# Patient Record
Sex: Female | Born: 1950 | Race: White | Hispanic: No | Marital: Single | Smoking: Never smoker
Health system: Southern US, Community
[De-identification: ages and names within clinical notes are randomized; demographics above are authoritative.]

## PROBLEM LIST (undated history)

## (undated) DIAGNOSIS — H269 Unspecified cataract: Secondary | ICD-10-CM

## (undated) DIAGNOSIS — IMO0002 Reserved for concepts with insufficient information to code with codable children: Secondary | ICD-10-CM

## (undated) DIAGNOSIS — E785 Hyperlipidemia, unspecified: Secondary | ICD-10-CM

## (undated) DIAGNOSIS — E079 Disorder of thyroid, unspecified: Secondary | ICD-10-CM

## (undated) DIAGNOSIS — Z85828 Personal history of other malignant neoplasm of skin: Secondary | ICD-10-CM

## (undated) DIAGNOSIS — C801 Malignant (primary) neoplasm, unspecified: Secondary | ICD-10-CM

## (undated) DIAGNOSIS — R112 Nausea with vomiting, unspecified: Secondary | ICD-10-CM

## (undated) DIAGNOSIS — I10 Essential (primary) hypertension: Secondary | ICD-10-CM

## (undated) DIAGNOSIS — K219 Gastro-esophageal reflux disease without esophagitis: Secondary | ICD-10-CM

## (undated) DIAGNOSIS — Z9889 Other specified postprocedural states: Secondary | ICD-10-CM

## (undated) HISTORY — DX: Hyperlipidemia, unspecified: E78.5

## (undated) HISTORY — DX: Unspecified cataract: H26.9

## (undated) HISTORY — DX: Malignant (primary) neoplasm, unspecified: C80.1

## (undated) HISTORY — PX: CHOLECYSTECTOMY: SHX55

## (undated) HISTORY — PX: EYE SURGERY: SHX253

## (undated) HISTORY — PX: KNEE SURGERY: SHX244

## (undated) HISTORY — DX: Personal history of other malignant neoplasm of skin: Z85.828

## (undated) HISTORY — DX: Reserved for concepts with insufficient information to code with codable children: IMO0002

## (undated) HISTORY — DX: Disorder of thyroid, unspecified: E07.9

---

## 1988-08-04 HISTORY — PX: SHOULDER SURGERY: SHX246

## 1998-08-04 HISTORY — PX: GALLBLADDER SURGERY: SHX652

## 2004-12-19 ENCOUNTER — Ambulatory Visit: Payer: Self-pay

## 2005-02-12 ENCOUNTER — Ambulatory Visit: Payer: Self-pay

## 2005-12-23 ENCOUNTER — Ambulatory Visit: Payer: Self-pay

## 2006-02-25 ENCOUNTER — Ambulatory Visit: Payer: Self-pay

## 2006-04-27 ENCOUNTER — Ambulatory Visit: Payer: Self-pay | Admitting: Gastroenterology

## 2006-12-23 ENCOUNTER — Ambulatory Visit: Payer: Self-pay

## 2007-03-01 ENCOUNTER — Ambulatory Visit: Payer: Self-pay

## 2008-03-02 ENCOUNTER — Ambulatory Visit: Payer: Self-pay | Admitting: Family Medicine

## 2008-03-07 ENCOUNTER — Ambulatory Visit: Payer: Self-pay | Admitting: Family Medicine

## 2008-09-05 ENCOUNTER — Ambulatory Visit: Payer: Self-pay | Admitting: Family Medicine

## 2008-10-05 ENCOUNTER — Ambulatory Visit: Payer: Self-pay | Admitting: Family Medicine

## 2008-11-20 ENCOUNTER — Ambulatory Visit: Payer: Self-pay | Admitting: Obstetrics and Gynecology

## 2008-12-01 ENCOUNTER — Ambulatory Visit: Payer: Self-pay | Admitting: General Practice

## 2008-12-29 ENCOUNTER — Ambulatory Visit: Payer: Self-pay | Admitting: General Practice

## 2009-01-18 ENCOUNTER — Encounter: Payer: Self-pay | Admitting: General Practice

## 2009-02-01 ENCOUNTER — Encounter: Payer: Self-pay | Admitting: General Practice

## 2009-03-04 ENCOUNTER — Encounter: Payer: Self-pay | Admitting: General Practice

## 2009-03-08 ENCOUNTER — Other Ambulatory Visit: Payer: Self-pay | Admitting: Family Medicine

## 2009-03-08 ENCOUNTER — Ambulatory Visit: Payer: Self-pay | Admitting: Family Medicine

## 2009-08-04 DIAGNOSIS — C801 Malignant (primary) neoplasm, unspecified: Secondary | ICD-10-CM

## 2009-08-04 HISTORY — PX: THYROIDECTOMY: SHX17

## 2009-08-04 HISTORY — DX: Malignant (primary) neoplasm, unspecified: C80.1

## 2009-09-08 ENCOUNTER — Other Ambulatory Visit: Payer: Self-pay | Admitting: Family Medicine

## 2009-09-10 ENCOUNTER — Ambulatory Visit: Payer: Self-pay | Admitting: Family Medicine

## 2009-10-09 ENCOUNTER — Other Ambulatory Visit: Payer: Self-pay | Admitting: Family Medicine

## 2009-10-17 ENCOUNTER — Other Ambulatory Visit: Payer: Self-pay | Admitting: Endocrinology

## 2009-11-16 ENCOUNTER — Other Ambulatory Visit: Payer: Self-pay | Admitting: Endocrinology

## 2009-12-19 ENCOUNTER — Ambulatory Visit: Payer: Self-pay | Admitting: Otolaryngology

## 2010-01-29 ENCOUNTER — Other Ambulatory Visit: Payer: Self-pay | Admitting: Endocrinology

## 2010-03-12 ENCOUNTER — Ambulatory Visit: Payer: Self-pay | Admitting: Family Medicine

## 2010-03-22 ENCOUNTER — Other Ambulatory Visit: Payer: Self-pay | Admitting: Family Medicine

## 2010-09-11 ENCOUNTER — Other Ambulatory Visit: Payer: Self-pay | Admitting: Family Medicine

## 2011-01-28 ENCOUNTER — Other Ambulatory Visit: Payer: Self-pay | Admitting: Endocrinology

## 2011-03-12 ENCOUNTER — Other Ambulatory Visit: Payer: Self-pay | Admitting: Family Medicine

## 2011-03-19 ENCOUNTER — Ambulatory Visit: Payer: Self-pay | Admitting: Family Medicine

## 2011-03-19 LAB — HM MAMMOGRAPHY: HM Mammogram: NORMAL

## 2011-05-22 ENCOUNTER — Other Ambulatory Visit: Payer: Self-pay | Admitting: Family Medicine

## 2011-10-18 LAB — HM PAP SMEAR: HM Pap smear: NORMAL

## 2011-11-25 ENCOUNTER — Other Ambulatory Visit: Payer: Self-pay | Admitting: Family Medicine

## 2011-11-25 LAB — COMPREHENSIVE METABOLIC PANEL
Albumin: 3.9 g/dL (ref 3.4–5.0)
Anion Gap: 7 (ref 7–16)
BUN: 17 mg/dL (ref 7–18)
Bilirubin,Total: 0.4 mg/dL (ref 0.2–1.0)
Chloride: 105 mmol/L (ref 98–107)
Co2: 29 mmol/L (ref 21–32)
Creatinine: 0.72 mg/dL (ref 0.60–1.30)
EGFR (African American): >60
EGFR (Non-African Amer.): >60
Glucose: 98 mg/dL (ref 65–99)
Osmolality: 283 (ref 275–301)
Total Protein: 7.2 g/dL (ref 6.4–8.2)

## 2011-11-25 LAB — LIPID PANEL
Cholesterol: 135 mg/dL (ref 0–200)
HDL Cholesterol: 40 mg/dL (ref 40–60)
Ldl Cholesterol, Calc: 73 mg/dL (ref 0–100)
Triglycerides: 109 mg/dL (ref 0–200)
VLDL Cholesterol, Calc: 22 mg/dL (ref 5–40)

## 2011-11-25 LAB — IRON AND TIBC
Iron Bind.Cap.(Total): 297 ug/dL (ref 250–450)
Iron Saturation: 30 %
Iron: 88 ug/dL (ref 50–170)
Unbound Iron-Bind.Cap.: 209 ug/dL

## 2011-11-25 LAB — CBC WITH DIFFERENTIAL/PLATELET
Basophil %: 0.8 %
Eosinophil #: 0.5 10*3/uL (ref 0.0–0.7)
Eosinophil %: 6 %
HGB: 13.8 g/dL (ref 12.0–16.0)
Monocyte #: 0.5 x10 3/mm (ref 0.2–0.9)
Monocyte %: 6.1 %
Neutrophil #: 4.6 10*3/uL (ref 1.4–6.5)
Neutrophil %: 57.4 %

## 2011-11-25 LAB — FOLATE: Folic Acid: 54.2 ng/mL (ref 3.1–100.0)

## 2012-01-22 ENCOUNTER — Other Ambulatory Visit: Payer: Self-pay | Admitting: Endocrinology

## 2012-01-22 LAB — TSH: Thyroid Stimulating Horm: 1.07 u[IU]/mL

## 2012-01-22 LAB — T4, FREE: Free Thyroxine: 0.95 ng/dL (ref 0.76–1.46)

## 2012-04-19 ENCOUNTER — Encounter: Payer: Self-pay | Admitting: Internal Medicine

## 2012-04-19 ENCOUNTER — Ambulatory Visit (INDEPENDENT_AMBULATORY_CARE_PROVIDER_SITE_OTHER): Payer: PRIVATE HEALTH INSURANCE | Admitting: Internal Medicine

## 2012-04-19 VITALS — BP 122/64 | HR 88 | Temp 99.1°F | Ht 63.0 in | Wt 142.5 lb

## 2012-04-19 DIAGNOSIS — K219 Gastro-esophageal reflux disease without esophagitis: Secondary | ICD-10-CM | POA: Insufficient documentation

## 2012-04-19 DIAGNOSIS — E785 Hyperlipidemia, unspecified: Secondary | ICD-10-CM | POA: Insufficient documentation

## 2012-04-19 DIAGNOSIS — E039 Hypothyroidism, unspecified: Secondary | ICD-10-CM

## 2012-04-19 MED ORDER — LANSOPRAZOLE 30 MG PO CPDR: 30.0000 mg | DELAYED_RELEASE_CAPSULE | Freq: Every day | ORAL | Status: DC

## 2012-04-19 MED ORDER — AMITRIPTYLINE HCL 50 MG PO TABS: 50.0000 mg | ORAL_TABLET | Freq: Every day | ORAL | Status: DC

## 2012-04-19 MED ORDER — ATORVASTATIN CALCIUM 10 MG PO TABS: 10.0000 mg | ORAL_TABLET | Freq: Every day | ORAL | Status: DC

## 2012-04-19 NOTE — Assessment & Plan Note (Signed)
Will check fasting lipids and LFTs with labs. Follow up 6 months and prn.

## 2012-04-19 NOTE — Assessment & Plan Note (Signed)
Symptoms well controlled on Prevacid. Will continue. Follow up 6 months and prn.

## 2012-04-19 NOTE — Progress Notes (Signed)
Subjective:    Patient ID: Erin [name], female    DOB: 1951/06/23, 61 y.o.   MRN: [id number]  HPI 61 year old female with history of hyperlipidemia and hypothyroidism presents to establish care. She reports she is generally feeling well. She reports full compliance with her medications. She notes that she is due for labs to check cholesterol and liver function. She has her thyroid function followed by her endocrinologist. She denies any new concerns today.   Outpatient Encounter Prescriptions as of 04/19/2012  Medication Sig Dispense Refill  . amitriptyline (ELAVIL) 50 MG tablet Take 1 tablet (50 mg total) by mouth at bedtime.  90 tablet  4  . atorvastatin (LIPITOR) 10 MG tablet Take 1 tablet (10 mg total) by mouth daily.  90 tablet  4  . calcium carbonate (OS-CAL) 600 MG TABS Take 600 mg by mouth 2 (two) times daily with a meal.      . cholecalciferol (VITAMIN D) 1000 UNITS tablet Take 1,000 Units by mouth daily.      . Cinnamon 500 MG capsule Take 500 mg by mouth daily.      . fish oil-omega-3 fatty acids 1000 MG capsule Take 2 g by mouth daily.      . lansoprazole (PREVACID) 30 MG capsule Take 1 capsule (30 mg total) by mouth daily.  90 capsule  4  . levothyroxine (SYNTHROID, LEVOTHROID) 88 MCG tablet Take 88 mcg by mouth daily.      . Multiple Vitamins-Minerals (CENTRUM SILVER PO) Take by mouth.       BP 122/64  Pulse 88  Temp 99.1 F (37.3 C) (Oral)  Ht 5\' 3"  (1.6 m)  Wt 142 lb 8 oz (64.638 kg)  BMI 25.24 kg/m2  SpO2 97%  Review of Systems  Constitutional: Negative for fever, chills, appetite change, fatigue and unexpected weight change.  HENT: Negative for ear pain, congestion, sore throat, trouble swallowing, neck pain, voice change and sinus pressure.   Eyes: Negative for visual disturbance.  Respiratory: Negative for cough, shortness of breath, wheezing and stridor.   Cardiovascular: Negative for chest pain, palpitations and leg swelling.  Gastrointestinal: Negative for  nausea, vomiting, abdominal pain, diarrhea, constipation, blood in stool, abdominal distention and anal bleeding.  Genitourinary: Negative for dysuria and flank pain.  Musculoskeletal: Negative for myalgias, arthralgias and gait problem.  Skin: Negative for color change and rash.  Neurological: Negative for dizziness and headaches.  Hematological: Negative for adenopathy. Does not bruise/bleed easily.  Psychiatric/Behavioral: Negative for suicidal ideas, disturbed wake/sleep cycle and dysphoric mood. The patient is not nervous/anxious.        Objective:   Physical Exam  Constitutional: She is oriented to person, place, and time. She appears well-developed and well-nourished. No distress.  HENT:  Head: Normocephalic and atraumatic.  Right Ear: External ear normal.  Left Ear: External ear normal.  Nose: Nose normal.  Mouth/Throat: Oropharynx is clear and moist. No oropharyngeal exudate.  Eyes: Conjunctivae normal are normal. Pupils are equal, round, and reactive to light. Right eye exhibits no discharge. Left eye exhibits no discharge. No scleral icterus.  Neck: Normal range of motion. Neck supple. No tracheal deviation present. No thyromegaly present.  Cardiovascular: Normal rate, regular rhythm, normal heart sounds and intact distal pulses.  Exam reveals no gallop and no friction rub.   No murmur heard. Pulmonary/Chest: Effort normal and breath sounds normal. No respiratory distress. She has no wheezes. She has no rales. She exhibits no tenderness.  Musculoskeletal: Normal range of motion. She  exhibits no edema and no tenderness.  Lymphadenopathy:    She has no cervical adenopathy.  Neurological: She is alert and oriented to person, place, and time. No cranial nerve deficit. She exhibits normal muscle tone. Coordination normal.  Skin: Skin is warm and dry. No rash noted. She is not diaphoretic. No erythema. No pallor.  Psychiatric: She has a normal mood and affect. Her behavior is  normal. Judgment and thought content normal.          Assessment & Plan:

## 2012-04-19 NOTE — Assessment & Plan Note (Signed)
Will check TSH with labs. Continue levothyroxine. 

## 2012-04-20 ENCOUNTER — Other Ambulatory Visit: Payer: Self-pay | Admitting: Internal Medicine

## 2012-04-20 LAB — CBC WITH DIFFERENTIAL/PLATELET
Basophil %: 0.9 %
Eosinophil %: 4.8 %
HCT: 40.9 % (ref 35.0–47.0)
HGB: 14.2 g/dL (ref 12.0–16.0)
Lymphocyte #: 2.5 10*3/uL (ref 1.0–3.6)
MCH: 31.3 pg (ref 26.0–34.0)
MCV: 90 fL (ref 80–100)
Monocyte #: 0.6 x10 3/mm (ref 0.2–0.9)
Monocyte %: 7 %
Neutrophil #: 4.9 10*3/uL (ref 1.4–6.5)
RBC: 4.52 10*6/uL (ref 3.80–5.20)
WBC: 8.4 10*3/uL (ref 3.6–11.0)

## 2012-04-20 LAB — COMPREHENSIVE METABOLIC PANEL
Alkaline Phosphatase: 76 U/L (ref 50–136)
Anion Gap: 5 — ABNORMAL LOW (ref 7–16)
BUN: 17 mg/dL (ref 7–18)
Bilirubin,Total: 0.4 mg/dL (ref 0.2–1.0)
Chloride: 106 mmol/L (ref 98–107)
Co2: 30 mmol/L (ref 21–32)
EGFR (African American): >60
Glucose: 104 mg/dL — ABNORMAL HIGH (ref 65–99)
Osmolality: 283 (ref 275–301)
SGPT (ALT): 44 U/L (ref 12–78)
Total Protein: 7.4 g/dL (ref 6.4–8.2)

## 2012-04-20 LAB — TSH: Thyroid Stimulating Horm: 1.49 u[IU]/mL

## 2012-04-21 ENCOUNTER — Telehealth: Payer: Self-pay | Admitting: Internal Medicine

## 2012-04-21 NOTE — Telephone Encounter (Signed)
Remainder of labs were normal including blood counts, kidney and liver function and cholesterol

## 2012-04-21 NOTE — Telephone Encounter (Signed)
Left message on cell phone voicemail for patient to return call. 

## 2012-04-21 NOTE — Telephone Encounter (Signed)
Vit D and thyroid function were normal (from [facility])

## 2012-04-22 NOTE — Telephone Encounter (Signed)
Patient advised as instructed via telephone. 

## 2012-04-22 NOTE — Telephone Encounter (Signed)
Left message on machine at home and on cell phone voicemail for patient to return call. 

## 2012-04-23 ENCOUNTER — Encounter: Payer: Self-pay | Admitting: Internal Medicine

## 2012-04-26 ENCOUNTER — Ambulatory Visit: Payer: Self-pay | Admitting: Internal Medicine

## 2012-04-27 ENCOUNTER — Encounter: Payer: Self-pay | Admitting: Internal Medicine

## 2012-04-27 ENCOUNTER — Ambulatory Visit (INDEPENDENT_AMBULATORY_CARE_PROVIDER_SITE_OTHER): Payer: PRIVATE HEALTH INSURANCE | Admitting: Internal Medicine

## 2012-04-27 VITALS — BP 110/82 | HR 76 | Temp 97.6°F | Ht 63.0 in | Wt 139.0 lb

## 2012-04-27 DIAGNOSIS — B029 Zoster without complications: Secondary | ICD-10-CM

## 2012-04-27 MED ORDER — VALACYCLOVIR HCL 1 G PO TABS: 1000.0000 mg | ORAL_TABLET | Freq: Three times a day (TID) | ORAL | Status: DC

## 2012-04-27 MED ORDER — GABAPENTIN 100 MG PO CAPS: 100.0000 mg | ORAL_CAPSULE | Freq: Three times a day (TID) | ORAL | Status: DC

## 2012-04-27 MED ORDER — OXYCODONE-ACETAMINOPHEN 5-325 MG PO TABS: 1.0000 | ORAL_TABLET | Freq: Three times a day (TID) | ORAL | Status: DC | PRN

## 2012-04-27 NOTE — Progress Notes (Signed)
  Subjective:    Patient ID: Erin [name], female    DOB: 14-Jul-1951, 61 y.o.   MRN: [id number]  HPI 61YO female presents for acute visit for painful rash. Rash started over left lateral chest wall, extending around to nipple this morning. Pt notes some discomfort in this area prior to formation of rash. Rash is tender with vesicles that are oozing. No fever, chills, cough or other symptoms. Took Tylenol last night with relief of pain.  Outpatient Encounter Prescriptions as of 04/27/2012  Medication Sig Dispense Refill  . amitriptyline (ELAVIL) 50 MG tablet Take 1 tablet (50 mg total) by mouth at bedtime.  90 tablet  4  . atorvastatin (LIPITOR) 10 MG tablet Take 1 tablet (10 mg total) by mouth daily.  90 tablet  4  . calcium carbonate (OS-CAL) 600 MG TABS Take 600 mg by mouth 2 (two) times daily with a meal.      . cholecalciferol (VITAMIN D) 1000 UNITS tablet Take 1,000 Units by mouth daily.      . Cinnamon 500 MG capsule Take 500 mg by mouth daily.      . fish oil-omega-3 fatty acids 1000 MG capsule Take 2 g by mouth daily.      . lansoprazole (PREVACID) 30 MG capsule Take 1 capsule (30 mg total) by mouth daily.  90 capsule  4  . levothyroxine (SYNTHROID, LEVOTHROID) 88 MCG tablet Take 88 mcg by mouth daily. Brand name      . Multiple Vitamins-Minerals (CENTRUM SILVER PO) Take by mouth.      . gabapentin (NEURONTIN) 100 MG capsule Take 1 capsule (100 mg total) by mouth 3 (three) times daily.  90 capsule  3  . oxyCODONE-acetaminophen (ROXICET) 5-325 MG per tablet Take 1 tablet by mouth every 8 (eight) hours as needed for pain.  60 tablet  0  . valACYclovir (VALTREX) 1000 MG tablet Take 1 tablet (1,000 mg total) by mouth 3 (three) times daily.  21 tablet  0   BP 110/82  Pulse 76  Temp 97.6 F (36.4 C) (Oral)  Ht 5\' 3"  (1.6 m)  Wt 139 lb (63.05 kg)  BMI 24.62 kg/m2  SpO2 93%  Review of Systems  Constitutional: Negative for fever, chills and fatigue.  Musculoskeletal: Positive for  myalgias.  Skin: Positive for color change and rash.       Objective:   Physical Exam  Constitutional: She appears well-developed and well-nourished.  HENT:  Head: Normocephalic.  Eyes: Pupils are equal, round, and reactive to light.  Neck: Normal range of motion.  Pulmonary/Chest: Effort normal.    Skin: Rash noted. Rash is vesicular. She is not diaphoretic.          Assessment & Plan:

## 2012-04-27 NOTE — Patient Instructions (Signed)

## 2012-04-27 NOTE — Assessment & Plan Note (Signed)
Exam consistent with shingles. Will start Valtrex. Will use neurontin as needed for pain started with 100mg  at bedtime, then planning to titrate up as needed. Will use Percocet as needed for severe pain. Follow up 1 week and as needed.

## 2012-04-29 ENCOUNTER — Encounter: Payer: Self-pay | Admitting: Internal Medicine

## 2012-05-04 ENCOUNTER — Ambulatory Visit (INDEPENDENT_AMBULATORY_CARE_PROVIDER_SITE_OTHER): Payer: PRIVATE HEALTH INSURANCE | Admitting: Internal Medicine

## 2012-05-04 ENCOUNTER — Telehealth: Payer: Self-pay | Admitting: Internal Medicine

## 2012-05-04 ENCOUNTER — Encounter: Payer: Self-pay | Admitting: Internal Medicine

## 2012-05-04 VITALS — BP 110/68 | HR 87 | Temp 98.7°F | Ht 63.0 in | Wt 141.0 lb

## 2012-05-04 DIAGNOSIS — B029 Zoster without complications: Secondary | ICD-10-CM

## 2012-05-04 NOTE — Progress Notes (Signed)
  Subjective:    Patient ID: Erin [name], female    DOB: 09/12/1950, 61 y.o.   MRN: [id number]  HPI 61 year old female presents for followup after recent shingles outbreak on her left lateral chest. She reports that lesions have begun to crust over. She reports that pain has been significant and she is taking Neurontin 100 mg at bedtime and occasionally using oxycodone for breakthrough pain with some improvement. She has been out of work because her work does not allow her to return to all lesions are crusted. She requests FMLA paperwork to be filled out today. She denies any recent fever, chills, or other symptoms.  Outpatient Encounter Prescriptions as of 05/04/2012  Medication Sig Dispense Refill  . amitriptyline (ELAVIL) 50 MG tablet Take 1 tablet (50 mg total) by mouth at bedtime.  90 tablet  4  . atorvastatin (LIPITOR) 10 MG tablet Take 1 tablet (10 mg total) by mouth daily.  90 tablet  4  . calcium carbonate (OS-CAL) 600 MG TABS Take 600 mg by mouth 2 (two) times daily with a meal.      . cholecalciferol (VITAMIN D) 1000 UNITS tablet Take 1,000 Units by mouth daily.      . Cinnamon 500 MG capsule Take 500 mg by mouth daily.      . fish oil-omega-3 fatty acids 1000 MG capsule Take 2 g by mouth daily.      Marland Kitchen gabapentin (NEURONTIN) 100 MG capsule Take 1 capsule (100 mg total) by mouth 3 (three) times daily.  90 capsule  3  . lansoprazole (PREVACID) 30 MG capsule Take 1 capsule (30 mg total) by mouth daily.  90 capsule  4  . levothyroxine (SYNTHROID, LEVOTHROID) 88 MCG tablet Take 88 mcg by mouth daily. Brand name      . Multiple Vitamins-Minerals (CENTRUM SILVER PO) Take by mouth.      . oxyCODONE-acetaminophen (ROXICET) 5-325 MG per tablet Take 1 tablet by mouth every 8 (eight) hours as needed for pain.  60 tablet  0  . valACYclovir (VALTREX) 1000 MG tablet Take 1 tablet (1,000 mg total) by mouth 3 (three) times daily.  21 tablet  0   BP 110/68  Pulse 87  Temp 98.7 F (37.1 C) (Oral)   Ht 5\' 3"  (1.6 m)  Wt 141 lb (63.957 kg)  BMI 24.98 kg/m2  SpO2 94%  Review of Systems  Constitutional: Negative for fever, chills and fatigue.  Skin: Positive for color change and rash.       Objective:   Physical Exam  Constitutional: She is oriented to person, place, and time. She appears well-developed and well-nourished. No distress.  HENT:  Head: Normocephalic.  Eyes: Pupils are equal, round, and reactive to light.  Neck: Normal range of motion.  Neurological: She is alert and oriented to person, place, and time. She has normal reflexes.  Skin: Skin is warm. Rash noted. Rash is vesicular. She is not diaphoretic. There is erythema.             Assessment & Plan:

## 2012-05-04 NOTE — Telephone Encounter (Signed)
Pt aware that her paperwork is ready to be picked up  fmla paper work

## 2012-05-04 NOTE — Assessment & Plan Note (Signed)
Herpes zoster outbreak improving, lesions are beginning to crust over. Filled out FMLA paperwork for patient today. She may return to work at the lesions are crusted on 05/10/2012. She will increase Neurontin to 100 mg 3 times daily to help better control pain. She will continue to use oxycodone as needed for severe pain. She will followup in 4 weeks or sooner as needed.

## 2012-05-11 ENCOUNTER — Encounter: Payer: Self-pay | Admitting: Internal Medicine

## 2012-09-18 ENCOUNTER — Other Ambulatory Visit: Payer: Self-pay

## 2012-10-20 ENCOUNTER — Encounter: Payer: Self-pay | Admitting: Internal Medicine

## 2012-10-20 ENCOUNTER — Ambulatory Visit (INDEPENDENT_AMBULATORY_CARE_PROVIDER_SITE_OTHER): Payer: 59 | Admitting: Internal Medicine

## 2012-10-20 ENCOUNTER — Other Ambulatory Visit (HOSPITAL_COMMUNITY)
Admission: RE | Admit: 2012-10-20 | Discharge: 2012-10-20 | Disposition: A | Discharge status: Home / Self Care | Payer: 59 | Source: Ambulatory Visit | Attending: Internal Medicine | Admitting: Internal Medicine

## 2012-10-20 VITALS — BP 100/70 | HR 94 | Temp 98.6°F | Ht 62.0 in | Wt 140.0 lb

## 2012-10-20 DIAGNOSIS — K219 Gastro-esophageal reflux disease without esophagitis: Secondary | ICD-10-CM

## 2012-10-20 DIAGNOSIS — Z0000 Encounter for general adult medical examination without abnormal findings: Secondary | ICD-10-CM | POA: Insufficient documentation

## 2012-10-20 DIAGNOSIS — E785 Hyperlipidemia, unspecified: Secondary | ICD-10-CM

## 2012-10-20 DIAGNOSIS — Z1151 Encounter for screening for human papillomavirus (HPV): Secondary | ICD-10-CM | POA: Insufficient documentation

## 2012-10-20 MED ORDER — ATORVASTATIN CALCIUM 10 MG PO TABS: 10.0000 mg | ORAL_TABLET | Freq: Every day | ORAL | Status: DC

## 2012-10-20 MED ORDER — LANSOPRAZOLE 30 MG PO CPDR: 30.0000 mg | DELAYED_RELEASE_CAPSULE | Freq: Every day | ORAL | Status: DC

## 2012-10-20 MED ORDER — AMITRIPTYLINE HCL 50 MG PO TABS: 50.0000 mg | ORAL_TABLET | Freq: Every day | ORAL | Status: DC

## 2012-10-20 NOTE — Assessment & Plan Note (Signed)
General medical exam normal today including breast exam. Pelvic exam remarkable for atrophic vaginitis. PAP pending. Will check basic labs including CMP, lipids, CBC today. Mammogram and bone density testing to be scheduled. Follow up 6 months and prn.

## 2012-10-20 NOTE — Progress Notes (Signed)
Subjective:    Patient ID: Erin [name], female    DOB: June 27, 1951, 62 y.o.   MRN: [id number]  HPI 62YO female presents for annual exam. Doing well. No concerns today. Compliant with medications. Tries to follow healthy diet and get regular physical activity.   Outpatient Encounter Prescriptions as of 10/20/2012  Medication Sig Dispense Refill  . amitriptyline (ELAVIL) 50 MG tablet Take 1 tablet (50 mg total) by mouth at bedtime.  90 tablet  4  . atorvastatin (LIPITOR) 10 MG tablet Take 1 tablet (10 mg total) by mouth daily.  90 tablet  4  . cholecalciferol (VITAMIN D) 1000 UNITS tablet Take 1,000 Units by mouth daily.      . Cinnamon 500 MG capsule Take 500 mg by mouth daily.      . fish oil-omega-3 fatty acids 1000 MG capsule Take 2 g by mouth daily.      . lansoprazole (PREVACID) 30 MG capsule Take 1 capsule (30 mg total) by mouth daily.  90 capsule  4  . levothyroxine (SYNTHROID, LEVOTHROID) 88 MCG tablet Take 88 mcg by mouth daily. Brand name      . Multiple Vitamins-Minerals (CENTRUM SILVER PO) Take by mouth.       No facility-administered encounter medications on file as of 10/20/2012.   BP 100/70  Pulse 94  Temp(Src) 98.6 F (37 C) (Oral)  Ht 5\' 2"  (1.575 m)  Wt 140 lb (63.504 kg)  BMI 25.6 kg/m2  SpO2 95%  Review of Systems  Constitutional: Negative for fever, chills, appetite change, fatigue and unexpected weight change.  HENT: Negative for ear pain, congestion, sore throat, trouble swallowing, neck pain, voice change and sinus pressure.   Eyes: Negative for visual disturbance.  Respiratory: Negative for cough, shortness of breath, wheezing and stridor.   Cardiovascular: Negative for chest pain, palpitations and leg swelling.  Gastrointestinal: Negative for nausea, vomiting, abdominal pain, diarrhea, constipation, blood in stool, abdominal distention and anal bleeding.  Genitourinary: Negative for dysuria and flank pain.  Musculoskeletal: Negative for myalgias,  arthralgias and gait problem.  Skin: Negative for color change and rash.  Neurological: Negative for dizziness and headaches.  Hematological: Negative for adenopathy. Does not bruise/bleed easily.  Psychiatric/Behavioral: Negative for suicidal ideas, sleep disturbance and dysphoric mood. The patient is not nervous/anxious.        Objective:   Physical Exam  Constitutional: She is oriented to person, place, and time. She appears well-developed and well-nourished. No distress.  HENT:  Head: Normocephalic and atraumatic.  Right Ear: External ear normal.  Left Ear: External ear normal.  Nose: Nose normal.  Mouth/Throat: Oropharynx is clear and moist. No oropharyngeal exudate.  Eyes: Conjunctivae are normal. Pupils are equal, round, and reactive to light. Right eye exhibits no discharge. Left eye exhibits no discharge. No scleral icterus.  Neck: Normal range of motion. Neck supple. No tracheal deviation present. No thyromegaly present.  Cardiovascular: Normal rate, regular rhythm, normal heart sounds and intact distal pulses.  Exam reveals no gallop and no friction rub.   No murmur heard. Pulmonary/Chest: Effort normal and breath sounds normal. No respiratory distress. She has no wheezes. She has no rales. She exhibits no tenderness.  Abdominal: Soft. Bowel sounds are normal. She exhibits no distension and no mass. There is no tenderness. There is no rebound and no guarding.  Genitourinary: Rectum normal and uterus normal. No breast swelling, tenderness, discharge or bleeding. Pelvic exam was performed with patient supine. There is no rash, tenderness or lesion  on the right labia. There is no rash, tenderness or lesion on the left labia. Uterus is not enlarged and not tender. Cervix exhibits no motion tenderness, no discharge and no friability. Right adnexum displays no mass, no tenderness and no fullness. Left adnexum displays no mass, no tenderness and no fullness. There is erythema (limited exam  because of pt discomfort wtih speculum, atrophy) around the vagina. No tenderness around the vagina. No vaginal discharge found.  Musculoskeletal: Normal range of motion. She exhibits no edema and no tenderness.  Lymphadenopathy:    She has no cervical adenopathy.  Neurological: She is alert and oriented to person, place, and time. No cranial nerve deficit. She exhibits normal muscle tone. Coordination normal.  Skin: Skin is warm and dry. No rash noted. She is not diaphoretic. No erythema. No pallor.  Psychiatric: She has a normal mood and affect. Her behavior is normal. Judgment and thought content normal.          Assessment & Plan:

## 2012-10-21 LAB — COMPREHENSIVE METABOLIC PANEL
ALT: 31 U/L (ref 0–35)
Albumin: 4.2 g/dL (ref 3.5–5.2)
CO2: 26 mEq/L (ref 19–32)
Chloride: 105 mEq/L (ref 96–112)
GFR: 79.92 mL/min (ref >60.00)
Glucose, Bld: 139 mg/dL — ABNORMAL HIGH (ref 70–99)
Potassium: 4.1 mEq/L (ref 3.5–5.1)
Sodium: 140 mEq/L (ref 135–145)
Total Bilirubin: 0.5 mg/dL (ref 0.3–1.2)
Total Protein: 7 g/dL (ref 6.0–8.3)

## 2012-10-21 LAB — CBC WITH DIFFERENTIAL/PLATELET
Basophils Relative: 0.5 % (ref 0.0–3.0)
Eosinophils Relative: 3.3 % (ref 0.0–5.0)
HCT: 39.8 % (ref 36.0–46.0)
Hemoglobin: 13.6 g/dL (ref 12.0–15.0)
Lymphs Abs: 2.5 10*3/uL (ref 0.7–4.0)
MCHC: 34.2 g/dL (ref 30.0–36.0)
MCV: 89.9 fl (ref 78.0–100.0)
Monocytes Absolute: 0.5 10*3/uL (ref 0.1–1.0)
Neutro Abs: 5.1 10*3/uL (ref 1.4–7.7)
RBC: 4.42 Mil/uL (ref 3.87–5.11)
WBC: 8.5 10*3/uL (ref 4.5–10.5)

## 2012-10-21 LAB — VITAMIN D 25 HYDROXY (VIT D DEFICIENCY, FRACTURES): Vit D, 25-Hydroxy: 60 ng/mL (ref 30–89)

## 2012-10-21 LAB — LIPID PANEL: VLDL: 34.2 mg/dL (ref 0.0–40.0)

## 2012-10-21 LAB — MICROALBUMIN / CREATININE URINE RATIO: Creatinine,U: 65.3 mg/dL

## 2012-10-22 ENCOUNTER — Ambulatory Visit: Payer: 59

## 2012-10-22 DIAGNOSIS — Z0000 Encounter for general adult medical examination without abnormal findings: Secondary | ICD-10-CM

## 2012-11-26 ENCOUNTER — Ambulatory Visit: Payer: Self-pay | Admitting: General Practice

## 2012-12-13 ENCOUNTER — Encounter: Payer: Self-pay | Admitting: Internal Medicine

## 2013-04-25 ENCOUNTER — Ambulatory Visit: Payer: 59 | Admitting: Internal Medicine

## 2013-04-25 ENCOUNTER — Ambulatory Visit (INDEPENDENT_AMBULATORY_CARE_PROVIDER_SITE_OTHER): Payer: 59 | Admitting: Internal Medicine

## 2013-04-25 ENCOUNTER — Encounter: Payer: Self-pay | Admitting: Internal Medicine

## 2013-04-25 VITALS — BP 120/80 | HR 86 | Temp 98.8°F | Ht 62.0 in | Wt 142.5 lb

## 2013-04-25 DIAGNOSIS — K219 Gastro-esophageal reflux disease without esophagitis: Secondary | ICD-10-CM

## 2013-04-25 DIAGNOSIS — J069 Acute upper respiratory infection, unspecified: Secondary | ICD-10-CM | POA: Insufficient documentation

## 2013-04-25 DIAGNOSIS — E039 Hypothyroidism, unspecified: Secondary | ICD-10-CM

## 2013-04-25 DIAGNOSIS — E785 Hyperlipidemia, unspecified: Secondary | ICD-10-CM

## 2013-04-25 DIAGNOSIS — Z124 Encounter for screening for malignant neoplasm of cervix: Secondary | ICD-10-CM | POA: Insufficient documentation

## 2013-04-25 LAB — COMPREHENSIVE METABOLIC PANEL
ALT: 30 U/L (ref 0–35)
Albumin: 4.2 g/dL (ref 3.5–5.2)
Alkaline Phosphatase: 70 U/L (ref 39–117)
CO2: 30 mEq/L (ref 19–32)
Calcium: 9.2 mg/dL (ref 8.4–10.5)
Chloride: 106 mEq/L (ref 96–112)
Creatinine, Ser: 0.7 mg/dL (ref 0.4–1.2)
GFR: 90.4 mL/min (ref >60.00)
Potassium: 3.9 mEq/L (ref 3.5–5.1)
Total Protein: 7.5 g/dL (ref 6.0–8.3)

## 2013-04-25 LAB — LIPID PANEL
Total CHOL/HDL Ratio: 4
Triglycerides: 127 mg/dL (ref 0.0–149.0)

## 2013-04-25 MED ORDER — ATORVASTATIN CALCIUM 10 MG PO TABS: 10.0000 mg | ORAL_TABLET | Freq: Every day | ORAL | Status: DC

## 2013-04-25 MED ORDER — AMITRIPTYLINE HCL 50 MG PO TABS: 50.0000 mg | ORAL_TABLET | Freq: Every day | ORAL | Status: DC

## 2013-04-25 MED ORDER — LANSOPRAZOLE 30 MG PO CPDR: 30.0000 mg | DELAYED_RELEASE_CAPSULE | Freq: Every day | ORAL | Status: DC

## 2013-04-25 NOTE — Assessment & Plan Note (Signed)
Symptomatically doing well. Will check TSH with labs. Continue levothyroxine. 

## 2013-04-25 NOTE — Progress Notes (Signed)
Subjective:    Patient ID: Erin [name], female    DOB: July 08, 1951, 62 y.o.   MRN: [id number]  HPI 62YO female with h/o hyperlipidemia, GERD, hypothyroidism presents for follow up. Only concern today is recent symptoms of nasal congestion, hoarseness, sore throat and non-productive cough over last 3-4 days. No fever, chills, dyspnea, chest pain. Otherwise doing well.  Outpatient Encounter Prescriptions as of 04/25/2013  Medication Sig Dispense Refill  . amitriptyline (ELAVIL) 50 MG tablet Take 1 tablet (50 mg total) by mouth at bedtime.  90 tablet  4  . atorvastatin (LIPITOR) 10 MG tablet Take 1 tablet (10 mg total) by mouth daily.  90 tablet  4  . cholecalciferol (VITAMIN D) 1000 UNITS tablet Take 1,000 Units by mouth daily.      . Cinnamon 500 MG capsule Take 500 mg by mouth daily.      . fish oil-omega-3 fatty acids 1000 MG capsule Take 2 g by mouth daily.      . lansoprazole (PREVACID) 30 MG capsule Take 1 capsule (30 mg total) by mouth daily.  90 capsule  4  . levothyroxine (SYNTHROID, LEVOTHROID) 88 MCG tablet Take 88 mcg by mouth daily. Brand name      . Multiple Vitamins-Minerals (CENTRUM SILVER PO) Take by mouth.       No facility-administered encounter medications on file as of 04/25/2013.   BP 120/80  Pulse 86  Temp(Src) 98.8 F (37.1 C) (Oral)  Ht 5\' 2"  (1.575 m)  Wt 142 lb 8 oz (64.638 kg)  BMI 26.06 kg/m2  SpO2 99%  Review of Systems  Constitutional: Negative for fever, chills, appetite change, fatigue and unexpected weight change.  HENT: Positive for congestion, rhinorrhea and postnasal drip. Negative for ear pain, sore throat, trouble swallowing, neck pain, voice change and sinus pressure.   Eyes: Negative for visual disturbance.  Respiratory: Positive for cough. Negative for shortness of breath, wheezing and stridor.   Cardiovascular: Negative for chest pain, palpitations and leg swelling.  Gastrointestinal: Negative for nausea, vomiting, abdominal pain, diarrhea,  constipation, blood in stool, abdominal distention and anal bleeding.  Genitourinary: Negative for dysuria and flank pain.  Musculoskeletal: Negative for myalgias, arthralgias and gait problem.  Skin: Negative for color change and rash.  Neurological: Negative for dizziness and headaches.  Hematological: Negative for adenopathy. Does not bruise/bleed easily.  Psychiatric/Behavioral: Negative for suicidal ideas, sleep disturbance and dysphoric mood. The patient is not nervous/anxious.        Objective:   Physical Exam  Constitutional: She is oriented to person, place, and time. She appears well-developed and well-nourished. No distress.  HENT:  Head: Normocephalic and atraumatic.  Right Ear: External ear normal. Tympanic membrane is not erythematous and not bulging. A middle ear effusion is present.  Left Ear: External ear normal. Tympanic membrane is erythematous. Tympanic membrane is not bulging. A middle ear effusion is present.  Nose: Nose normal.  Mouth/Throat: Oropharynx is clear and moist. No oropharyngeal exudate.  Eyes: Conjunctivae are normal. Pupils are equal, round, and reactive to light. Right eye exhibits no discharge. Left eye exhibits no discharge. No scleral icterus.  Neck: Normal range of motion. Neck supple. No tracheal deviation present. No thyromegaly present.  Cardiovascular: Normal rate, regular rhythm, normal heart sounds and intact distal pulses.  Exam reveals no gallop and no friction rub.   No murmur heard. Pulmonary/Chest: Effort normal and breath sounds normal. No accessory muscle usage. Not tachypneic. No respiratory distress. She has no decreased breath sounds.  She has no wheezes. She has no rhonchi. She has no rales. She exhibits no tenderness.  Musculoskeletal: Normal range of motion. She exhibits no edema and no tenderness.  Lymphadenopathy:    She has no cervical adenopathy.  Neurological: She is alert and oriented to person, place, and time. No cranial  nerve deficit. She exhibits normal muscle tone. Coordination normal.  Skin: Skin is warm and dry. No rash noted. She is not diaphoretic. No erythema. No pallor.  Psychiatric: She has a normal mood and affect. Her behavior is normal. Judgment and thought content normal.          Assessment & Plan:

## 2013-04-25 NOTE — Assessment & Plan Note (Signed)
Recent symptoms of congestion, non-productive cough consistent with viral URI. Pt will continue rest and prn tylenol, decongestants. If any fever, worsening symptoms, or ear pain pt will email or RTC.

## 2013-04-25 NOTE — Assessment & Plan Note (Signed)
Will check lipids and LFTs with labs today. Continue Atorvastatin. 

## 2013-04-25 NOTE — Assessment & Plan Note (Signed)
Last PAP specimen had insufficient sample. Pt has atrophic vaginitis and PAP last visit was very uncomfortable for her. We discussed referral to GYN for possible PAP under mild sedation. Will set up referral to Dr. Toya Smothers, as she has seen her in the past.

## 2013-04-25 NOTE — Assessment & Plan Note (Signed)
Symptoms well controlled with Prevacid. Will continue.

## 2013-04-27 ENCOUNTER — Ambulatory Visit: Payer: Self-pay | Admitting: Internal Medicine

## 2013-05-02 ENCOUNTER — Telehealth: Payer: Self-pay | Admitting: Internal Medicine

## 2013-05-02 NOTE — Telephone Encounter (Signed)
Bone density testing showed osteopenia with T-score -1.7. Please schedule pt a visit to discuss and review results.

## 2013-05-02 NOTE — Telephone Encounter (Signed)
Left message to call back  

## 2013-05-03 NOTE — Telephone Encounter (Signed)
Patient informed and appointment scheduled.  

## 2013-05-03 NOTE — Telephone Encounter (Signed)
Left message to call back  

## 2013-05-09 ENCOUNTER — Encounter: Payer: Self-pay | Admitting: Internal Medicine

## 2013-05-25 ENCOUNTER — Ambulatory Visit: Payer: 59 | Admitting: Internal Medicine

## 2013-05-26 LAB — HM PAP SMEAR: HM Pap smear: NORMAL

## 2013-06-07 ENCOUNTER — Encounter: Payer: Self-pay | Admitting: *Deleted

## 2013-06-08 ENCOUNTER — Ambulatory Visit (INDEPENDENT_AMBULATORY_CARE_PROVIDER_SITE_OTHER): Payer: 59 | Admitting: Internal Medicine

## 2013-06-08 ENCOUNTER — Encounter: Payer: Self-pay | Admitting: Internal Medicine

## 2013-06-08 VITALS — BP 100/68 | HR 85 | Temp 98.5°F | Wt 144.0 lb

## 2013-06-08 DIAGNOSIS — M899 Disorder of bone, unspecified: Secondary | ICD-10-CM

## 2013-06-08 DIAGNOSIS — M8580 Other specified disorders of bone density and structure, unspecified site: Secondary | ICD-10-CM | POA: Insufficient documentation

## 2013-06-08 MED ORDER — ALENDRONATE SODIUM 70 MG PO TABS: 70.0000 mg | ORAL_TABLET | ORAL | Status: DC

## 2013-06-08 NOTE — Assessment & Plan Note (Signed)
Reviewed recent bone density testing results. Discussed potential treatment options including adequate dietary calcium, supplemental vitamin D, bisphosphonates, and increase physical activity. We also discussed the potential side effects from bisphosphonate. We'll plan to start Fosamax 70 mg weekly. Patient will call if any problems with this medication. Plan to recheck bone density testing in 2016.

## 2013-06-08 NOTE — Progress Notes (Signed)
Pre-visit discussion using our clinic review tool. No additional management support is needed unless otherwise documented below in the visit note.  

## 2013-06-08 NOTE — Progress Notes (Signed)
Subjective:    Patient ID: Erin [name], female    DOB: 1950-11-02, 62 y.o.   MRN: [id number]  HPI 62 year old female with history of hypothyroidism and hyperlipidemia presents to followup recent bone density testing. Bone density testing showed T score of -1.7. Lowest scores were left hip at -1.7 and right hip of -1.4. She has never previously been diagnosed with osteopenia or osteoporosis. She has never had fracture. Her mother did have osteoporosis. She is physically active and follows a healthy diet. She currently takes a calcium and vitamin D supplement. Recent vitamin D testing showed a level of 60.  Outpatient Prescriptions Prior to Visit  Medication Sig Dispense Refill  . amitriptyline (ELAVIL) 50 MG tablet Take 1 tablet (50 mg total) by mouth at bedtime.  90 tablet  4  . atorvastatin (LIPITOR) 10 MG tablet Take 1 tablet (10 mg total) by mouth daily.  90 tablet  4  . cholecalciferol (VITAMIN D) 1000 UNITS tablet Take 1,000 Units by mouth daily.      . Cinnamon 500 MG capsule Take 500 mg by mouth daily.      . lansoprazole (PREVACID) 30 MG capsule Take 1 capsule (30 mg total) by mouth daily.  90 capsule  4  . levothyroxine (SYNTHROID, LEVOTHROID) 88 MCG tablet Take 88 mcg by mouth daily. Brand name      . Multiple Vitamins-Minerals (CENTRUM SILVER PO) Take by mouth.      . fish oil-omega-3 fatty acids 1000 MG capsule Take 2 g by mouth daily.       No facility-administered medications prior to visit.   BP 100/68  Pulse 85  Temp(Src) 98.5 F (36.9 C) (Oral)  Wt 144 lb (65.318 kg)  SpO2 95%  Review of Systems  Constitutional: Negative for fever, chills, appetite change, fatigue and unexpected weight change.  HENT: Negative for congestion, ear pain, sinus pressure, sore throat, trouble swallowing and voice change.   Eyes: Negative for visual disturbance.  Respiratory: Negative for cough, shortness of breath, wheezing and stridor.   Cardiovascular: Negative for chest pain,  palpitations and leg swelling.  Gastrointestinal: Negative for nausea, vomiting, abdominal pain, diarrhea, constipation, blood in stool, abdominal distention and anal bleeding.  Genitourinary: Negative for dysuria and flank pain.  Musculoskeletal: Negative for arthralgias, gait problem, myalgias and neck pain.  Skin: Negative for color change and rash.  Neurological: Negative for dizziness and headaches.  Hematological: Negative for adenopathy. Does not bruise/bleed easily.  Psychiatric/Behavioral: Negative for suicidal ideas, sleep disturbance and dysphoric mood. The patient is not nervous/anxious.        Objective:   Physical Exam  Constitutional: She is oriented to person, place, and time. She appears well-developed and well-nourished. No distress.  HENT:  Head: Normocephalic and atraumatic.  Right Ear: External ear normal.  Left Ear: External ear normal.  Nose: Nose normal.  Mouth/Throat: Oropharynx is clear and moist. No oropharyngeal exudate.  Eyes: Conjunctivae are normal. Pupils are equal, round, and reactive to light. Right eye exhibits no discharge. Left eye exhibits no discharge. No scleral icterus.  Neck: Normal range of motion. Neck supple. No tracheal deviation present. No thyromegaly present.  Cardiovascular: Normal rate, regular rhythm, normal heart sounds and intact distal pulses.  Exam reveals no gallop and no friction rub.   No murmur heard. Pulmonary/Chest: Effort normal and breath sounds normal. No accessory muscle usage. Not tachypneic. No respiratory distress. She has no decreased breath sounds. She has no wheezes. She has no rhonchi. She has  no rales. She exhibits no tenderness.  Musculoskeletal: Normal range of motion. She exhibits no edema and no tenderness.  Lymphadenopathy:    She has no cervical adenopathy.  Neurological: She is alert and oriented to person, place, and time. No cranial nerve deficit. She exhibits normal muscle tone. Coordination normal.   Skin: Skin is warm and dry. No rash noted. She is not diaphoretic. No erythema. No pallor.  Psychiatric: She has a normal mood and affect. Her behavior is normal. Judgment and thought content normal.          Assessment & Plan:

## 2013-07-13 ENCOUNTER — Telehealth: Payer: Self-pay | Admitting: Internal Medicine

## 2013-07-13 NOTE — Telephone Encounter (Signed)
Patient informed to stop Fosamax and verbally agreed. Appointment scheduled for 12/30 at 545

## 2013-07-13 NOTE — Telephone Encounter (Signed)
Patient Information:  Caller Name: Erin [name]  Phone: [phone]  Patient: Erin [name]  Gender: Female  DOB: 1951/06/16  Age: 62 Years  PCP: Ronna Polio (Adults only)  Office Follow Up:  Does the office need to follow up with this patient?: No  Instructions For The Office: N/A  RN Note:  She would also like [name] code to get in to MyChart so that she can send emails for her questions in the future.  Symptoms  Reason For Call & Symptoms: After she takes the Fosamax she has joint pain in her hips and knees, and muscle pain in her buttocks and thighs.  She noted this with each dose she has taken  Reviewed Health History In EMR: Yes  Reviewed Medications In EMR: Yes  Reviewed Allergies In EMR: Yes  Reviewed Surgeries / Procedures: Yes  Date of Onset of Symptoms: 07/10/2013  Treatments Tried: Ibuprofen and Tylenol.  Might work for [name] while, but not very much.  Treatments Tried Worked: No  Guideline(s) Used:  No Protocol Available - Information Only  Disposition Per Guideline:   Discuss with PCP and Callback by Nurse Today  Reason For Disposition Reached:   Nursing judgment  Advice Given:  N/[name]  Patient Will Follow Care Advice:  YES

## 2013-07-13 NOTE — Telephone Encounter (Signed)
Please have her stop Fosamax, and we can set up appointment in the next few weeks to discuss alternative medication.

## 2013-07-13 NOTE — Telephone Encounter (Signed)
Please read below and advise.

## 2013-08-02 ENCOUNTER — Ambulatory Visit: Payer: 59 | Admitting: Internal Medicine

## 2013-08-08 ENCOUNTER — Ambulatory Visit: Payer: 59 | Admitting: Internal Medicine

## 2013-08-10 ENCOUNTER — Encounter: Payer: Self-pay | Admitting: *Deleted

## 2013-08-11 ENCOUNTER — Encounter (INDEPENDENT_AMBULATORY_CARE_PROVIDER_SITE_OTHER): Payer: Self-pay

## 2013-08-11 ENCOUNTER — Ambulatory Visit (INDEPENDENT_AMBULATORY_CARE_PROVIDER_SITE_OTHER): Payer: 59 | Admitting: Internal Medicine

## 2013-08-11 ENCOUNTER — Encounter: Payer: Self-pay | Admitting: Internal Medicine

## 2013-08-11 VITALS — BP 100/70 | HR 93 | Temp 98.7°F | Wt 144.0 lb

## 2013-08-11 DIAGNOSIS — M899 Disorder of bone, unspecified: Secondary | ICD-10-CM

## 2013-08-11 DIAGNOSIS — M949 Disorder of cartilage, unspecified: Secondary | ICD-10-CM

## 2013-08-11 DIAGNOSIS — M2550 Pain in unspecified joint: Secondary | ICD-10-CM | POA: Insufficient documentation

## 2013-08-11 DIAGNOSIS — M8580 Other specified disorders of bone density and structure, unspecified site: Secondary | ICD-10-CM

## 2013-08-11 NOTE — Progress Notes (Signed)
Pre-visit discussion using our clinic review tool. No additional management support is needed unless otherwise documented below in the visit note.  

## 2013-08-11 NOTE — Assessment & Plan Note (Signed)
Patient with diffuse arthralgia and myalgia ever since starting Fosamax. Will stop Fosamax. We discussed alternative medications including additional bisphosphonates, Prolia, and Evista. We'll plan to hold off an additional medication for now. Encouraged adequate dietary calcium, vitamin D supplementation 1000 units daily, and weight-bearing physical activity. Plan to repeat bone density testing in 2016.

## 2013-08-11 NOTE — Patient Instructions (Signed)
Stop Fosamax.  Stop Lipitor.   Continue Ibuprofen as needed for pain.  Follow up 4 weeks.

## 2013-08-11 NOTE — Assessment & Plan Note (Signed)
Diffuse arthralgia and myalgia over the last 3 months. Suspect related to use of Fosamax given that symptoms began after starting this medication. Will stop Fosamax. We'll also stop Lipitor for a brief period to see if this medication might be contributing. If no improvement with these interventions, we discussed potential referral to orthopedics. Continue prn ibuprofen.

## 2013-08-11 NOTE — Progress Notes (Signed)
Subjective:    Patient ID: Erin [name], female    DOB: Nov 25, 1950, 63 y.o.   MRN: [id number]  HPI 63 year old female with history of osteopenia presents for acute visit complaining of diffuse arthralgia and myalgia ever since starting Fosamax. She started Fosamax in September 2014. Since that time, she has had aching pain in her left hip, right knee, and generalized in other joints. She stopped taking the medication last week. She has not yet seen any improvement. Her bone density testing in September 2014 showed T score of -1.7. She had not previously been on bisphosphonates. Her mother has a history of osteoporosis and had been on Fosamax for years. She has tried to follow a healthy diet with adequate dietary calcium. She also takes a vitamin D supplement. She tries to get physical exercise but this has been limited because of diffuse aching pain recently.  Outpatient Encounter Prescriptions as of 08/11/2013  Medication Sig  . amitriptyline (ELAVIL) 50 MG tablet Take 1 tablet (50 mg total) by mouth at bedtime.  . cholecalciferol (VITAMIN D) 1000 UNITS tablet Take 1,000 Units by mouth daily.  . Cinnamon 500 MG capsule Take 500 mg by mouth daily.  . lansoprazole (PREVACID) 30 MG capsule Take 1 capsule (30 mg total) by mouth daily.  Marland Kitchen levothyroxine (SYNTHROID, LEVOTHROID) 88 MCG tablet Take 88 mcg by mouth daily. Brand name  . Multiple Vitamins-Minerals (CENTRUM SILVER PO) Take by mouth.  . [DISCONTINUED] atorvastatin (LIPITOR) 10 MG tablet Take 1 tablet (10 mg total) by mouth daily.  . [DISCONTINUED] alendronate (FOSAMAX) 70 MG tablet Take 1 tablet (70 mg total) by mouth every 7 (seven) days. Take with a full glass of water on an empty stomach.  . [DISCONTINUED] fish oil-omega-3 fatty acids 1000 MG capsule Take 2 g by mouth daily.   BP 100/70  Pulse 93  Temp(Src) 98.7 F (37.1 C) (Oral)  Wt 144 lb (65.318 kg)  SpO2 97%    Review of Systems  Constitutional: Negative for fever, chills,  appetite change, fatigue and unexpected weight change.  HENT: Negative for congestion, ear pain, sinus pressure, sore throat, trouble swallowing and voice change.   Eyes: Negative for visual disturbance.  Respiratory: Negative for cough, shortness of breath, wheezing and stridor.   Cardiovascular: Negative for chest pain, palpitations and leg swelling.  Gastrointestinal: Negative for nausea, vomiting, abdominal pain, diarrhea, constipation, blood in stool, abdominal distention and anal bleeding.  Genitourinary: Negative for dysuria and flank pain.  Musculoskeletal: Positive for arthralgias and myalgias. Negative for gait problem and neck pain.  Skin: Negative for color change and rash.  Neurological: Negative for dizziness and headaches.  Hematological: Negative for adenopathy. Does not bruise/bleed easily.  Psychiatric/Behavioral: Negative for suicidal ideas, sleep disturbance and dysphoric mood. The patient is not nervous/anxious.        Objective:   Physical Exam  Constitutional: She is oriented to person, place, and time. She appears well-developed and well-nourished. No distress.  HENT:  Head: Normocephalic and atraumatic.  Right Ear: External ear normal.  Left Ear: External ear normal.  Nose: Nose normal.  Mouth/Throat: Oropharynx is clear and moist. No oropharyngeal exudate.  Eyes: Conjunctivae are normal. Pupils are equal, round, and reactive to light. Right eye exhibits no discharge. Left eye exhibits no discharge. No scleral icterus.  Neck: Normal range of motion. Neck supple. No tracheal deviation present. No thyromegaly present.  Cardiovascular: Normal rate, regular rhythm, normal heart sounds and intact distal pulses.  Exam reveals no gallop  and no friction rub.   No murmur heard. Pulmonary/Chest: Effort normal and breath sounds normal. No accessory muscle usage. Not tachypneic. No respiratory distress. She has no decreased breath sounds. She has no wheezes. She has no  rhonchi. She has no rales. She exhibits no tenderness.  Musculoskeletal: Normal range of motion. She exhibits no edema and no tenderness.  Lymphadenopathy:    She has no cervical adenopathy.  Neurological: She is alert and oriented to person, place, and time. No cranial nerve deficit. She exhibits normal muscle tone. Coordination normal.  Skin: Skin is warm and dry. No rash noted. She is not diaphoretic. No erythema. No pallor.  Psychiatric: She has a normal mood and affect. Her behavior is normal. Judgment and thought content normal.          Assessment & Plan:

## 2013-10-05 ENCOUNTER — Encounter: Payer: Self-pay | Admitting: *Deleted

## 2013-10-21 ENCOUNTER — Encounter: Payer: Self-pay | Admitting: *Deleted

## 2013-10-24 ENCOUNTER — Ambulatory Visit (INDEPENDENT_AMBULATORY_CARE_PROVIDER_SITE_OTHER): Payer: 59 | Admitting: Internal Medicine

## 2013-10-24 ENCOUNTER — Encounter (INDEPENDENT_AMBULATORY_CARE_PROVIDER_SITE_OTHER): Payer: Self-pay

## 2013-10-24 ENCOUNTER — Encounter: Payer: Self-pay | Admitting: Internal Medicine

## 2013-10-24 VITALS — BP 120/80 | HR 93 | Temp 98.1°F | Ht 62.5 in | Wt 146.0 lb

## 2013-10-24 DIAGNOSIS — Z0000 Encounter for general adult medical examination without abnormal findings: Secondary | ICD-10-CM

## 2013-10-24 DIAGNOSIS — M25551 Pain in right hip: Secondary | ICD-10-CM | POA: Insufficient documentation

## 2013-10-24 DIAGNOSIS — M25559 Pain in unspecified hip: Secondary | ICD-10-CM

## 2013-10-24 MED ORDER — MELOXICAM 15 MG PO TABS: 15.0000 mg | ORAL_TABLET | Freq: Every day | ORAL | Status: DC

## 2013-10-24 NOTE — Progress Notes (Signed)
Pre-visit discussion using our clinic review tool. No additional management support is needed unless otherwise documented below in the visit note.  

## 2013-10-24 NOTE — Progress Notes (Signed)
   Subjective:    Patient ID: Erin [name], female    DOB: Sep 30, 1950, 63 y.o.   MRN: [id number]  HPI 63YO female presents for annual exam.  Continues to have some bilateral hip pain right>left. Described as mild aching. Taking occasional Ibuprofen with improvement.  Otherwise, feeling well. Exercising by lifting weights and occasional Zumba. Follows healthy diet.  Review of Systems  Constitutional: Negative for fever, chills, appetite change, fatigue and unexpected weight change.  HENT: Negative for congestion, ear pain, sinus pressure, sore throat, trouble swallowing and voice change.   Eyes: Negative for visual disturbance.  Respiratory: Negative for cough, shortness of breath, wheezing and stridor.   Cardiovascular: Negative for chest pain, palpitations and leg swelling.  Gastrointestinal: Negative for nausea, vomiting, abdominal pain, diarrhea, constipation, blood in stool, abdominal distention and anal bleeding.  Genitourinary: Negative for dysuria and flank pain.  Musculoskeletal: Negative for arthralgias, gait problem, myalgias and neck pain.  Skin: Negative for color change and rash.  Neurological: Negative for dizziness and headaches.  Hematological: Negative for adenopathy. Does not bruise/bleed easily.  Psychiatric/Behavioral: Negative for suicidal ideas, sleep disturbance and dysphoric mood. The patient is not nervous/anxious.        Objective:    BP 120/80  Pulse 93  Temp(Src) 98.1 F (36.7 C) (Oral)  Ht 5' 2.5" (1.588 m)  Wt 146 lb (66.225 kg)  BMI 26.26 kg/m2  SpO2 97% Physical Exam  Constitutional: She is oriented to person, place, and time. She appears well-developed and well-nourished. No distress.  HENT:  Head: Normocephalic and atraumatic.  Right Ear: External ear normal.  Left Ear: External ear normal.  Nose: Nose normal.  Mouth/Throat: Oropharynx is clear and moist. No oropharyngeal exudate.  Eyes: Conjunctivae are normal. Pupils are equal, round,  and reactive to light. Right eye exhibits no discharge. Left eye exhibits no discharge. No scleral icterus.  Neck: Normal range of motion. Neck supple. No tracheal deviation present. No thyromegaly present.  Cardiovascular: Normal rate, regular rhythm, normal heart sounds and intact distal pulses.  Exam reveals no gallop and no friction rub.   No murmur heard. Pulmonary/Chest: Effort normal and breath sounds normal. No accessory muscle usage. Not tachypneic. No respiratory distress. She has no decreased breath sounds. She has no wheezes. She has no rhonchi. She has no rales. She exhibits no tenderness. Right breast exhibits no inverted nipple, no mass, no nipple discharge, no skin change and no tenderness. Left breast exhibits no inverted nipple, no mass, no nipple discharge, no skin change and no tenderness. Breasts are symmetrical.  Abdominal: Soft. Bowel sounds are normal. She exhibits no distension and no mass. There is no tenderness. There is no rebound and no guarding.  Musculoskeletal: Normal range of motion. She exhibits no edema and no tenderness.  Lymphadenopathy:    She has no cervical adenopathy.  Neurological: She is alert and oriented to person, place, and time. No cranial nerve deficit. She exhibits normal muscle tone. Coordination normal.  Skin: Skin is warm and dry. No rash noted. She is not diaphoretic. No erythema. No pallor.  Psychiatric: She has a normal mood and affect. Her behavior is normal. Judgment and thought content normal.          Assessment & Plan:   Problem List Items Addressed This Visit   None       No Follow-up on file.

## 2013-10-24 NOTE — Assessment & Plan Note (Signed)
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 Generally unchanged over last 6 months. Symptoms improved with use of ibuprofen prn. Will try prn Meloxicam for longer lasting relief of symptoms. Discussed potential risks of this drug and encouraged her to take it with food. Plan follow up prn. Discussed possible referral to sports medicine if symptoms persist or worsen.

## 2013-10-24 NOTE — Assessment & Plan Note (Addendum)
General medical exam normal today including breast exam. PAP and pelvic recently performed by GYN 05/2013 was normal. Mammogram UTD 04/2013. Colonoscopy UTD 2007, repeat 2017. Immunizations UTD. Will check labs including CBC, CMP, lipids, Vit D, TSH fasting next week. Encouraged healthy diet and regular exercise.

## 2013-11-01 ENCOUNTER — Other Ambulatory Visit (INDEPENDENT_AMBULATORY_CARE_PROVIDER_SITE_OTHER): Payer: 59

## 2013-11-01 DIAGNOSIS — Z0000 Encounter for general adult medical examination without abnormal findings: Secondary | ICD-10-CM

## 2013-11-01 LAB — LIPID PANEL
Cholesterol: 207 mg/dL — ABNORMAL HIGH (ref 0–200)
HDL: 35.6 mg/dL — ABNORMAL LOW (ref >39.00)
LDL Cholesterol: 129 mg/dL — ABNORMAL HIGH (ref 0–99)
Total CHOL/HDL Ratio: 6
Triglycerides: 210 mg/dL — ABNORMAL HIGH (ref 0.0–149.0)
VLDL: 42 mg/dL — AB (ref 0.0–40.0)

## 2013-11-01 LAB — CBC WITH DIFFERENTIAL/PLATELET
BASOS ABS: 0 10*3/uL (ref 0.0–0.1)
Basophils Relative: 0.4 % (ref 0.0–3.0)
Eosinophils Absolute: 0.2 10*3/uL (ref 0.0–0.7)
Eosinophils Relative: 2.9 % (ref 0.0–5.0)
HEMATOCRIT: 40.7 % (ref 36.0–46.0)
Hemoglobin: 13.7 g/dL (ref 12.0–15.0)
LYMPHS ABS: 2.1 10*3/uL (ref 0.7–4.0)
Lymphocytes Relative: 30.2 % (ref 12.0–46.0)
MCHC: 33.6 g/dL (ref 30.0–36.0)
MCV: 91.7 fl (ref 78.0–100.0)
Monocytes Absolute: 0.4 10*3/uL (ref 0.1–1.0)
Monocytes Relative: 6.3 % (ref 3.0–12.0)
Neutro Abs: 4.2 10*3/uL (ref 1.4–7.7)
Neutrophils Relative %: 60.2 % (ref 43.0–77.0)
Platelets: 259 10*3/uL (ref 150.0–400.0)
RBC: 4.44 Mil/uL (ref 3.87–5.11)
RDW: 13.8 % (ref 11.5–14.6)
WBC: 6.9 10*3/uL (ref 4.5–10.5)

## 2013-11-01 LAB — COMPREHENSIVE METABOLIC PANEL
ALBUMIN: 4.2 g/dL (ref 3.5–5.2)
ALT: 22 U/L (ref 0–35)
AST: 24 U/L (ref 0–37)
Alkaline Phosphatase: 56 U/L (ref 39–117)
BILIRUBIN TOTAL: 0.6 mg/dL (ref 0.3–1.2)
BUN: 15 mg/dL (ref 6–23)
CALCIUM: 9.2 mg/dL (ref 8.4–10.5)
CHLORIDE: 106 meq/L (ref 96–112)
CO2: 28 mEq/L (ref 19–32)
Creatinine, Ser: 0.9 mg/dL (ref 0.4–1.2)
GFR: 71.16 mL/min (ref >60.00)
GLUCOSE: 107 mg/dL — AB (ref 70–99)
POTASSIUM: 4 meq/L (ref 3.5–5.1)
SODIUM: 142 meq/L (ref 135–145)
TOTAL PROTEIN: 6.8 g/dL (ref 6.0–8.3)

## 2013-11-01 LAB — TSH: TSH: 0.96 u[IU]/mL (ref 0.35–5.50)

## 2013-11-01 LAB — VITAMIN D 25 HYDROXY (VIT D DEFICIENCY, FRACTURES): VIT D 25 HYDROXY: 63 ng/mL (ref 30–89)

## 2013-11-02 ENCOUNTER — Encounter: Payer: Self-pay | Admitting: *Deleted

## 2013-11-03 ENCOUNTER — Telehealth: Payer: Self-pay | Admitting: *Deleted

## 2013-11-03 NOTE — Telephone Encounter (Signed)
Patient called to get lab results, results verbally given to patient and lab appointment scheduled.

## 2014-05-02 ENCOUNTER — Other Ambulatory Visit: Payer: Self-pay | Admitting: *Deleted

## 2014-05-02 ENCOUNTER — Telehealth: Payer: Self-pay | Admitting: *Deleted

## 2014-05-02 DIAGNOSIS — E785 Hyperlipidemia, unspecified: Secondary | ICD-10-CM

## 2014-05-02 NOTE — Telephone Encounter (Signed)
Pt is coming tomorrow what labs and dx?  

## 2014-05-02 NOTE — Telephone Encounter (Signed)
CMP and lipids 272.4

## 2014-05-03 ENCOUNTER — Other Ambulatory Visit (INDEPENDENT_AMBULATORY_CARE_PROVIDER_SITE_OTHER): Payer: 59

## 2014-05-03 DIAGNOSIS — E785 Hyperlipidemia, unspecified: Secondary | ICD-10-CM

## 2014-05-03 LAB — COMPREHENSIVE METABOLIC PANEL
ALT: 25 U/L (ref 0–35)
AST: 22 U/L (ref 0–37)
Albumin: 4.2 g/dL (ref 3.5–5.2)
Alkaline Phosphatase: 59 U/L (ref 39–117)
BUN: 16 mg/dL (ref 6–23)
CALCIUM: 9.3 mg/dL (ref 8.4–10.5)
CHLORIDE: 106 meq/L (ref 96–112)
CO2: 30 mEq/L (ref 19–32)
CREATININE: 0.7 mg/dL (ref 0.4–1.2)
GFR: 85.84 mL/min (ref >60.00)
Glucose, Bld: 106 mg/dL — ABNORMAL HIGH (ref 70–99)
Potassium: 4.3 mEq/L (ref 3.5–5.1)
SODIUM: 142 meq/L (ref 135–145)
TOTAL PROTEIN: 7.4 g/dL (ref 6.0–8.3)
Total Bilirubin: 0.5 mg/dL (ref 0.2–1.2)

## 2014-05-03 LAB — LIPID PANEL
CHOL/HDL RATIO: 6
Cholesterol: 224 mg/dL — ABNORMAL HIGH (ref 0–200)
HDL: 35.9 mg/dL — AB (ref >39.00)
LDL Cholesterol: 157 mg/dL — ABNORMAL HIGH (ref 0–99)
NonHDL: 188.1
Triglycerides: 157 mg/dL — ABNORMAL HIGH (ref 0.0–149.0)
VLDL: 31.4 mg/dL (ref 0.0–40.0)

## 2014-05-04 ENCOUNTER — Encounter: Payer: Self-pay | Admitting: *Deleted

## 2014-05-09 ENCOUNTER — Ambulatory Visit: Payer: Self-pay | Admitting: Internal Medicine

## 2014-05-09 LAB — HM MAMMOGRAPHY: HM Mammogram: NEGATIVE

## 2014-05-10 ENCOUNTER — Encounter: Payer: Self-pay | Admitting: *Deleted

## 2014-06-16 ENCOUNTER — Telehealth: Payer: Self-pay | Admitting: Internal Medicine

## 2014-06-16 NOTE — Telephone Encounter (Signed)
Spoke to pt, denies any urinary symptoms. She believes it is vaginal bleeding. Pt does have a GYN, recommended to call their office to schedule an appointment.  verbalized understanding. Pt will watch over the weekend and will call back to our office if she thinks she needs to be seen and not vaginal bleeding.

## 2014-06-16 NOTE — Telephone Encounter (Signed)
[facility] called to schedule pt for an appt. Complaint of seeing blood after urination/bowel movement. Please advise where to add pt to schedule.msn

## 2014-06-16 NOTE — Telephone Encounter (Signed)
If any urinary symptoms or ANY recurrent bleeding, then needs to be seen immediately this weekend at [facility]. If not, can follow up in 58min slot next week and we can examine her.

## 2014-06-16 NOTE — Telephone Encounter (Signed)
Patient Information:  Caller Name: Kia Varnadore  Phone: [phone]  Patient: Erin [name], Erin [name]  Gender: Female  DOB: 09/02/1950  Age: 63 Years  PCP: Ronette Deter (Adults only)  Office Follow Up:  Does the office need to follow up with this patient?: Yes  Instructions For The Office: RN spoke with Melissa, in office, for appt. scheduling. Melissa states she will get a message to Dr. Gilford Rile regarding appt. scheduling and will return call to patient regarding appt. RN provided Melissa with callback numbers for patient: [phone]; [phone]. Patient informed of above. Call back parameters reviewed. Patient verbalizes understanding.  RN Note:  Patient states she used the restroom and urinated and had a bowel movement at approx. 1430 06/16/14. Patient states she noted bright red blood, on the toilet tissue after wiping. States area of blood was approx. the size of a fifty cent piece. Patient states she was unable to verify where the bleeding originated. Patient states no additional bleeding noted. Patient states bowel movement was brown, formed. Denies melena. Patient denies urinary sx. Denies pain or burning with urination. Denies flank or abdominal pain. Afebrile. Care advice given per guidelines. Patient advised to monitor for bleeding and it's origin. Call back parameters reviewed. Patient verbalizes understanding. RN spoke with Lenna Sciara, in office, for appt. scheduling. Melissa states she will get a message to Dr. Gilford Rile regarding appt. scheduling and will return call to patient regarding appt. RN provided Melissa with callback numbers for patient: [phone]; Mobile-[phone]. Patient informed of above. Call back parameters reviewed. Patient verbalizes understanding.  Symptoms  Reason For Call & Symptoms: Blood noted on tolilet tissue after wiping  Reviewed Health History In EMR: Yes  Reviewed Medications In EMR: Yes  Reviewed Allergies In EMR: Yes  Reviewed Surgeries  / Procedures: Yes  Date of Onset of Symptoms: 06/16/2014  Guideline(s) Used:  Vaginal Bleeding - Abnormal  Rectal Bleeding  Disposition Per Guideline:   See Within 2 Weeks in Office  Reason For Disposition Reached:   Rectal bleeding is minimal (e.g., blood just on toilet paper, a few drops in toilet bowl)  Advice Given:  Call Back If:  Bleeding becomes worse  You become worse.  High Fiber Diet:  Try to eat fresh fruit and vegetables at each meal (peas, prunes, citrus, apples, beans, corn).  Eat more grain foods (bran flakes, bran muffins, graham crackers, oatmeal, brown rice, and whole wheat bread).  Call Back If:  Bleeding increases in amount  Bleeding occurs 3 or more times after treatment begins  You become worse.  Patient Will Follow Care Advice:  YES

## 2014-07-06 ENCOUNTER — Other Ambulatory Visit: Payer: Self-pay | Admitting: Internal Medicine

## 2014-07-17 LAB — HM PAP SMEAR: HM Pap smear: NORMAL

## 2014-10-06 ENCOUNTER — Other Ambulatory Visit: Payer: Self-pay | Admitting: Internal Medicine

## 2014-11-06 ENCOUNTER — Encounter: Payer: Self-pay | Admitting: Family Medicine

## 2014-11-06 ENCOUNTER — Ambulatory Visit (INDEPENDENT_AMBULATORY_CARE_PROVIDER_SITE_OTHER): Payer: 59 | Admitting: Family Medicine

## 2014-11-06 ENCOUNTER — Other Ambulatory Visit (INDEPENDENT_AMBULATORY_CARE_PROVIDER_SITE_OTHER): Payer: 59

## 2014-11-06 VITALS — BP 112/84 | HR 93 | Ht 63.0 in | Wt 134.0 lb

## 2014-11-06 DIAGNOSIS — M7550 Bursitis of unspecified shoulder: Secondary | ICD-10-CM | POA: Insufficient documentation

## 2014-11-06 DIAGNOSIS — M25512 Pain in left shoulder: Secondary | ICD-10-CM

## 2014-11-06 DIAGNOSIS — M7552 Bursitis of left shoulder: Secondary | ICD-10-CM

## 2014-11-06 NOTE — Assessment & Plan Note (Signed)
Patient was given an injection today. Patient tolerated the procedure well with good resolution of pain. Discussed patient home exercises, ice, topical anti-inflammatories and over-the-counter natural supplementations. Patient has had a history of shoulder surgery on the right side for a bone spur. We discussed the possibility of x-rays but patient declined. Patient also was given the choice for formal physical therapy which patient declined. Patient did work with Product/process development scientist today to learn exercises in greater detail. Patient will try to make these changes and come back in 3 weeks for further evaluation and treatment.

## 2014-11-06 NOTE — Patient Instructions (Signed)
Good to meet you Ice 20 minutes 2 times daily. Usually after activity and before bed. Exercises 3 times a week.  [place] twice daily Vitamin D 2000 IU daily Turmeric 500mg  twice daily See me again in 3 weeks.

## 2014-11-06 NOTE — Progress Notes (Signed)
[name] [address], [place] [address] Phone: [phone] Subjective:    I'm seeing this patient by the request  of:  Rica Mast, MD   CC: Left shoulder  [id number] Erin [name] is a 65 y.o. female coming in with complaint of left shoulder. Patient states that she did have an injury back in 2013. Patient states that she seemed to totally resolved from that but unfortunately over the course last 2 months patient is started having recurrent pain. Patient states this seems to be more on the anterior aspect of the shoulder and hurts more with certain activities such as reaching across her body or behind her back. Patient states that she has full range of motion but states that she has to be careful. Patient denies any weakness, any radiation of pain down the arm, or any numbness. Patient states though that the pain is severe enough that he can wake her up at night. Patient is taking anti-inflammatories fairly regularly to help. Patient is still able to work but has pain, dull throbbing, nearly all the time.     Past medical history, social, surgical and family history all reviewed in electronic medical record.   Past Medical History  Diagnosis Date  . Thyroid disease   . Cancer 2011    thyroid, Dr. Carlis Abbott   Past Surgical History  Procedure Laterality Date  . Gallbladder surgery  2000  . Knee surgery  2003/ 2010    right knee x2 Dr. Marry Guan for torn meniscus  . Shoulder surgery  1990    right, arthritis  . Thyroidectomy  2011    right side   scoliosis Family History  Problem Relation Age of Onset  . Stroke Mother   . Heart disease Father   . Cancer Maternal Grandmother     pancreatic   Allergies  Allergen Reactions  . Amoxicillin Hives  . Codeine Nausea Only    weakness     Review of Systems: No headache, visual changes, nausea, vomiting, diarrhea, constipation, dizziness, abdominal pain, skin rash, fevers,  chills, night sweats, weight loss, swollen lymph nodes, body aches, joint swelling, muscle aches, chest pain, shortness of breath, mood changes.   Objective Blood pressure 112/84, pulse 93, height 5\' 3"  (1.6 m), weight 134 lb (60.782 kg), SpO2 96 %.  General: No apparent distress alert and oriented x3 mood and affect normal, dressed appropriately.  HEENT: Pupils equal, extraocular movements intact  Respiratory: Patient's speak in full sentences and does not appear short of breath  Cardiovascular: No lower extremity edema, non tender, no erythema  Skin: Warm dry intact with no signs of infection or rash on extremities or on axial skeleton.  Abdomen: Soft nontender  Neuro: Cranial nerves II through XII are intact, neurovascularly intact in all extremities with 2+ DTRs and 2+ pulses.  Lymph: No lymphadenopathy of posterior or anterior cervical chain or axillae bilaterally.  Gait normal with good balance and coordination.  MSK:  Non tender with full range of motion and good stability and symmetric strength and tone of  elbows, wrist, hip, knee and ankles bilaterally.  Shoulder: left Inspection reveals no abnormalities, atrophy or asymmetry. Palpation is normal with no tenderness over AC joint or bicipital groove. ROM is full in all planes passively. Rotator cuff strength normal throughout. signs of impingement with positive Neer and Hawkin's tests, but negative empty can sign. Speeds and Yergason's tests normal. No labral pathology noted with negative Obrien's, negative clunk and  good stability. Normal scapular function observed. No painful arc and no drop arm sign. No apprehension sign  MSK US performed of: left This study was ordered, performed, and interpreted by Charlann Boxer D.O.  Shoulder:   Supraspinatus:  Appears normal on long and transverse views, Bursal bulge seen with shoulder abduction on impingement view.mild atrophy of the rotator cuff Infraspinatus:  Appears normal on long and  transverse views. Significant increase in Doppler flow atrophy of the tendon Subscapularis:  Appears normal on long and transverse views. Positive bursamild atrophy but no true tear Teres Minor:  Appears normal on long and transverse views. AC joint:  Capsule undistended, no geyser sign. Glenohumeral Joint:  Appears normal without effusion. Glenoid Labrum:  Intact without visualized tears. Biceps Tendon:  Appears normal on long and transverse views, no fraying of tendon, tendon located in intertubercular groove, no subluxation with shoulder internal or external rotation.  Impression: Subacromial bursitismild atrophy of the rotator cuff but no tear appreciated  Procedure: Real-time Ultrasound Guided Injection of left glenohumeral joint Device: GE Logiq E  Ultrasound guided injection is preferred based studies that show increased duration, increased effect, greater accuracy, decreased procedural pain, increased response rate with ultrasound guided versus blind injection.  Verbal informed consent obtained.  Time-out conducted.  Noted no overlying erythema, induration, or other signs of local infection.  Skin prepped in a sterile fashion.  Local anesthesia: Topical Ethyl chloride.  With sterile technique and under real time ultrasound guidance:  Joint visualized.  23g 1  inch needle inserted posterior approach. Pictures taken for needle placement. Patient did have injection of 2 cc of 1% lidocaine, 2 cc of 0.5% Marcaine, and 1.0 cc of Kenalog 40 mg/dL. Completed without difficulty  Pain immediately resolved suggesting accurate placement of the medication.  Advised to call if fevers/chills, erythema, induration, drainage, or persistent bleeding.  Images permanently stored and available for review in the ultrasound unit.  Impression: Technically successful ultrasound guided injection.  Procedure note 41937; 15 minutes spent for Therapeutic exercises as stated in above notes.  This included  exercises focusing on stretching, strengthening, with significant focus on eccentric aspects.  Shoulder Exercises that included:  Basic scapular stabilization to include adduction and depression of scapula Scaption, focusing on proper movement and good control Internal and External rotation utilizing a theraband, with elbow tucked at side entire time Rows with theraband Proper technique shown and discussed handout in great detail with ATC.  All questions were discussed and answered.     Impression and Recommendations:     This case required medical decision making of moderate complexity.

## 2014-11-14 ENCOUNTER — Telehealth: Payer: Self-pay | Admitting: *Deleted

## 2014-11-14 NOTE — Telephone Encounter (Signed)
CBC, CMP, lipids, TSH, A1c, Vit D

## 2014-11-14 NOTE — Telephone Encounter (Signed)
Pt coming in tomorrow what labs and dx?  

## 2014-11-14 NOTE — Telephone Encounter (Signed)
Is this for a physical? 

## 2014-11-14 NOTE — Telephone Encounter (Signed)
Yes

## 2014-11-15 ENCOUNTER — Other Ambulatory Visit (INDEPENDENT_AMBULATORY_CARE_PROVIDER_SITE_OTHER): Payer: 59

## 2014-11-15 ENCOUNTER — Encounter: Payer: Self-pay | Admitting: Internal Medicine

## 2014-11-15 ENCOUNTER — Ambulatory Visit (INDEPENDENT_AMBULATORY_CARE_PROVIDER_SITE_OTHER): Payer: 59 | Admitting: Internal Medicine

## 2014-11-15 VITALS — BP 109/71 | HR 71 | Temp 98.0°F | Ht 62.0 in | Wt 134.0 lb

## 2014-11-15 DIAGNOSIS — Z0000 Encounter for general adult medical examination without abnormal findings: Secondary | ICD-10-CM

## 2014-11-15 LAB — COMPREHENSIVE METABOLIC PANEL
ALBUMIN: 4.2 g/dL (ref 3.5–5.2)
ALT: 28 U/L (ref 0–35)
AST: 19 U/L (ref 0–37)
Alkaline Phosphatase: 65 U/L (ref 39–117)
BILIRUBIN TOTAL: 0.5 mg/dL (ref 0.2–1.2)
BUN: 19 mg/dL (ref 6–23)
CALCIUM: 9.7 mg/dL (ref 8.4–10.5)
CO2: 30 meq/L (ref 19–32)
Chloride: 105 mEq/L (ref 96–112)
Creatinine, Ser: 0.85 mg/dL (ref 0.40–1.20)
GFR: 71.89 mL/min (ref >60.00)
GLUCOSE: 93 mg/dL (ref 70–99)
Potassium: 4.1 mEq/L (ref 3.5–5.1)
Sodium: 142 mEq/L (ref 135–145)
Total Protein: 7 g/dL (ref 6.0–8.3)

## 2014-11-15 LAB — VITAMIN D 25 HYDROXY (VIT D DEFICIENCY, FRACTURES): VITD: 60.38 ng/mL (ref 30.00–100.00)

## 2014-11-15 LAB — LIPID PANEL
CHOLESTEROL: 198 mg/dL (ref 0–200)
HDL: 40.6 mg/dL (ref >39.00)
LDL CALC: 137 mg/dL — AB (ref 0–99)
NonHDL: 157.4
TRIGLYCERIDES: 104 mg/dL (ref 0.0–149.0)
Total CHOL/HDL Ratio: 5
VLDL: 20.8 mg/dL (ref 0.0–40.0)

## 2014-11-15 LAB — CBC WITH DIFFERENTIAL/PLATELET
Basophils Absolute: 0 10*3/uL (ref 0.0–0.1)
Basophils Relative: 0.4 % (ref 0.0–3.0)
EOS ABS: 0.2 10*3/uL (ref 0.0–0.7)
EOS PCT: 1.6 % (ref 0.0–5.0)
HEMATOCRIT: 41.7 % (ref 36.0–46.0)
Hemoglobin: 14.2 g/dL (ref 12.0–15.0)
Lymphocytes Relative: 24.2 % (ref 12.0–46.0)
Lymphs Abs: 2.9 10*3/uL (ref 0.7–4.0)
MCHC: 34.2 g/dL (ref 30.0–36.0)
MCV: 89.5 fl (ref 78.0–100.0)
MONOS PCT: 6.8 % (ref 3.0–12.0)
Monocytes Absolute: 0.8 10*3/uL (ref 0.1–1.0)
NEUTROS PCT: 67 % (ref 43.0–77.0)
Neutro Abs: 8 10*3/uL — ABNORMAL HIGH (ref 1.4–7.7)
Platelets: 294 10*3/uL (ref 150.0–400.0)
RBC: 4.66 Mil/uL (ref 3.87–5.11)
RDW: 13.8 % (ref 11.5–15.5)
WBC: 12 10*3/uL — AB (ref 4.0–10.5)

## 2014-11-15 LAB — TSH: TSH: 0.33 u[IU]/mL — ABNORMAL LOW (ref 0.35–4.50)

## 2014-11-15 MED ORDER — LANSOPRAZOLE 30 MG PO CPDR: DELAYED_RELEASE_CAPSULE | ORAL | Status: DC

## 2014-11-15 MED ORDER — AMITRIPTYLINE HCL 50 MG PO TABS: 50.0000 mg | ORAL_TABLET | Freq: Every day | ORAL | Status: DC

## 2014-11-15 NOTE — Assessment & Plan Note (Signed)
General medical exam normal today. Breast and pelvic exam deferred as pt recently had exam with Dr. Georga Bora. Reviewed PAP which was normal. Mammogram UTD. Colonoscopy UTD and due in 2017. Will check labs today including CBC, CMP, lipids, TSH, Vit D. Encouraged continued healthy diet and exercise.

## 2014-11-15 NOTE — Progress Notes (Signed)
Pre visit review using our clinic review tool, if applicable. No additional management support is needed unless otherwise documented below in the visit note. 

## 2014-11-15 NOTE — Patient Instructions (Signed)

## 2014-11-15 NOTE — Progress Notes (Signed)
   Subjective:    Patient ID: Erin [name], female    DOB: 10-07-1950, 64 y.o.   MRN: [id number]  HPI  64YO female presents for annual exam.  Feeling well. No concerns today. Following healthy diet. Exercising with lifting weights and Zumba.  Wt Readings from Last 3 Encounters:  11/15/14 134 lb (60.782 kg)  11/06/14 134 lb (60.782 kg)  10/24/13 146 lb (66.225 kg)     Past medical, surgical, family and social history per today's encounter.  Review of Systems  Constitutional: Negative for fever, chills, appetite change, fatigue and unexpected weight change.  Eyes: Negative for visual disturbance.  Respiratory: Negative for shortness of breath.   Cardiovascular: Negative for chest pain and leg swelling.  Gastrointestinal: Negative for nausea, vomiting, abdominal pain, diarrhea and constipation.  Musculoskeletal: Positive for arthralgias. Negative for myalgias.  Skin: Negative for color change and rash.  Hematological: Negative for adenopathy. Does not bruise/bleed easily.  Psychiatric/Behavioral: Negative for suicidal ideas, sleep disturbance and dysphoric mood. The patient is not nervous/anxious.        Objective:    BP 109/71 mmHg  Pulse 71  Temp(Src) 98 F (36.7 C) (Oral)  Ht 5\' 2"  (1.575 m)  Wt 134 lb (60.782 kg)  BMI 24.50 kg/m2  SpO2 100% Physical Exam  Constitutional: She is oriented to person, place, and time. She appears well-developed and well-nourished. No distress.  HENT:  Head: Normocephalic and atraumatic.  Right Ear: External ear normal.  Left Ear: External ear normal.  Nose: Nose normal.  Mouth/Throat: Oropharynx is clear and moist. No oropharyngeal exudate.  Eyes: Conjunctivae and EOM are normal. Pupils are equal, round, and reactive to light. Right eye exhibits no discharge.  Neck: Normal range of motion. Neck supple. No thyromegaly present.  Cardiovascular: Normal rate, regular rhythm, normal heart sounds and intact distal pulses.  Exam reveals no  gallop and no friction rub.   No murmur heard. Pulmonary/Chest: Effort normal. No respiratory distress. She has no wheezes. She has no rales.  Abdominal: Soft. Bowel sounds are normal. She exhibits no distension and no mass. There is no tenderness. There is no rebound and no guarding.  Musculoskeletal: Normal range of motion. She exhibits no edema or tenderness.  Lymphadenopathy:    She has no cervical adenopathy.  Neurological: She is alert and oriented to person, place, and time. No cranial nerve deficit. Coordination normal.  Skin: Skin is warm and dry. No rash noted. She is not diaphoretic. No erythema. No pallor.  Psychiatric: She has a normal mood and affect. Her behavior is normal. Judgment and thought content normal.          Assessment & Plan:   Problem List Items Addressed This Visit      Unprioritized   Routine general medical examination at a health care facility - Primary    General medical exam normal today. Breast and pelvic exam deferred as pt recently had exam with Dr. Georga Bora. Reviewed PAP which was normal. Mammogram UTD. Colonoscopy UTD and due in 2017. Will check labs today including CBC, CMP, lipids, TSH, Vit D. Encouraged continued healthy diet and exercise.      Relevant Orders   TSH   CBC with Differential/Platelet   Comprehensive metabolic panel   Lipid panel   Vit D  25 hydroxy (rtn osteoporosis monitoring)       Return in about 1 year (around 11/15/2015) for Physical.

## 2014-11-30 ENCOUNTER — Ambulatory Visit (INDEPENDENT_AMBULATORY_CARE_PROVIDER_SITE_OTHER): Payer: 59 | Admitting: Family Medicine

## 2014-11-30 ENCOUNTER — Encounter: Payer: Self-pay | Admitting: Family Medicine

## 2014-11-30 VITALS — BP 102/64 | HR 85 | Ht 62.0 in | Wt 135.0 lb

## 2014-11-30 DIAGNOSIS — M7552 Bursitis of left shoulder: Secondary | ICD-10-CM

## 2014-11-30 NOTE — Progress Notes (Signed)
Pre visit review using our clinic review tool, if applicable. No additional management support is needed unless otherwise documented below in the visit note. 

## 2014-11-30 NOTE — Progress Notes (Signed)
  [name] [address], [place] [address] Phone: [phone] Subjective:    I'm seeing this patient by the request  of:  Rica Mast, MD   CC: Left shoulder  [id number] Erin [name] is a 64 y.o. female coming in with complaint of left shoulder. Patient states that she did have an injury back in 2013.  Patient was seen previously and had more of a subacromial bursitis. Patient was given an injection and states that she is feeling 95% better. Patient has been doing the exercises intermittently as well as icing. Patient is also taking the over-the-counter natural supple mentation's. We suggested. Patient is very happy with the results.     Past medical history, social, surgical and family history all reviewed in electronic medical record.   Past Medical History  Diagnosis Date  . Thyroid disease   . Cancer 2011    thyroid, Dr. Carlis Abbott   Past Surgical History  Procedure Laterality Date  . Gallbladder surgery  2000  . Knee surgery  2003/ 2010    right knee x2 Dr. Marry Guan for torn meniscus  . Shoulder surgery  1990    right, arthritis  . Thyroidectomy  2011    right side   scoliosis Family History  Problem Relation Age of Onset  . Stroke Mother   . Heart disease Father   . Cancer Maternal Grandmother     pancreatic   Allergies  Allergen Reactions  . Amoxicillin Hives  . Codeine Nausea Only    weakness     Review of Systems: No headache, visual changes, nausea, vomiting, diarrhea, constipation, dizziness, abdominal pain, skin rash, fevers, chills, night sweats, weight loss, swollen lymph nodes, body aches, joint swelling, muscle aches, chest pain, shortness of breath, mood changes.   Objective Blood pressure 102/64, pulse 85, height 5\' 2"  (1.575 m), weight 135 lb (61.236 kg), SpO2 96 %.  General: No apparent distress alert and oriented x3 mood and affect normal, dressed appropriately.  HEENT: Pupils equal,  extraocular movements intact  Respiratory: Patient's speak in full sentences and does not appear short of breath  Cardiovascular: No lower extremity edema, non tender, no erythema  Skin: Warm dry intact with no signs of infection or rash on extremities or on axial skeleton.  Abdomen: Soft nontender  Neuro: Cranial nerves II through XII are intact, neurovascularly intact in all extremities with 2+ DTRs and 2+ pulses.  Lymph: No lymphadenopathy of posterior or anterior cervical chain or axillae bilaterally.  Gait normal with good balance and coordination.  MSK:  Non tender with full range of motion and good stability and symmetric strength and tone of  elbows, wrist, hip, knee and ankles bilaterally.  Shoulder: left Inspection reveals no abnormalities, atrophy or asymmetry. Palpation is normal with no tenderness over AC joint or bicipital groove. ROM is full in all planes passively. Rotator cuff strength normal throughout. Minimal impingement Speeds and Yergason's tests normal. No labral pathology noted with negative Obrien's, negative clunk and good stability. Normal scapular function observed. No painful arc and no drop arm sign. No apprehension sign significant improvement from previous exam Contralateral side unremarkable   Impression and Recommendations:     This case required medical decision making of moderate complexity.

## 2014-11-30 NOTE — Assessment & Plan Note (Signed)
Patient is doing much better after injection as well as responding to conservative therapy. We discussed icing regimen. Patient will continue the exercises 2-3 times a week for the next 6 weeks. Patient in follow-up on an as needed.

## 2014-11-30 NOTE — Patient Instructions (Signed)
Good to see you Ice is still your friend when needed Exercises still 2-3 times a week for another 6 weeks Continue the vitamins See me when you need me.

## 2015-05-17 ENCOUNTER — Other Ambulatory Visit: Payer: Self-pay | Admitting: Internal Medicine

## 2015-05-17 DIAGNOSIS — Z1231 Encounter for screening mammogram for malignant neoplasm of breast: Secondary | ICD-10-CM

## 2015-05-29 ENCOUNTER — Other Ambulatory Visit: Payer: Self-pay | Admitting: Internal Medicine

## 2015-05-29 ENCOUNTER — Ambulatory Visit
Admission: RE | Admit: 2015-05-29 | Discharge: 2015-05-29 | Disposition: A | Discharge status: Home / Self Care | Payer: 59 | Source: Ambulatory Visit | Attending: Internal Medicine | Admitting: Internal Medicine

## 2015-05-29 DIAGNOSIS — Z1231 Encounter for screening mammogram for malignant neoplasm of breast: Secondary | ICD-10-CM

## 2015-07-03 ENCOUNTER — Other Ambulatory Visit: Payer: Self-pay | Admitting: Internal Medicine

## 2015-09-11 DIAGNOSIS — M7542 Impingement syndrome of left shoulder: Secondary | ICD-10-CM | POA: Diagnosis not present

## 2015-09-11 DIAGNOSIS — G8929 Other chronic pain: Secondary | ICD-10-CM | POA: Diagnosis not present

## 2015-09-11 DIAGNOSIS — M25512 Pain in left shoulder: Secondary | ICD-10-CM | POA: Diagnosis not present

## 2015-11-01 DIAGNOSIS — M25512 Pain in left shoulder: Secondary | ICD-10-CM | POA: Diagnosis not present

## 2015-11-01 DIAGNOSIS — G8929 Other chronic pain: Secondary | ICD-10-CM | POA: Diagnosis not present

## 2015-11-01 DIAGNOSIS — M7542 Impingement syndrome of left shoulder: Secondary | ICD-10-CM | POA: Diagnosis not present

## 2015-11-02 ENCOUNTER — Other Ambulatory Visit: Payer: Self-pay | Admitting: Orthopedic Surgery

## 2015-11-02 DIAGNOSIS — M25512 Pain in left shoulder: Secondary | ICD-10-CM

## 2015-11-07 ENCOUNTER — Ambulatory Visit
Admission: RE | Admit: 2015-11-07 | Discharge: 2015-11-07 | Disposition: A | Discharge status: Home / Self Care | Payer: 59 | Source: Ambulatory Visit | Attending: Orthopedic Surgery | Admitting: Orthopedic Surgery

## 2015-11-07 DIAGNOSIS — G8929 Other chronic pain: Secondary | ICD-10-CM | POA: Diagnosis not present

## 2015-11-07 DIAGNOSIS — M7552 Bursitis of left shoulder: Secondary | ICD-10-CM | POA: Insufficient documentation

## 2015-11-07 DIAGNOSIS — M75122 Complete rotator cuff tear or rupture of left shoulder, not specified as traumatic: Secondary | ICD-10-CM | POA: Insufficient documentation

## 2015-11-07 DIAGNOSIS — M19012 Primary osteoarthritis, left shoulder: Secondary | ICD-10-CM | POA: Diagnosis not present

## 2015-11-07 DIAGNOSIS — M25512 Pain in left shoulder: Secondary | ICD-10-CM | POA: Diagnosis not present

## 2015-11-19 ENCOUNTER — Encounter: Payer: 59 | Admitting: Internal Medicine

## 2015-11-19 ENCOUNTER — Other Ambulatory Visit: Payer: 59

## 2015-11-21 ENCOUNTER — Telehealth: Payer: Self-pay | Admitting: *Deleted

## 2015-11-21 ENCOUNTER — Other Ambulatory Visit: Payer: Self-pay | Admitting: Family Medicine

## 2015-11-21 ENCOUNTER — Other Ambulatory Visit (INDEPENDENT_AMBULATORY_CARE_PROVIDER_SITE_OTHER): Payer: 59

## 2015-11-21 DIAGNOSIS — Z130 Encounter for screening for diseases of the blood and blood-forming organs and certain disorders involving the immune mechanism: Secondary | ICD-10-CM

## 2015-11-21 DIAGNOSIS — E785 Hyperlipidemia, unspecified: Secondary | ICD-10-CM | POA: Diagnosis not present

## 2015-11-21 DIAGNOSIS — E039 Hypothyroidism, unspecified: Secondary | ICD-10-CM

## 2015-11-21 DIAGNOSIS — R7309 Other abnormal glucose: Secondary | ICD-10-CM

## 2015-11-21 LAB — CBC
HCT: 42 % (ref 36.0–46.0)
Hemoglobin: 14.3 g/dL (ref 12.0–15.0)
MCHC: 34 g/dL (ref 30.0–36.0)
MCV: 88.9 fl (ref 78.0–100.0)
Platelets: 270 10*3/uL (ref 150.0–400.0)
RBC: 4.72 Mil/uL (ref 3.87–5.11)
RDW: 13.6 % (ref 11.5–15.5)
WBC: 9.7 10*3/uL (ref 4.0–10.5)

## 2015-11-21 LAB — COMPREHENSIVE METABOLIC PANEL
ALT: 17 U/L (ref 0–35)
AST: 20 U/L (ref 0–37)
Albumin: 4.3 g/dL (ref 3.5–5.2)
Alkaline Phosphatase: 60 U/L (ref 39–117)
BUN: 20 mg/dL (ref 6–23)
CHLORIDE: 104 meq/L (ref 96–112)
CO2: 30 mEq/L (ref 19–32)
CREATININE: 0.75 mg/dL (ref 0.40–1.20)
Calcium: 9.6 mg/dL (ref 8.4–10.5)
GFR: 82.79 mL/min (ref >60.00)
GLUCOSE: 98 mg/dL (ref 70–99)
Potassium: 4.4 mEq/L (ref 3.5–5.1)
SODIUM: 143 meq/L (ref 135–145)
Total Bilirubin: 0.5 mg/dL (ref 0.2–1.2)
Total Protein: 6.8 g/dL (ref 6.0–8.3)

## 2015-11-21 LAB — LIPID PANEL
CHOL/HDL RATIO: 5
Cholesterol: 201 mg/dL — ABNORMAL HIGH (ref 0–200)
HDL: 39.7 mg/dL (ref >39.00)
LDL CALC: 135 mg/dL — AB (ref 0–99)
NONHDL: 161.28
TRIGLYCERIDES: 132 mg/dL (ref 0.0–149.0)
VLDL: 26.4 mg/dL (ref 0.0–40.0)

## 2015-11-21 LAB — TSH: TSH: 0.4 u[IU]/mL (ref 0.35–4.50)

## 2015-11-21 NOTE — Telephone Encounter (Signed)
Labs and dx?  

## 2015-11-21 NOTE — Telephone Encounter (Signed)
See order(s).

## 2015-11-26 ENCOUNTER — Encounter: Payer: Self-pay | Admitting: Internal Medicine

## 2015-11-26 ENCOUNTER — Ambulatory Visit (INDEPENDENT_AMBULATORY_CARE_PROVIDER_SITE_OTHER): Payer: 59 | Admitting: Internal Medicine

## 2015-11-26 ENCOUNTER — Encounter: Payer: 59 | Admitting: Internal Medicine

## 2015-11-26 VITALS — BP 118/78 | HR 105 | Ht 61.75 in | Wt 131.0 lb

## 2015-11-26 DIAGNOSIS — F4323 Adjustment disorder with mixed anxiety and depressed mood: Secondary | ICD-10-CM | POA: Diagnosis not present

## 2015-11-26 DIAGNOSIS — Z0000 Encounter for general adult medical examination without abnormal findings: Secondary | ICD-10-CM | POA: Diagnosis not present

## 2015-11-26 MED ORDER — ALPRAZOLAM 0.25 MG PO TABS: 0.2500 mg | ORAL_TABLET | Freq: Two times a day (BID) | ORAL | Status: DC | PRN

## 2015-11-26 NOTE — Assessment & Plan Note (Signed)
Roommate diagnosed with metastatic lung cancer. Offered support today. Will add prn Alprazolam for use as needed for severe anxiety only. Discussed potentials risks of this medication.

## 2015-11-26 NOTE — Progress Notes (Signed)
Subjective:    Patient ID: Erin [name], female    DOB: [birth year], 65 y.o.   MRN: [id number]  HPI  65YO female presents for physical exam.  Recently tore left rotator cuff. Followed by Dr. Marry Guan. Plans for surgical repair in future. Difficult time for her. Caring for roommate with metastatic lung cancer.   Wt Readings from Last 3 Encounters:  11/26/15 131 lb (59.421 kg)  11/30/14 135 lb (61.236 kg)  11/15/14 134 lb (60.782 kg)   BP Readings from Last 3 Encounters:  11/26/15 118/78  11/30/14 102/64  11/15/14 109/71    Past Medical History  Diagnosis Date  . Thyroid disease   . Cancer [facility]) 2011    thyroid, Dr. Carlis Abbott   Family History  Problem Relation Age of Onset  . Stroke Mother   . Heart disease Father   . Cancer Maternal Grandmother     pancreatic   Past Surgical History  Procedure Laterality Date  . Gallbladder surgery  2000  . Knee surgery  2003/ 2010    right knee x2 Dr. Marry Guan for torn meniscus  . Shoulder surgery  1990    right, arthritis  . Thyroidectomy  2011    right side   Social History   Social History  . Marital Status: Single    Spouse Name: N/A  . Number of Children: N/A  . Years of Education: N/A   Social History Main Topics  . Smoking status: Never Smoker   . Smokeless tobacco: Never Used  . Alcohol Use: 0.0 oz/week    0 Standard drinks or equivalent per week     Comment: socially  . Drug Use: No  . Sexual Activity: No   Other Topics Concern  . None   Social History Narrative   Lives in [place].      Work - [employer]    Review of Systems  Constitutional: Negative for fever, chills, appetite change, fatigue and unexpected weight change.  Eyes: Negative for visual disturbance.  Respiratory: Negative for shortness of breath.   Cardiovascular: Negative for chest pain and leg swelling.  Gastrointestinal: Negative for nausea, vomiting, abdominal pain, diarrhea and constipation.  Musculoskeletal: Negative for myalgias and  arthralgias.  Skin: Negative for color change and rash.  Neurological: Negative for weakness.  Hematological: Negative for adenopathy. Does not bruise/bleed easily.  Psychiatric/Behavioral: Positive for dysphoric mood. Negative for suicidal ideas and sleep disturbance. The patient is not nervous/anxious.        Objective:    BP 118/78 mmHg  Pulse 105  Ht 5' 1.75" (1.568 m)  Wt 131 lb (59.421 kg)  BMI 24.17 kg/m2  SpO2 97% Physical Exam  Constitutional: She is oriented to person, place, and time. She appears well-developed and well-nourished. No distress.  HENT:  Head: Normocephalic and atraumatic.  Right Ear: External ear normal.  Left Ear: External ear normal.  Nose: Nose normal.  Mouth/Throat: Oropharynx is clear and moist. No oropharyngeal exudate.  Eyes: Conjunctivae are normal. Pupils are equal, round, and reactive to light. Right eye exhibits no discharge. Left eye exhibits no discharge. No scleral icterus.  Neck: Normal range of motion. Neck supple. No tracheal deviation present. No thyromegaly present.  Cardiovascular: Normal rate, regular rhythm, normal heart sounds and intact distal pulses.  Exam reveals no gallop and no friction rub.   No murmur heard. Pulmonary/Chest: Effort normal and breath sounds normal. No accessory muscle usage. No tachypnea. No respiratory distress. She has no decreased breath sounds. She has  no wheezes. She has no rales. She exhibits no tenderness. Right breast exhibits no inverted nipple, no mass, no nipple discharge, no skin change and no tenderness. Left breast exhibits no inverted nipple, no mass, no nipple discharge, no skin change and no tenderness. Breasts are symmetrical.  Abdominal: Soft. Bowel sounds are normal. She exhibits no distension and no mass. There is no tenderness. There is no rebound and no guarding.  Musculoskeletal: Normal range of motion. She exhibits no edema or tenderness.  Lymphadenopathy:    She has no cervical  adenopathy.  Neurological: She is alert and oriented to person, place, and time. No cranial nerve deficit. She exhibits normal muscle tone. Coordination normal.  Skin: Skin is warm and dry. No rash noted. She is not diaphoretic. No erythema. No pallor.  Psychiatric: Her speech is normal and behavior is normal. Judgment and thought content normal. Her mood appears anxious. She exhibits a depressed mood. She expresses no suicidal ideation.          Assessment & Plan:   Problem List Items Addressed This Visit      Unprioritized   Adjustment disorder with mixed anxiety and depressed mood    Roommate diagnosed with metastatic lung cancer. Offered support today. Will add prn Alprazolam for use as needed for severe anxiety only. Discussed potentials risks of this medication.      Routine general medical examination at a health care facility - Primary    General medical exam normal today. Mammogram and PAP UTD. Colonoscopy due this fall. Immunizations UTD except for Zostavax. Labs reviewed. Encouraged healthy diet and exercise.          Return in about 6 months (around 05/27/2016) for Recheck.  Ronette Deter, MD Internal [facility]

## 2015-11-26 NOTE — Patient Instructions (Signed)
Health Maintenance, Female Adopting a healthy lifestyle and getting preventive care can go a long way to promote health and wellness. Talk with your health care provider about what schedule of regular examinations is right for you. This is a good chance for you to check in with your provider about disease prevention and staying healthy. In between checkups, there are plenty of things you can do on your own. Experts have done a lot of research about which lifestyle changes and preventive measures are most likely to keep you healthy. Ask your health care provider for more information. WEIGHT AND DIET  Eat a healthy diet  Be sure to include plenty of vegetables, fruits, low-fat dairy products, and lean protein.  Do not eat a lot of foods high in solid fats, added sugars, or salt.  Get regular exercise. This is one of the most important things you can do for your health.  Most adults should exercise for at least 150 minutes each week. The exercise should increase your heart rate and make you sweat (moderate-intensity exercise).  Most adults should also do strengthening exercises at least twice a week. This is in addition to the moderate-intensity exercise.  Maintain a healthy weight  Body mass index (BMI) is a measurement that can be used to identify possible weight problems. It estimates body fat based on height and weight. Your health care provider can help determine your BMI and help you achieve or maintain a healthy weight.  For females 20 years of age and older:   A BMI below 18.5 is considered underweight.  A BMI of 18.5 to 24.9 is normal.  A BMI of 25 to 29.9 is considered overweight.  A BMI of 30 and above is considered obese.  Watch levels of cholesterol and blood lipids  You should start having your blood tested for lipids and cholesterol at 64 years of age, then have this test every 5 years.  You may need to have your cholesterol levels checked more often if:  Your lipid  or cholesterol levels are high.  You are older than 64 years of age.  You are at high risk for heart disease.  CANCER SCREENING   Lung Cancer  Lung cancer screening is recommended for adults 55-80 years old who are at high risk for lung cancer because of a history of smoking.  A yearly low-dose CT scan of the lungs is recommended for people who:  Currently smoke.  Have quit within the past 15 years.  Have at least a 30-pack-year history of smoking. A pack year is smoking an average of one pack of cigarettes a day for 1 year.  Yearly screening should continue until it has been 15 years since you quit.  Yearly screening should stop if you develop a health problem that would prevent you from having lung cancer treatment.  Breast Cancer  Practice breast self-awareness. This means understanding how your breasts normally appear and feel.  It also means doing regular breast self-exams. Let your health care provider know about any changes, no matter how small.  If you are in your 20s or 30s, you should have a clinical breast exam (CBE) by a health care provider every 1-3 years as part of a regular health exam.  If you are 40 or older, have a CBE every year. Also consider having a breast X-ray (mammogram) every year.  If you have a family history of breast cancer, talk to your health care provider about genetic screening.  If you   are at high risk for breast cancer, talk to your health care provider about having an MRI and a mammogram every year.  Breast cancer gene (BRCA) assessment is recommended for women who have family members with BRCA-related cancers. BRCA-related cancers include:  Breast.  Ovarian.  Tubal.  Peritoneal cancers.  Results of the assessment will determine the need for genetic counseling and BRCA1 and BRCA2 testing. Cervical Cancer Your health care provider may recommend that you be screened regularly for cancer of the pelvic organs (ovaries, uterus, and  vagina). This screening involves a pelvic examination, including checking for microscopic changes to the surface of your cervix (Pap test). You may be encouraged to have this screening done every 3 years, beginning at age 21.  For women ages 30-65, health care providers may recommend pelvic exams and Pap testing every 3 years, or they may recommend the Pap and pelvic exam, combined with testing for human papilloma virus (HPV), every 5 years. Some types of HPV increase your risk of cervical cancer. Testing for HPV may also be done on women of any age with unclear Pap test results.  Other health care providers may not recommend any screening for nonpregnant women who are considered low risk for pelvic cancer and who do not have symptoms. Ask your health care provider if a screening pelvic exam is right for you.  If you have had past treatment for cervical cancer or a condition that could lead to cancer, you need Pap tests and screening for cancer for at least 20 years after your treatment. If Pap tests have been discontinued, your risk factors (such as having a new sexual partner) need to be reassessed to determine if screening should resume. Some women have medical problems that increase the chance of getting cervical cancer. In these cases, your health care provider may recommend more frequent screening and Pap tests. Colorectal Cancer  This type of cancer can be detected and often prevented.  Routine colorectal cancer screening usually begins at 64 years of age and continues through 64 years of age.  Your health care provider may recommend screening at an earlier age if you have risk factors for colon cancer.  Your health care provider may also recommend using home test kits to check for hidden blood in the stool.  A small camera at the end of a tube can be used to examine your colon directly (sigmoidoscopy or colonoscopy). This is done to check for the earliest forms of colorectal  cancer.  Routine screening usually begins at age 50.  Direct examination of the colon should be repeated every 5-10 years through 64 years of age. However, you may need to be screened more often if early forms of precancerous polyps or small growths are found. Skin Cancer  Check your skin from head to toe regularly.  Tell your health care provider about any new moles or changes in moles, especially if there is a change in a mole's shape or color.  Also tell your health care provider if you have a mole that is larger than the size of a pencil eraser.  Always use sunscreen. Apply sunscreen liberally and repeatedly throughout the day.  Protect yourself by wearing long sleeves, pants, a wide-brimmed hat, and sunglasses whenever you are outside. HEART DISEASE, DIABETES, AND HIGH BLOOD PRESSURE   High blood pressure causes heart disease and increases the risk of stroke. High blood pressure is more likely to develop in:  People who have blood pressure in the high end   of the normal range (130-139/85-89 mm Hg).  People who are overweight or obese.  People who are African American.  If you are 38-23 years of age, have your blood pressure checked every 3-5 years. If you are 61 years of age or older, have your blood pressure checked every year. You should have your blood pressure measured twice--once when you are at a hospital or clinic, and once when you are not at a hospital or clinic. Record the average of the two measurements. To check your blood pressure when you are not at a hospital or clinic, you can use:  An automated blood pressure machine at a pharmacy.  A home blood pressure monitor.  If you are between 45 years and 39 years old, ask your health care provider if you should take aspirin to prevent strokes.  Have regular diabetes screenings. This involves taking a blood sample to check your fasting blood sugar level.  If you are at a normal weight and have a low risk for diabetes,  have this test once every three years after 64 years of age.  If you are overweight and have a high risk for diabetes, consider being tested at a younger age or more often. PREVENTING INFECTION  Hepatitis B  If you have a higher risk for hepatitis B, you should be screened for this virus. You are considered at high risk for hepatitis B if:  You were born in a country where hepatitis B is common. Ask your health care provider which countries are considered high risk.  Your parents were born in a high-risk country, and you have not been immunized against hepatitis B (hepatitis B vaccine).  You have HIV or AIDS.  You use needles to inject street drugs.  You live with someone who has hepatitis B.  You have had sex with someone who has hepatitis B.  You get hemodialysis treatment.  You take certain medicines for conditions, including cancer, organ transplantation, and autoimmune conditions. Hepatitis C  Blood testing is recommended for:  Everyone born from [birth year] through 1964.  Anyone with known risk factors for hepatitis C. Sexually transmitted infections (STIs)  You should be screened for sexually transmitted infections (STIs) including gonorrhea and chlamydia if:  You are sexually active and are younger than 64 years of age.  You are older than 64 years of age and your health care provider tells you that you are at risk for this type of infection.  Your sexual activity has changed since you were last screened and you are at an increased risk for chlamydia or gonorrhea. Ask your health care provider if you are at risk.  If you do not have HIV, but are at risk, it may be recommended that you take a prescription medicine daily to prevent HIV infection. This is called pre-exposure prophylaxis (PrEP). You are considered at risk if:  You are sexually active and do not regularly use condoms or know the HIV status of your partner(s).  You take drugs by injection.  You are sexually  active with a partner who has HIV. Talk with your health care provider about whether you are at high risk of being infected with HIV. If you choose to begin PrEP, you should first be tested for HIV. You should then be tested every 3 months for as long as you are taking PrEP.  PREGNANCY   If you are premenopausal and you may become pregnant, ask your health care provider about preconception counseling.  If you may  become pregnant, take 400 to 800 micrograms (mcg) of folic acid every day.  If you want to prevent pregnancy, talk to your health care provider about birth control (contraception). OSTEOPOROSIS AND MENOPAUSE   Osteoporosis is a disease in which the bones lose minerals and strength with aging. This can result in serious bone fractures. Your risk for osteoporosis can be identified using a bone density scan.  If you are 61 years of age or older, or if you are at risk for osteoporosis and fractures, ask your health care provider if you should be screened.  Ask your health care provider whether you should take a calcium or vitamin D supplement to lower your risk for osteoporosis.  Menopause may have certain physical symptoms and risks.  Hormone replacement therapy may reduce some of these symptoms and risks. Talk to your health care provider about whether hormone replacement therapy is right for you.  HOME CARE INSTRUCTIONS   Schedule regular health, dental, and eye exams.  Stay current with your immunizations.   Do not use any tobacco products including cigarettes, chewing tobacco, or electronic cigarettes.  If you are pregnant, do not drink alcohol.  If you are breastfeeding, limit how much and how often you drink alcohol.  Limit alcohol intake to no more than 1 drink per day for nonpregnant women. One drink equals 12 ounces of beer, 5 ounces of wine, or 1 ounces of hard liquor.  Do not use street drugs.  Do not share needles.  Ask your health care provider for help if  you need support or information about quitting drugs.  Tell your health care provider if you often feel depressed.  Tell your health care provider if you have ever been abused or do not feel safe at home.   This information is not intended to replace advice given to you by your health care provider. Make sure you discuss any questions you have with your health care provider.   Document Released: 02/03/2011 Document Revised: 08/11/2014 Document Reviewed: 06/22/2013 Elsevier Interactive Patient Education [employer].

## 2015-11-26 NOTE — Assessment & Plan Note (Signed)
General medical exam normal today. Mammogram and PAP UTD. Colonoscopy due this fall. Immunizations UTD except for Zostavax. Labs reviewed. Encouraged healthy diet and exercise.

## 2015-12-25 ENCOUNTER — Other Ambulatory Visit: Payer: Self-pay | Admitting: Internal Medicine

## 2016-01-04 ENCOUNTER — Other Ambulatory Visit: Payer: Self-pay | Admitting: Internal Medicine

## 2016-01-18 DIAGNOSIS — E890 Postprocedural hypothyroidism: Secondary | ICD-10-CM | POA: Diagnosis not present

## 2016-01-18 DIAGNOSIS — E063 Autoimmune thyroiditis: Secondary | ICD-10-CM | POA: Diagnosis not present

## 2016-01-18 DIAGNOSIS — K229 Disease of esophagus, unspecified: Secondary | ICD-10-CM | POA: Diagnosis not present

## 2016-01-18 DIAGNOSIS — C73 Malignant neoplasm of thyroid gland: Secondary | ICD-10-CM | POA: Diagnosis not present

## 2016-01-25 DIAGNOSIS — E890 Postprocedural hypothyroidism: Secondary | ICD-10-CM | POA: Diagnosis not present

## 2016-01-25 DIAGNOSIS — K229 Disease of esophagus, unspecified: Secondary | ICD-10-CM | POA: Diagnosis not present

## 2016-01-25 DIAGNOSIS — C73 Malignant neoplasm of thyroid gland: Secondary | ICD-10-CM | POA: Diagnosis not present

## 2016-05-08 ENCOUNTER — Other Ambulatory Visit: Payer: Self-pay | Admitting: Family Medicine

## 2016-05-08 DIAGNOSIS — Z1231 Encounter for screening mammogram for malignant neoplasm of breast: Secondary | ICD-10-CM

## 2016-06-02 ENCOUNTER — Ambulatory Visit
Admission: RE | Admit: 2016-06-02 | Discharge: 2016-06-02 | Disposition: A | Discharge status: Home / Self Care | Payer: 59 | Source: Ambulatory Visit | Attending: Family Medicine | Admitting: Family Medicine

## 2016-06-02 DIAGNOSIS — Z1231 Encounter for screening mammogram for malignant neoplasm of breast: Secondary | ICD-10-CM | POA: Diagnosis not present

## 2016-06-16 DIAGNOSIS — H524 Presbyopia: Secondary | ICD-10-CM | POA: Diagnosis not present

## 2016-06-20 ENCOUNTER — Telehealth: Payer: Self-pay | Admitting: Family Medicine

## 2016-06-20 MED ORDER — ALPRAZOLAM 0.25 MG PO TABS
0.2500 mg | ORAL_TABLET | Freq: Two times a day (BID) | ORAL | 0 refills | Status: DC | PRN
Start: 2016-06-20 — End: 2017-05-28

## 2016-06-20 MED ORDER — AMITRIPTYLINE HCL 50 MG PO TABS: ORAL_TABLET | ORAL | 1 refills | Status: DC

## 2016-06-20 NOTE — Telephone Encounter (Signed)
Pt called and is requesting a refill on amitriptyline (ELAVIL) 50 MG tablet, she did not need the ALPRAZolam (XANAX) 0.25 MG tablet.  Pharmacy - [facility], [address]  Call pt @ [phone]

## 2016-06-20 NOTE — Telephone Encounter (Signed)
Please advise on refill. She has not been seen since 11/2015 by Dr. Gilford Rile and scheduled with you in 11/2016.

## 2016-06-20 NOTE — Telephone Encounter (Signed)
Sent to pharmacy 

## 2016-06-20 NOTE — Telephone Encounter (Signed)
Pt called back returning your call. Explained that she needed to come in for an appointment sooner than April. Pt stated that she only comes in once and year, tried to explain that it is most likely because she has not established with Dr. Caryl Bis yet. Pt would like you to call her. Thank you!  [phone] may leave a message

## 2016-06-20 NOTE — Telephone Encounter (Signed)
Will you refill this

## 2016-06-20 NOTE — Telephone Encounter (Signed)
Pt states she needs a refill on ALPRAZolam Duanne Moron) 0.25 MG tablet sent to [facility].. Please advise pt at [phone]

## 2016-06-20 NOTE — Telephone Encounter (Signed)
Refill given. Patient needs to follow-up for further refills.

## 2016-06-20 NOTE — Telephone Encounter (Signed)
LM for patient to call the office. Patient needs to schedule appointment with Dr. Caryl Bis sooner than April 2018

## 2016-06-20 NOTE — Telephone Encounter (Signed)
I have scheduled patient to come in for visit on 07/09/16. I am going to shred the Xanax RX due to her not needing at this time. Please refill amitriptyline.

## 2016-07-09 ENCOUNTER — Ambulatory Visit (INDEPENDENT_AMBULATORY_CARE_PROVIDER_SITE_OTHER): Payer: 59 | Admitting: Family Medicine

## 2016-07-09 ENCOUNTER — Encounter: Payer: Self-pay | Admitting: Family Medicine

## 2016-07-09 VITALS — BP 98/64 | HR 89 | Temp 98.3°F | Wt 138.2 lb

## 2016-07-09 DIAGNOSIS — Z1211 Encounter for screening for malignant neoplasm of colon: Secondary | ICD-10-CM

## 2016-07-09 DIAGNOSIS — M542 Cervicalgia: Secondary | ICD-10-CM | POA: Insufficient documentation

## 2016-07-09 DIAGNOSIS — Z13820 Encounter for screening for osteoporosis: Secondary | ICD-10-CM

## 2016-07-09 DIAGNOSIS — E039 Hypothyroidism, unspecified: Secondary | ICD-10-CM | POA: Diagnosis not present

## 2016-07-09 DIAGNOSIS — M8580 Other specified disorders of bone density and structure, unspecified site: Secondary | ICD-10-CM

## 2016-07-09 DIAGNOSIS — F4323 Adjustment disorder with mixed anxiety and depressed mood: Secondary | ICD-10-CM

## 2016-07-09 DIAGNOSIS — K219 Gastro-esophageal reflux disease without esophagitis: Secondary | ICD-10-CM | POA: Diagnosis not present

## 2016-07-09 NOTE — Assessment & Plan Note (Signed)
Repeat DEXA scan ordered. Continue vitamin D. Encouraged calcium intake.

## 2016-07-09 NOTE — Progress Notes (Signed)
[name], MD Phone: [phone]  Erin [name] is a 65 y.o. female who presents today for follow-up.  Grief: Patient notes she lost her partner recently. Has had some issues with grief and has dealt with this fairly well. Has spoken with friends and family regarding this. Takes amitriptyline chronically to help her sleep. Has not had use any Xanax for this. No depression. Feels that she is moving past the worst of it.  Hypothyroidism: Taking Synthroid daily. Had part of her thyroid removed due to thyroid cancer. Follows with endocrinology for this. Has had a recent TSH and ultrasound in the last 6 months. No weight changes. No skin changes. No cold or heat intolerance.  GERD: Taking Prevacid. Gets some gas and belching though no burning. Some sour taste. He had an ulcer. No stomach pain with this. No blood in her stool. She is due for colonoscopy.  Osteopenia: Seen on prior DEXA scan. Not currently taking calcium. Taking vitamin D.  She notes occasional right-sided muscular neck discomfort. Comes out of nowhere. Can occur after she has been especially active. Resolves with anti-inflammatory use. No radiation. No numbness or weakness.  PMH: nonsmoker.   ROS see history of present illness  Objective  Physical Exam Vitals:   07/09/16 1451  BP: 98/64  Pulse: 89  Temp: 98.3 F (36.8 C)    BP Readings from Last 3 Encounters:  07/09/16 98/64  11/26/15 118/78  11/30/14 102/64   Wt Readings from Last 3 Encounters:  07/09/16 138 lb 3.2 oz (62.7 kg)  11/26/15 131 lb (59.4 kg)  11/30/14 135 lb (61.2 kg)    Physical Exam  Constitutional: No distress.  HENT:  Head: Normocephalic and atraumatic.  Cardiovascular: Normal rate, regular rhythm and normal heart sounds.   Pulmonary/Chest: Effort normal and breath sounds normal.  Musculoskeletal: She exhibits no edema.  No midline neck tenderness, no midline neck step-off, no muscular neck tenderness, no trapezius tenderness    Neurological: She is alert. Gait normal.  5/5 strength in bilateral biceps, triceps, grip, quads, hamstrings, plantar and dorsiflexion, sensation to light touch intact in bilateral UE and LE, normal gait  Skin: Skin is warm and dry. She is not diaphoretic.  Psychiatric:  Mood sad, affect intermittent tearful     Assessment/Plan: Please see individual problem list.  GERD (gastroesophageal reflux disease) Overall well controlled with Prevacid. She appears to be due for her colonoscopy. We'll refer back to GI.  Hypothyroidism Doing well symptomatically. Follows with Dr Carmela Rima for this. Continue to follow with them.  Osteopenia Repeat DEXA scan ordered. Continue vitamin D. Encouraged calcium intake.  Adjustment disorder with mixed anxiety and depressed mood Mostly grief at this time. No anxiety or depression at this time. Continue amitriptyline. Offered support. Continue to monitor.  Neck pain on right side Suspect muscular strain versus arthritis. Benign exam today. Neurologically intact. Discussed stretching and heat. Continue anti-inflammatories as needed and as tolerated given reflux. She'll continue to monitor. Given return precautions.   Orders Placed This Encounter  Procedures  . DG Bone Density    Standing Status:   Future    Standing Expiration Date:   09/09/2017    Order Specific Question:   Reason for Exam (SYMPTOM  OR DIAGNOSIS REQUIRED)    Answer:   osteoporosis screening    Order Specific Question:   Preferred imaging location?    Answer:   [facility]  . Ambulatory referral to Gastroenterology    Referral Priority:   Routine  Referral Type:   Consultation    Referral Reason:   Specialty Services Required    Number of Visits Requested:   [place], MD [place]

## 2016-07-09 NOTE — Assessment & Plan Note (Signed)
Doing well symptomatically. Follows with Dr Carmela Rima for this. Continue to follow with them.

## 2016-07-09 NOTE — Assessment & Plan Note (Signed)
Suspect muscular strain versus arthritis. Benign exam today. Neurologically intact. Discussed stretching and heat. Continue anti-inflammatories as needed and as tolerated given reflux. She'll continue to monitor. Given return precautions.

## 2016-07-09 NOTE — Patient Instructions (Signed)
Nice to see you. Please continue to follow-up with your endocrinologist for your thyroid. Please start taking calcium 800 mg daily. Also continue vitamin D supplementation. We will get a bone density test ordered. We'll also get you set up for colonoscopy.

## 2016-07-09 NOTE — Assessment & Plan Note (Signed)
Mostly grief at this time. No anxiety or depression at this time. Continue amitriptyline. Offered support. Continue to monitor.

## 2016-07-09 NOTE — Assessment & Plan Note (Signed)
Overall well controlled with Prevacid. She appears to be due for her colonoscopy. We'll refer back to GI.

## 2016-08-06 ENCOUNTER — Encounter: Payer: Self-pay | Admitting: Family Medicine

## 2016-08-25 DIAGNOSIS — Z8371 Family history of colonic polyps: Secondary | ICD-10-CM | POA: Diagnosis not present

## 2016-08-25 DIAGNOSIS — Z1211 Encounter for screening for malignant neoplasm of colon: Secondary | ICD-10-CM | POA: Diagnosis not present

## 2016-08-26 DIAGNOSIS — D2262 Melanocytic nevi of left upper limb, including shoulder: Secondary | ICD-10-CM | POA: Diagnosis not present

## 2016-08-26 DIAGNOSIS — Z85828 Personal history of other malignant neoplasm of skin: Secondary | ICD-10-CM | POA: Diagnosis not present

## 2016-08-26 DIAGNOSIS — D485 Neoplasm of uncertain behavior of skin: Secondary | ICD-10-CM | POA: Diagnosis not present

## 2016-08-26 DIAGNOSIS — L570 Actinic keratosis: Secondary | ICD-10-CM | POA: Diagnosis not present

## 2016-08-26 DIAGNOSIS — D2271 Melanocytic nevi of right lower limb, including hip: Secondary | ICD-10-CM | POA: Diagnosis not present

## 2016-08-26 DIAGNOSIS — X32XXXA Exposure to sunlight, initial encounter: Secondary | ICD-10-CM | POA: Diagnosis not present

## 2016-08-26 DIAGNOSIS — D2272 Melanocytic nevi of left lower limb, including hip: Secondary | ICD-10-CM | POA: Diagnosis not present

## 2016-08-27 DIAGNOSIS — C44712 Basal cell carcinoma of skin of right lower limb, including hip: Secondary | ICD-10-CM | POA: Diagnosis not present

## 2016-08-27 DIAGNOSIS — C44612 Basal cell carcinoma of skin of right upper limb, including shoulder: Secondary | ICD-10-CM | POA: Diagnosis not present

## 2016-09-15 ENCOUNTER — Ambulatory Visit
Admission: RE | Admit: 2016-09-15 | Discharge: 2016-09-15 | Disposition: A | Discharge status: Home / Self Care | Payer: 59 | Source: Ambulatory Visit | Attending: Family Medicine | Admitting: Family Medicine

## 2016-09-15 DIAGNOSIS — Z13820 Encounter for screening for osteoporosis: Secondary | ICD-10-CM | POA: Diagnosis not present

## 2016-09-15 DIAGNOSIS — M8589 Other specified disorders of bone density and structure, multiple sites: Secondary | ICD-10-CM | POA: Diagnosis not present

## 2016-09-15 DIAGNOSIS — M85852 Other specified disorders of bone density and structure, left thigh: Secondary | ICD-10-CM | POA: Diagnosis not present

## 2016-09-25 DIAGNOSIS — R194 Change in bowel habit: Secondary | ICD-10-CM | POA: Diagnosis not present

## 2016-09-25 DIAGNOSIS — Z1211 Encounter for screening for malignant neoplasm of colon: Secondary | ICD-10-CM | POA: Diagnosis not present

## 2016-09-29 ENCOUNTER — Telehealth: Payer: Self-pay | Admitting: *Deleted

## 2016-09-29 NOTE — Telephone Encounter (Signed)
Pt requested a medication for motion sickness, she will be going on a cruise.  Pt contact [place]

## 2016-09-29 NOTE — Telephone Encounter (Signed)
Please advise 

## 2016-09-30 MED ORDER — SCOPOLAMINE 1 MG/3DAYS TD PT72: 1.0000 | MEDICATED_PATCH | TRANSDERMAL | 0 refills | Status: DC | PRN

## 2016-09-30 NOTE — Telephone Encounter (Signed)
Pt called for an update. Pt is leaving Friday, she asked to be called when completed. Please advise, thank you!  Call pt @ [phone] (may leave msg)

## 2016-09-30 NOTE — Telephone Encounter (Signed)
Please advise 

## 2016-09-30 NOTE — Telephone Encounter (Signed)
Sent to pharmacy 

## 2016-10-17 DIAGNOSIS — C44612 Basal cell carcinoma of skin of right upper limb, including shoulder: Secondary | ICD-10-CM | POA: Diagnosis not present

## 2016-10-17 DIAGNOSIS — L905 Scar conditions and fibrosis of skin: Secondary | ICD-10-CM | POA: Diagnosis not present

## 2016-10-30 DIAGNOSIS — C44712 Basal cell carcinoma of skin of right lower limb, including hip: Secondary | ICD-10-CM | POA: Diagnosis not present

## 2016-11-18 ENCOUNTER — Telehealth: Payer: Self-pay | Admitting: Radiology

## 2016-11-18 DIAGNOSIS — E039 Hypothyroidism, unspecified: Secondary | ICD-10-CM

## 2016-11-18 DIAGNOSIS — Z130 Encounter for screening for diseases of the blood and blood-forming organs and certain disorders involving the immune mechanism: Secondary | ICD-10-CM

## 2016-11-18 DIAGNOSIS — Z13220 Encounter for screening for lipoid disorders: Secondary | ICD-10-CM

## 2016-11-18 NOTE — Telephone Encounter (Signed)
Please find out what lab work she is coming in for. The best I can tell is that it would be related to a physical exam. Thanks.

## 2016-11-18 NOTE — Telephone Encounter (Signed)
Pt coming in for labs tomorrow, please place future orders. Thank you.  

## 2016-11-19 ENCOUNTER — Other Ambulatory Visit (INDEPENDENT_AMBULATORY_CARE_PROVIDER_SITE_OTHER): Payer: 59

## 2016-11-19 DIAGNOSIS — Z130 Encounter for screening for diseases of the blood and blood-forming organs and certain disorders involving the immune mechanism: Secondary | ICD-10-CM

## 2016-11-19 DIAGNOSIS — E039 Hypothyroidism, unspecified: Secondary | ICD-10-CM

## 2016-11-19 DIAGNOSIS — Z13220 Encounter for screening for lipoid disorders: Secondary | ICD-10-CM | POA: Diagnosis not present

## 2016-11-19 LAB — LIPID PANEL
CHOLESTEROL: 197 mg/dL (ref 0–200)
HDL: 38.1 mg/dL — ABNORMAL LOW (ref >39.00)
LDL Cholesterol: 125 mg/dL — ABNORMAL HIGH (ref 0–99)
NonHDL: 158.58
TRIGLYCERIDES: 168 mg/dL — AB (ref 0.0–149.0)
Total CHOL/HDL Ratio: 5
VLDL: 33.6 mg/dL (ref 0.0–40.0)

## 2016-11-19 LAB — COMPREHENSIVE METABOLIC PANEL
ALBUMIN: 4.3 g/dL (ref 3.5–5.2)
ALK PHOS: 57 U/L (ref 39–117)
ALT: 18 U/L (ref 0–35)
AST: 20 U/L (ref 0–37)
BILIRUBIN TOTAL: 0.4 mg/dL (ref 0.2–1.2)
BUN: 17 mg/dL (ref 6–23)
CALCIUM: 9.3 mg/dL (ref 8.4–10.5)
CHLORIDE: 106 meq/L (ref 96–112)
CO2: 30 mEq/L (ref 19–32)
CREATININE: 0.77 mg/dL (ref 0.40–1.20)
GFR: 80.06 mL/min (ref >60.00)
Glucose, Bld: 97 mg/dL (ref 70–99)
Potassium: 4.1 mEq/L (ref 3.5–5.1)
Sodium: 142 mEq/L (ref 135–145)
TOTAL PROTEIN: 6.8 g/dL (ref 6.0–8.3)

## 2016-11-19 LAB — CBC
HEMATOCRIT: 41.8 % (ref 36.0–46.0)
HEMOGLOBIN: 14.2 g/dL (ref 12.0–15.0)
MCHC: 34 g/dL (ref 30.0–36.0)
MCV: 90.5 fl (ref 78.0–100.0)
Platelets: 257 10*3/uL (ref 150.0–400.0)
RBC: 4.61 Mil/uL (ref 3.87–5.11)
RDW: 13.3 % (ref 11.5–15.5)
WBC: 8.5 10*3/uL (ref 4.0–10.5)

## 2016-11-19 LAB — TSH: TSH: 0.92 u[IU]/mL (ref 0.35–4.50)

## 2016-11-19 NOTE — Telephone Encounter (Signed)
Pt here for labs, message sent after labs staff left for the day.

## 2016-11-19 NOTE — Telephone Encounter (Signed)
Orders placed. Thanks.

## 2016-11-19 NOTE — Addendum Note (Signed)
Addended by: Leone Haven on: 11/19/2016 08:06 AM   Modules accepted: Orders

## 2016-11-26 ENCOUNTER — Encounter: Payer: Self-pay | Admitting: Family Medicine

## 2016-11-26 ENCOUNTER — Ambulatory Visit (INDEPENDENT_AMBULATORY_CARE_PROVIDER_SITE_OTHER): Payer: 59 | Admitting: Family Medicine

## 2016-11-26 DIAGNOSIS — Z0000 Encounter for general adult medical examination without abnormal findings: Secondary | ICD-10-CM

## 2016-11-26 NOTE — Progress Notes (Signed)
Pre visit review using our clinic review tool, if applicable. No additional management support is needed unless otherwise documented below in the visit note. 

## 2016-11-26 NOTE — Patient Instructions (Signed)
Nice to see you. Please check with employee health regarding your tetanus vaccination. Please start exercising as we discussed. Please monitor your diet. Please keep your appointment for colonoscopy. I would encourage you to keep follow-up with your gynecologist for pelvic exam and potential Pap smear this year.

## 2016-11-26 NOTE — Assessment & Plan Note (Signed)
Physical exam completed. Overall patient is doing well. Weight is in a healthy range. Blood pressures in the good range. Discussed labs with patient. Encouraged diet and exercise. Encouraged her to follow-up with her gynecologist for repeat Pap smear if needed. She will contact them to set up an appointment. We'll request the pap records from them. Mammogram up-to-date. Colonoscopy already scheduled. Declines testing for hepatitis C and HIV. She'll get a tetanus vaccination through employee health.

## 2016-11-26 NOTE — Progress Notes (Signed)
[name], MD Phone: [phone]  Erin [name] is a 66 y.o. female who presents today for physical exam.  Does not exercise much. Plan start exercising by using the pool. Does walk occasionally. Diet is fairly healthy. Tries to eat vegetables for lunch. PB injury. Does eat out for dinner at times with eating [place] food. Mostly water. Pap smears through gynecology. It appears last one done through our office was not satisfactory. Notes it's been a little over a year since she saw her gynecologist. Mammograms up-to-date. Colonoscopy scheduled for May. Next line of prior hepatitis C or HIV testing. She is due for tetanus vaccination. No tobacco use or illicit drug use. Socially drinks alcohol.  Active Ambulatory Problems    Diagnosis Date Noted  . GERD (gastroesophageal reflux disease) 04/19/2012  . Hyperlipidemia LDL goal < 100 04/19/2012  . Hypothyroidism 04/19/2012  . Routine general medical examination at a health care facility 10/20/2012  . Cervical cancer screening 04/25/2013  . Osteopenia 06/08/2013  . Arthralgia 08/11/2013  . Right hip pain 10/24/2013  . Adjustment disorder with mixed anxiety and depressed mood 11/26/2015  . Neck pain on right side 07/09/2016   Resolved Ambulatory Problems    Diagnosis Date Noted  . Shingles rash 04/27/2012  . Viral URI with cough 04/25/2013  . Shoulder bursitis 11/06/2014   Past Medical History:  Diagnosis Date  . Cancer ([place]) 2011  . Thyroid disease     Family History  Problem Relation Age of Onset  . Stroke Mother   . Heart disease Father   . Cancer Maternal Grandmother     pancreatic    Social History   Social History  . Marital status: Single    Spouse name: N/A  . Number of children: N/A  . Years of education: N/A   Occupational History  . Not on file.   Social History Main Topics  . Smoking status: Never Smoker  . Smokeless tobacco: Never Used  . Alcohol use 0.0 oz/week     Comment: socially  .  Drug use: No  . Sexual activity: No   Other Topics Concern  . Not on file   Social History Narrative   Lives in [place].      Work - [place]:  Negative for unexplained weight loss, fever Skin: Negative for new or changing mole, sore that won't heal HEENT: Negative for trouble hearing, trouble seeing, ringing in ears, mouth sores, hoarseness, change in voice, dysphagia. CV:  Negative for chest pain, dyspnea, edema, palpitations Resp: Negative for cough, dyspnea, hemoptysis GI: Negative for nausea, vomiting, diarrhea, constipation, abdominal pain, melena, hematochezia. GU: Negative for dysuria, incontinence, urinary hesitance, hematuria, vaginal or penile discharge, polyuria, sexual difficulty, lumps in testicle or breasts MSK: Negative for muscle cramps or aches, joint pain or swelling Neuro: Negative for headaches, weakness, numbness, dizziness, passing out/fainting Psych: Negative for depression, anxiety, memory problems  Objective  Physical Exam Vitals:   11/26/16 0802  BP: 100/80  Pulse: 89  Temp: 98.1 F (36.7 C)    BP Readings from Last 3 Encounters:  11/26/16 100/80  07/09/16 98/64  11/26/15 118/78   Wt Readings from Last 3 Encounters:  11/26/16 138 lb 3.2 oz (62.7 kg)  07/09/16 138 lb 3.2 oz (62.7 kg)  11/26/15 131 lb (59.4 kg)    Physical Exam  Constitutional: She is well-developed, well-nourished, and in no distress.  HENT:  Head: Normocephalic and atraumatic.  Mouth/Throat: Oropharynx is clear and moist.  No oropharyngeal exudate.  Eyes: Conjunctivae are normal. Pupils are equal, round, and reactive to light.  Cardiovascular: Normal rate, regular rhythm and normal heart sounds.   Pulmonary/Chest: Effort normal and breath sounds normal.  Abdominal: Soft. Bowel sounds are normal. She exhibits no distension. There is no tenderness. There is no rebound and no guarding.  Genitourinary:  Genitourinary Comments: Bilateral breasts with no skin  changes, masses, or tenderness, no axillary masses bilaterally  Musculoskeletal: She exhibits no edema.  Neurological: She is alert. Gait normal.  Skin: Skin is warm and dry.  Psychiatric: Mood and affect normal.     Assessment/Plan:   Routine general medical examination at a health care facility Physical exam completed. Overall patient is doing well. Weight is in a healthy range. Blood pressures in the good range. Discussed labs with patient. Encouraged diet and exercise. Encouraged her to follow-up with her gynecologist for repeat Pap smear if needed. She will contact them to set up an appointment. We'll request the pap records from them. Mammogram up-to-date. Colonoscopy already scheduled. Declines testing for hepatitis C and HIV. She'll get a tetanus vaccination through employee health.   [name], MD [place]

## 2016-12-18 ENCOUNTER — Other Ambulatory Visit: Payer: Self-pay | Admitting: Family Medicine

## 2016-12-22 ENCOUNTER — Encounter: Payer: Self-pay | Admitting: *Deleted

## 2016-12-23 ENCOUNTER — Encounter
Admission: RE | Disposition: A | Discharge status: Home / Self Care | Payer: Self-pay | Source: Ambulatory Visit | Attending: Gastroenterology

## 2016-12-23 ENCOUNTER — Encounter: Payer: Self-pay | Admitting: *Deleted

## 2016-12-23 ENCOUNTER — Ambulatory Visit
Admission: RE | Admit: 2016-12-23 | Discharge: 2016-12-23 | Disposition: A | Discharge status: Home / Self Care | Payer: 59 | Source: Ambulatory Visit | Attending: Gastroenterology | Admitting: Gastroenterology

## 2016-12-23 ENCOUNTER — Ambulatory Visit: Payer: 59 | Admitting: Certified Registered Nurse Anesthetist

## 2016-12-23 DIAGNOSIS — Z8371 Family history of colonic polyps: Secondary | ICD-10-CM | POA: Insufficient documentation

## 2016-12-23 DIAGNOSIS — D122 Benign neoplasm of ascending colon: Secondary | ICD-10-CM | POA: Insufficient documentation

## 2016-12-23 DIAGNOSIS — Z79899 Other long term (current) drug therapy: Secondary | ICD-10-CM | POA: Diagnosis not present

## 2016-12-23 DIAGNOSIS — K219 Gastro-esophageal reflux disease without esophagitis: Secondary | ICD-10-CM | POA: Diagnosis not present

## 2016-12-23 DIAGNOSIS — K635 Polyp of colon: Secondary | ICD-10-CM | POA: Diagnosis not present

## 2016-12-23 DIAGNOSIS — E039 Hypothyroidism, unspecified: Secondary | ICD-10-CM | POA: Insufficient documentation

## 2016-12-23 DIAGNOSIS — Z1211 Encounter for screening for malignant neoplasm of colon: Secondary | ICD-10-CM | POA: Insufficient documentation

## 2016-12-23 DIAGNOSIS — Q439 Congenital malformation of intestine, unspecified: Secondary | ICD-10-CM | POA: Diagnosis not present

## 2016-12-23 HISTORY — PX: COLONOSCOPY WITH PROPOFOL: SHX5780

## 2016-12-23 SURGERY — COLONOSCOPY WITH PROPOFOL
Anesthesia: General

## 2016-12-23 MED ORDER — PROPOFOL 10 MG/ML IV BOLUS
INTRAVENOUS | Status: DC | PRN
  Administered 2016-12-23: 10 mg via INTRAVENOUS
  Administered 2016-12-23: 40 mg via INTRAVENOUS
  Administered 2016-12-23: 20 mg via INTRAVENOUS

## 2016-12-23 MED ORDER — SODIUM CHLORIDE 0.9 % IV SOLN: INTRAVENOUS | Status: DC

## 2016-12-23 MED ORDER — LIDOCAINE HCL (CARDIAC) 20 MG/ML IV SOLN
INTRAVENOUS | Status: DC | PRN
  Administered 2016-12-23: 50 mg via INTRAVENOUS

## 2016-12-23 MED ORDER — PROPOFOL 500 MG/50ML IV EMUL
INTRAVENOUS | Status: DC | PRN
  Administered 2016-12-23: 140 ug/kg/min via INTRAVENOUS

## 2016-12-23 MED ORDER — MIDAZOLAM HCL 2 MG/2ML IJ SOLN
INTRAMUSCULAR | Status: AC
  Filled 2016-12-23: qty 2

## 2016-12-23 MED ORDER — SODIUM CHLORIDE 0.9 % IV SOLN
INTRAVENOUS | Status: DC
  Administered 2016-12-23: 14:00:00 via INTRAVENOUS

## 2016-12-23 MED ORDER — MIDAZOLAM HCL 2 MG/2ML IJ SOLN
INTRAMUSCULAR | Status: DC | PRN
  Administered 2016-12-23: 2 mg via INTRAVENOUS

## 2016-12-23 NOTE — Anesthesia Procedure Notes (Signed)
Date/Time: 12/23/2016 2:28 PM Performed by: Johnna Acosta Pre-anesthesia Checklist: Patient identified, Emergency Drugs available, Suction available, Patient being monitored and Timeout performed Patient Re-evaluated:Patient Re-evaluated prior to inductionOxygen Delivery Method: Nasal cannula

## 2016-12-23 NOTE — Op Note (Signed)
[facility]enterology Patient Name: Erin [name] Procedure Date: 12/23/2016 2:26 PM MRN: [id number] Account #: [id number] Date of Birth: March 21, 1951 Admit Type: Outpatient Age: 66 Room: [facility] ENDO ROOM 3 Gender: Female Note Status: Finalized Procedure:            Colonoscopy Indications:          Colon cancer screening in patient at increased risk:                        Family history of 1st-degree relative with colon polyps Providers:            Lollie Sails, MD Referring MD:         Angela Adam. Caryl Bis (Referring MD) Medicines:            Monitored Anesthesia Care Complications:        No immediate complications. Procedure:            Pre-Anesthesia Assessment:                       - ASA Grade Assessment: II - A patient with mild                        systemic disease.                       After obtaining informed consent, the colonoscope was                        passed under direct vision. Throughout the procedure,                        the patient's blood pressure, pulse, and oxygen                        saturations were monitored continuously. The Olympus                        PCF-H180AL colonoscope ( S#: [id number] ) was introduced                        through the anus and advanced to the the cecum,                        identified by appendiceal orifice and ileocecal valve.                        The colonoscopy was unusually difficult due to poor                        bowel prep and significant looping. Successful                        completion of the procedure was aided by changing the                        patient to a supine position, changing the patient to a                        prone position, using manual pressure and lavage. The  patient tolerated the procedure well. The quality of                        the bowel preparation was fair. Findings:      Two flat polyps were found in the proximal ascending  colon. The polyps       were 2 mm in size. These polyps were removed with a cold biopsy forceps.       Resection and retrieval were complete.      The digital rectal exam was normal.      The exam was otherwise without abnormality. Impression:           - Preparation of the colon was fair.                       - Two 2 mm polyps in the proximal ascending colon,                        removed with a cold biopsy forceps. Resected and                        retrieved.                       - The examination was otherwise normal. Recommendation:       - Discharge patient to home.                       - Await pathology results.                       - Telephone GI clinic for pathology results in 1 week. Procedure Code(s):    --- Professional ---                       [id number], Colonoscopy, flexible; with biopsy, single or                        multiple Diagnosis Code(s):    --- Professional ---                       Z83.71, Family history of colonic polyps                       D12.2, Benign neoplasm of ascending colon CPT copyright 2016 American Medical Association. All rights reserved. The codes documented in this report are preliminary and upon coder review may  be revised to meet current compliance requirements. Lollie Sails, MD 12/23/2016 3:16:31 PM This report has been signed electronically. Number of Addenda: 0 Note Initiated On: 12/23/2016 2:26 PM Scope Withdrawal Time: 0 hours 13 minutes 58 seconds  Total Procedure Duration: 0 hours 37 minutes 31 seconds       [facility]

## 2016-12-23 NOTE — Anesthesia Postprocedure Evaluation (Signed)
Anesthesia Post Note  Patient: Erin [name]  Procedure(s) Performed: Procedure(s) (LRB): COLONOSCOPY WITH PROPOFOL (N/A)  Patient location during evaluation: PACU Anesthesia Type: General Level of consciousness: awake and alert and oriented Pain management: pain level controlled Vital Signs Assessment: post-procedure vital signs reviewed and stable Respiratory status: spontaneous breathing Cardiovascular status: blood pressure returned to baseline Anesthetic complications: no     Last Vitals:  Vitals:   12/23/16 1525 12/23/16 1535  BP: 104/67 119/65  Pulse: 73   Resp: 16   Temp:      Last Pain:  Vitals:   12/23/16 1515  TempSrc: Tympanic                 Quaniya Damas

## 2016-12-23 NOTE — H&P (Signed)
Outpatient short stay form Pre-procedure 12/23/2016 2:21 PM Lollie Sails MD  Primary Physician: [facility] family practice  Reason for visit:  Colonoscopy  History of present illness:  Patient is a 66 year old female presenting today as above. She has a family history of colon polyps. She has had a colonoscopy in the past about 10 years ago. She takes no aspirin products or blood thinning agents. She tolerated her prep well.    Current Facility-Administered Medications:  .  0.9 %  sodium chloride infusion, , Intravenous, Continuous, Lollie Sails, MD, Last Rate: 20 mL/hr at 12/23/16 1414 .  0.9 %  sodium chloride infusion, , Intravenous, Continuous, Lollie Sails, MD  Prescriptions Prior to Admission  Medication Sig Dispense Refill Last Dose  . amitriptyline (ELAVIL) 50 MG tablet TAKE 1 TABLET (50 MG TOTAL) BY MOUTH AT BEDTIME. 90 tablet 1 12/22/2016 at Unknown time  . cholecalciferol (VITAMIN D) 1000 UNITS tablet Take 1,000 Units by mouth daily.   Past Week at Unknown time  . Cinnamon 500 MG capsule Take 500 mg by mouth daily.   Past Week at Unknown time  . lansoprazole (PREVACID) 30 MG capsule TAKE 1 CAPSULE (30 MG TOTAL) BY MOUTH DAILY. 90 capsule 3 12/22/2016 at Unknown time  . Multiple Vitamins-Minerals (CENTRUM SILVER PO) Take by mouth.   Past Week at Unknown time  . scopolamine (TRANSDERM-SCOP, 1.5 MG,) 1 MG/3DAYS Place 1 patch (1.5 mg total) onto the skin every 3 (three) days as needed (motion sickness). 10 patch 0 Past Week at Unknown time  . SYNTHROID 100 MCG tablet   3 12/22/2016 at Unknown time  . ALPRAZolam (XANAX) 0.25 MG tablet Take 1 tablet (0.25 mg total) by mouth 2 (two) times daily as needed for anxiety. (Patient not taking: Reported on 12/23/2016) 60 tablet 0 Not Taking at Unknown time     Allergies  Allergen Reactions  . Amoxicillin Hives  . Atorvastatin     Joint pain  . Codeine Nausea Only    weakness     Past Medical History:  Diagnosis Date   . Cancer [facility]) 2011   thyroid, Dr. Carlis Abbott  . Thyroid disease     Review of systems:      Physical Exam    Heart and lungs: Regular rate and rhythm without rub or gallop, lungs are bilaterally clear.    HEENT: Normocephalic atraumatic eyes are anicteric    Other:     Pertinant exam for procedure: Soft nontender nondistended bowel sounds positive normoactive.    Planned proceedures: Colonoscopy and indicated procedures. I have discussed the risks benefits and complications of procedures to include not limited to bleeding, infection, perforation and the risk of sedation and the patient wishes to proceed.    Lollie Sails, MD Gastroenterology 12/23/2016  2:21 PM

## 2016-12-23 NOTE — Anesthesia Post-op Follow-up Note (Cosign Needed)
Anesthesia QCDR form completed.        

## 2016-12-23 NOTE — Anesthesia Preprocedure Evaluation (Signed)
Anesthesia Evaluation  Patient identified by MRN, date of birth, ID band Patient awake    Reviewed: Allergy & Precautions, NPO status , Patient's Chart, lab work & pertinent test results  Airway Mallampati: III  TM Distance: <3 FB     Dental  (+) Caps, Chipped   Pulmonary neg pulmonary ROS,    Pulmonary exam normal        Cardiovascular negative cardio ROS Normal cardiovascular exam     Neuro/Psych Mood disordernegative neurological ROS     GI/Hepatic Neg liver ROS, GERD  ,  Endo/Other  Hypothyroidism   Renal/GU   negative genitourinary   Musculoskeletal negative musculoskeletal ROS (+)   Abdominal Normal abdominal exam  (+)   Peds negative pediatric ROS (+)  Hematology negative hematology ROS (+)   Anesthesia Other Findings   Reproductive/Obstetrics                             Anesthesia Physical Anesthesia Plan  ASA: II  Anesthesia Plan: General   Post-op Pain Management:    Induction: Intravenous  Airway Management Planned: Nasal Cannula  Additional Equipment:   Intra-op Plan:   Post-operative Plan:   Informed Consent: I have reviewed the patients History and Physical, chart, labs and discussed the procedure including the risks, benefits and alternatives for the proposed anesthesia with the patient or authorized representative who has indicated his/her understanding and acceptance.   Dental advisory given  Plan Discussed with: CRNA and Surgeon  Anesthesia Plan Comments:         Anesthesia Quick Evaluation

## 2016-12-23 NOTE — Transfer of Care (Signed)
Immediate Anesthesia Transfer of Care Note  Patient: Cory Rama Kerkman  Procedure(s) Performed: Procedure(s): COLONOSCOPY WITH PROPOFOL (N/A)  Patient Location: PACU  Anesthesia Type:General  Level of Consciousness: sedated  Airway & Oxygen Therapy: Patient Spontanous Breathing and Patient connected to nasal cannula oxygen  Post-op Assessment: Report given to RN and Post -op Vital signs reviewed and stable  Post vital signs: Reviewed and stable  Last Vitals:  Vitals:   12/23/16 1357 12/23/16 1515  BP: (!) 148/84 95/62  Pulse: 87 87  Resp: 20 17  Temp: 36.7 C (!) 35.8 C    Last Pain:  Vitals:   12/23/16 1515  TempSrc: Tympanic         Complications: No apparent anesthesia complications

## 2016-12-25 ENCOUNTER — Encounter: Payer: Self-pay | Admitting: Gastroenterology

## 2016-12-25 LAB — SURGICAL PATHOLOGY

## 2017-01-06 ENCOUNTER — Other Ambulatory Visit: Payer: Self-pay

## 2017-01-06 MED ORDER — LANSOPRAZOLE 30 MG PO CPDR: DELAYED_RELEASE_CAPSULE | ORAL | 3 refills | Status: DC

## 2017-01-06 NOTE — Telephone Encounter (Signed)
Last OV 11/26/16 last filled by Dr.Walker 01/04/2016 90 3rf

## 2017-01-21 DIAGNOSIS — E890 Postprocedural hypothyroidism: Secondary | ICD-10-CM | POA: Diagnosis not present

## 2017-01-21 DIAGNOSIS — E063 Autoimmune thyroiditis: Secondary | ICD-10-CM | POA: Diagnosis not present

## 2017-01-21 DIAGNOSIS — M1991 Primary osteoarthritis, unspecified site: Secondary | ICD-10-CM | POA: Diagnosis not present

## 2017-01-21 DIAGNOSIS — E785 Hyperlipidemia, unspecified: Secondary | ICD-10-CM | POA: Diagnosis not present

## 2017-01-21 DIAGNOSIS — C73 Malignant neoplasm of thyroid gland: Secondary | ICD-10-CM | POA: Diagnosis not present

## 2017-01-21 DIAGNOSIS — K229 Disease of esophagus, unspecified: Secondary | ICD-10-CM | POA: Diagnosis not present

## 2017-01-28 DIAGNOSIS — E063 Autoimmune thyroiditis: Secondary | ICD-10-CM | POA: Diagnosis not present

## 2017-01-28 DIAGNOSIS — E890 Postprocedural hypothyroidism: Secondary | ICD-10-CM | POA: Diagnosis not present

## 2017-01-28 DIAGNOSIS — M1991 Primary osteoarthritis, unspecified site: Secondary | ICD-10-CM | POA: Diagnosis not present

## 2017-01-28 DIAGNOSIS — C73 Malignant neoplasm of thyroid gland: Secondary | ICD-10-CM | POA: Diagnosis not present

## 2017-01-28 DIAGNOSIS — E785 Hyperlipidemia, unspecified: Secondary | ICD-10-CM | POA: Diagnosis not present

## 2017-01-28 DIAGNOSIS — K229 Disease of esophagus, unspecified: Secondary | ICD-10-CM | POA: Diagnosis not present

## 2017-01-30 ENCOUNTER — Encounter: Payer: Self-pay | Admitting: Family Medicine

## 2017-02-13 DIAGNOSIS — G43109 Migraine with aura, not intractable, without status migrainosus: Secondary | ICD-10-CM | POA: Diagnosis not present

## 2017-04-15 ENCOUNTER — Other Ambulatory Visit: Payer: Self-pay

## 2017-04-15 MED ORDER — LANSOPRAZOLE 30 MG PO CPDR: DELAYED_RELEASE_CAPSULE | ORAL | 3 refills | Status: DC

## 2017-05-13 ENCOUNTER — Other Ambulatory Visit: Payer: Self-pay | Admitting: Family Medicine

## 2017-05-13 DIAGNOSIS — Z1231 Encounter for screening mammogram for malignant neoplasm of breast: Secondary | ICD-10-CM

## 2017-05-28 ENCOUNTER — Ambulatory Visit (INDEPENDENT_AMBULATORY_CARE_PROVIDER_SITE_OTHER): Payer: Medicare HMO | Admitting: Family Medicine

## 2017-05-28 ENCOUNTER — Encounter: Payer: Self-pay | Admitting: Family Medicine

## 2017-05-28 DIAGNOSIS — Z124 Encounter for screening for malignant neoplasm of cervix: Secondary | ICD-10-CM

## 2017-05-28 DIAGNOSIS — F4323 Adjustment disorder with mixed anxiety and depressed mood: Secondary | ICD-10-CM

## 2017-05-28 DIAGNOSIS — Z23 Encounter for immunization: Secondary | ICD-10-CM | POA: Diagnosis not present

## 2017-05-28 DIAGNOSIS — K219 Gastro-esophageal reflux disease without esophagitis: Secondary | ICD-10-CM

## 2017-05-28 DIAGNOSIS — D126 Benign neoplasm of colon, unspecified: Secondary | ICD-10-CM | POA: Diagnosis not present

## 2017-05-28 DIAGNOSIS — E039 Hypothyroidism, unspecified: Secondary | ICD-10-CM | POA: Diagnosis not present

## 2017-05-28 DIAGNOSIS — M8580 Other specified disorders of bone density and structure, unspecified site: Secondary | ICD-10-CM

## 2017-05-28 DIAGNOSIS — K635 Polyp of colon: Secondary | ICD-10-CM | POA: Insufficient documentation

## 2017-05-28 DIAGNOSIS — R69 Illness, unspecified: Secondary | ICD-10-CM | POA: Diagnosis not present

## 2017-05-28 NOTE — Assessment & Plan Note (Signed)
Well-controlled.  Continue Synthroid. 

## 2017-05-28 NOTE — Assessment & Plan Note (Addendum)
Due in December of this year. She'll contact her gynecologist.

## 2017-05-28 NOTE — Assessment & Plan Note (Signed)
No anxiety or depression at this time. She'll continue amitriptyline. Continue to monitor.

## 2017-05-28 NOTE — Assessment & Plan Note (Signed)
DEXA scan reviewed with patient. Osteopenic. Discussed frax score. Total fracture risk 20.1% barely placing her into the risk category. Discussed option of starting a medication versus maintaining activity and taking 1200 mg of calcium supplementation and 1000 international units of vitamin D supplementation. Patient does report a history of stomach ulcer in the past which would make oral bisphosphonates not a great option. Discussed Prolia. Discussed benefit of building up her bones and decreased risk of fracture though also advised that there were risks with this medication. Patient and I are hesitant to start on medication at this time based on minimally elevated total fracture risk percentage. If she changes her mind she will contact us.

## 2017-05-28 NOTE — Progress Notes (Signed)
  [name], MD Phone: [phone]  Erin [name] is a 66 y.o. female who presents today for follow-up.  Hypothyroidism: Taking Synthroid. No skin changes, heat or cold intolerance, or weight changes.  GERD: Taking Prevacid. No reflux. No abdominal pain. No blood in her stool.  She denies any depression or anxiety. She is taking amitriptyline.  Colonoscopy recently. Recall for 5 years. Pap smear results from gynecology received. Discussed the patient would be due this December. She'll contact her gynecologist.  She is taking calcium and vitamin D for osteopenia.  PMH: nonsmoker.   ROS see history of present illness  Objective  Physical Exam Vitals:   05/28/17 0801  BP: 110/82  Pulse: 83  Temp: 98.3 F (36.8 C)  SpO2: 98%    BP Readings from Last 3 Encounters:  05/28/17 110/82  12/23/16 119/65  11/26/16 100/80   Wt Readings from Last 3 Encounters:  05/28/17 139 lb 3.2 oz (63.1 kg)  12/23/16 138 lb (62.6 kg)  11/26/16 138 lb 3.2 oz (62.7 kg)    Physical Exam  Constitutional: No distress.  Neck: No thyromegaly present.  Cardiovascular: Normal rate, regular rhythm and normal heart sounds.   Pulmonary/Chest: Effort normal and breath sounds normal.  Neurological: She is alert. Gait normal.  Skin: She is not diaphoretic.     Assessment/Plan: Please see individual problem list.  Adjustment disorder with mixed anxiety and depressed mood No anxiety or depression at this time. She'll continue amitriptyline. Continue to monitor.  GERD (gastroesophageal reflux disease) Well-controlled. Continue Prevacid.  Hypothyroidism Well-controlled. Continue Synthroid.  Cervical cancer screening Due in December of this year. She'll contact her gynecologist.  Colon polyps 5 year recall for colonoscopy.  Osteopenia DEXA scan reviewed with patient. Osteopenic. Discussed frax score. Total fracture risk 20.1% barely placing her into the risk category. Discussed option  of starting a medication versus maintaining activity and taking 1200 mg of calcium supplementation and 1000 international units of vitamin D supplementation. Patient does report a history of stomach ulcer in the past which would make oral bisphosphonates not a great option. Discussed Prolia. Discussed benefit of building up her bones and decreased risk of fracture though also advised that there were risks with this medication. Patient and I are hesitant to start on medication at this time based on minimally elevated total fracture risk percentage. If she changes her mind she will contact us.   Orders Placed This Encounter  Procedures  . Flu vaccine HIGH DOSE PF    No orders of the defined types were placed in this encounter.  Health maintenance: Patient deferred tetanus vaccination, hepatitis C screening, and Prevnar. Flu shot given.  [name], MD [place]

## 2017-05-28 NOTE — Patient Instructions (Signed)
Nice to see you. Please continue current medications. I'll see you back in 6 months.

## 2017-05-28 NOTE — Assessment & Plan Note (Signed)
Well-controlled. Continue Prevacid.

## 2017-05-28 NOTE — Assessment & Plan Note (Signed)
5 year recall for colonoscopy.

## 2017-06-10 ENCOUNTER — Ambulatory Visit
Admission: RE | Admit: 2017-06-10 | Discharge: 2017-06-10 | Disposition: A | Discharge status: Home / Self Care | Payer: Medicare HMO | Source: Ambulatory Visit | Attending: Family Medicine | Admitting: Family Medicine

## 2017-06-10 DIAGNOSIS — Z1231 Encounter for screening mammogram for malignant neoplasm of breast: Secondary | ICD-10-CM | POA: Diagnosis not present

## 2017-06-15 ENCOUNTER — Telehealth: Payer: Self-pay | Admitting: Family Medicine

## 2017-06-15 MED ORDER — AMITRIPTYLINE HCL 50 MG PO TABS: ORAL_TABLET | ORAL | 1 refills | Status: DC

## 2017-06-15 NOTE — Telephone Encounter (Unsigned)
Copied from [place]. Topic: Inquiry >> Jun 15, 2017 10:40 AM Malena Catholic I, NT wrote: Reason for CRM:pt call for RX Elavil 50 Mg

## 2017-06-15 NOTE — Telephone Encounter (Signed)
Refill sent as requested. 

## 2017-06-15 NOTE — Telephone Encounter (Signed)
Pt call for Rx on Elavil 50 MG Pt use [facility] in [place] in [address]

## 2017-07-20 DIAGNOSIS — R69 Illness, unspecified: Secondary | ICD-10-CM | POA: Diagnosis not present

## 2017-09-01 DIAGNOSIS — D2261 Melanocytic nevi of right upper limb, including shoulder: Secondary | ICD-10-CM | POA: Diagnosis not present

## 2017-09-01 DIAGNOSIS — X32XXXA Exposure to sunlight, initial encounter: Secondary | ICD-10-CM | POA: Diagnosis not present

## 2017-09-01 DIAGNOSIS — D2272 Melanocytic nevi of left lower limb, including hip: Secondary | ICD-10-CM | POA: Diagnosis not present

## 2017-09-01 DIAGNOSIS — D225 Melanocytic nevi of trunk: Secondary | ICD-10-CM | POA: Diagnosis not present

## 2017-09-01 DIAGNOSIS — Z85828 Personal history of other malignant neoplasm of skin: Secondary | ICD-10-CM | POA: Diagnosis not present

## 2017-09-01 DIAGNOSIS — D485 Neoplasm of uncertain behavior of skin: Secondary | ICD-10-CM | POA: Diagnosis not present

## 2017-09-01 DIAGNOSIS — L570 Actinic keratosis: Secondary | ICD-10-CM | POA: Diagnosis not present

## 2017-09-01 DIAGNOSIS — L821 Other seborrheic keratosis: Secondary | ICD-10-CM | POA: Diagnosis not present

## 2017-09-08 ENCOUNTER — Other Ambulatory Visit: Payer: Self-pay

## 2017-09-08 MED ORDER — AMITRIPTYLINE HCL 50 MG PO TABS: ORAL_TABLET | ORAL | 1 refills | Status: DC

## 2017-10-28 ENCOUNTER — Ambulatory Visit: Payer: Self-pay

## 2017-10-28 ENCOUNTER — Other Ambulatory Visit: Payer: Self-pay

## 2017-10-28 ENCOUNTER — Encounter: Payer: Self-pay | Admitting: *Deleted

## 2017-10-28 ENCOUNTER — Ambulatory Visit
Admission: EM | Admit: 2017-10-28 | Discharge: 2017-10-28 | Disposition: A | Discharge status: Home / Self Care | Payer: Medicare HMO | Attending: Family Medicine | Admitting: Family Medicine

## 2017-10-28 DIAGNOSIS — E039 Hypothyroidism, unspecified: Secondary | ICD-10-CM | POA: Insufficient documentation

## 2017-10-28 DIAGNOSIS — Z823 Family history of stroke: Secondary | ICD-10-CM | POA: Insufficient documentation

## 2017-10-28 DIAGNOSIS — Z79899 Other long term (current) drug therapy: Secondary | ICD-10-CM | POA: Insufficient documentation

## 2017-10-28 DIAGNOSIS — R0789 Other chest pain: Secondary | ICD-10-CM | POA: Diagnosis not present

## 2017-10-28 DIAGNOSIS — R69 Illness, unspecified: Secondary | ICD-10-CM | POA: Diagnosis not present

## 2017-10-28 DIAGNOSIS — Z8249 Family history of ischemic heart disease and other diseases of the circulatory system: Secondary | ICD-10-CM | POA: Insufficient documentation

## 2017-10-28 DIAGNOSIS — R030 Elevated blood-pressure reading, without diagnosis of hypertension: Secondary | ICD-10-CM | POA: Insufficient documentation

## 2017-10-28 DIAGNOSIS — K219 Gastro-esophageal reflux disease without esophagitis: Secondary | ICD-10-CM | POA: Insufficient documentation

## 2017-10-28 DIAGNOSIS — Z885 Allergy status to narcotic agent status: Secondary | ICD-10-CM | POA: Insufficient documentation

## 2017-10-28 DIAGNOSIS — Z880 Allergy status to penicillin: Secondary | ICD-10-CM | POA: Diagnosis not present

## 2017-10-28 DIAGNOSIS — Z800 Family history of malignant neoplasm of digestive organs: Secondary | ICD-10-CM | POA: Insufficient documentation

## 2017-10-28 DIAGNOSIS — Z9049 Acquired absence of other specified parts of digestive tract: Secondary | ICD-10-CM | POA: Diagnosis not present

## 2017-10-28 DIAGNOSIS — Z888 Allergy status to other drugs, medicaments and biological substances status: Secondary | ICD-10-CM | POA: Diagnosis not present

## 2017-10-28 DIAGNOSIS — R079 Chest pain, unspecified: Secondary | ICD-10-CM | POA: Diagnosis not present

## 2017-10-28 DIAGNOSIS — E785 Hyperlipidemia, unspecified: Secondary | ICD-10-CM | POA: Diagnosis not present

## 2017-10-28 DIAGNOSIS — F419 Anxiety disorder, unspecified: Secondary | ICD-10-CM | POA: Insufficient documentation

## 2017-10-28 DIAGNOSIS — Z9889 Other specified postprocedural states: Secondary | ICD-10-CM | POA: Insufficient documentation

## 2017-10-28 NOTE — Telephone Encounter (Signed)
Also if she has had chest tightness today she should be evaluated at urgent care or the emergency department.

## 2017-10-28 NOTE — Discharge Instructions (Signed)
Please follow up with your PCP.  Everything looks good here.  Take care  Dr. Lacinda Axon

## 2017-10-28 NOTE — Telephone Encounter (Signed)
Patient states she has had chest tightness today, per Dr.Sonnenberg I informed patient to go to urgent care or ER. Patient states she will go to urgent care today.

## 2017-10-28 NOTE — ED Triage Notes (Addendum)
PAtient started having symptom of center chest tightness and hypertension this AM. No chest pain reported but possibly having anxiety.

## 2017-10-28 NOTE — Telephone Encounter (Signed)
fyi

## 2017-10-28 NOTE — Telephone Encounter (Signed)
Patient is at urgent care

## 2017-10-28 NOTE — ED Provider Notes (Signed)
[facility]    CSN: [id number] Arrival date & time: 10/28/17  1406  History   Chief Complaint Chief Complaint  Patient presents with  . Anxiety   HPI  67 year old female with hyperlipidemia, GERD, hypothyroidism presents with concerns about elevated blood pressure.  Patient states that she took her blood pressure this morning and it was elevated.  She states that she just felt like she should take her blood pressure.  She states that she was feeling fine prior to taking her blood pressure.  She states that her blood pressure has been elevated in the 160s/100s.  She has taken it several times.  It has been trending down.  She is now anxious regarding this.  She reports that she has had some intermittent chest tightness over the past few days.  No diaphoresis.  No nausea.  No vomiting.  No shortness of breath.  No radiation.  She states that she is currently asymptomatic.  She is anxious.  No known inciting factor.  No known exacerbating relieving factors.  No other complaints at this time.  Past Medical History:  Diagnosis Date  . Cancer [facility]) 2011   thyroid, Dr. Carlis Abbott  . Thyroid disease    Patient Active Problem List   Diagnosis Date Noted  . Colon polyps 05/28/2017  . Neck pain on right side 07/09/2016  . Adjustment disorder with mixed anxiety and depressed mood 11/26/2015  . Right hip pain 10/24/2013  . Arthralgia 08/11/2013  . Osteopenia 06/08/2013  . Cervical cancer screening 04/25/2013  . Routine general medical examination at a health care facility 10/20/2012  . GERD (gastroesophageal reflux disease) 04/19/2012  . Hyperlipidemia LDL goal < 100 04/19/2012  . Hypothyroidism 04/19/2012   Past Surgical History:  Procedure Laterality Date  . CHOLECYSTECTOMY    . COLONOSCOPY WITH PROPOFOL N/A 12/23/2016   Procedure: COLONOSCOPY WITH PROPOFOL;  Surgeon: Lollie Sails, MD;  Location: [facility] ENDOSCOPY;  Service: Endoscopy;  Laterality: N/A;  . GALLBLADDER SURGERY   2000  . KNEE SURGERY  2003/ 2010   right knee x2 Dr. Marry Guan for torn meniscus  . [place]   right, arthritis  . THYROIDECTOMY  2011   right side    OB History   None    Home Medications    Prior to Admission medications   Medication Sig Start Date End Date Taking? Authorizing Provider  amitriptyline (ELAVIL) 50 MG tablet TAKE 1 TABLET (50 MG TOTAL) BY MOUTH AT BEDTIME. 09/08/17  Yes Leone Haven, MD  cholecalciferol (VITAMIN D) 1000 UNITS tablet Take 1,000 Units by mouth daily.   Yes [provider]  Cinnamon 500 MG capsule Take 500 mg by mouth daily.   Yes [provider]  lansoprazole (PREVACID) 30 MG capsule TAKE 1 CAPSULE (30 MG TOTAL) BY MOUTH DAILY. 04/15/17  Yes Leone Haven, MD  Multiple Vitamins-Minerals (CENTRUM SILVER PO) Take by mouth.   Yes [provider]  Polyethylene Glycol 3350 (PEG 3350) POWD Take as directed for colonic prep. 09/25/16  Yes [provider]  SYNTHROID 100 MCG tablet  10/31/15  Yes [provider]  scopolamine (TRANSDERM-SCOP, 1.5 MG,) 1 MG/3DAYS Place 1 patch (1.5 mg total) onto the skin every 3 (three) days as needed (motion sickness). 09/30/16   Leone Haven, MD    Family History Family History  Problem Relation Age of Onset  . Stroke Mother   . Heart disease Father   . Cancer Maternal Grandmother  pancreatic    Social History Social History   Tobacco Use  . Smoking status: Never Smoker  . Smokeless tobacco: Never Used  Substance Use Topics  . Alcohol use: Yes    Alcohol/week: 0.0 oz    Comment: socially  . Drug use: No     Allergies   Amoxicillin; Atorvastatin; and Codeine   Review of Systems Review of Systems  Constitutional: Negative.   Respiratory: Positive for chest tightness.   Psychiatric/Behavioral: The patient is nervous/anxious.    Physical Exam Triage Vital Signs ED Triage Vitals  Enc Vitals Group     BP 10/28/17 1412 138/80      Pulse Rate 10/28/17 1412 (!) 104     Resp 10/28/17 1412 18     Temp 10/28/17 1412 98.6 F (37 C)     Temp Source 10/28/17 1412 Oral     SpO2 10/28/17 1412 100 %     Weight 10/28/17 1414 133 lb (60.3 kg)     Height 10/28/17 1414 5\' 3"  (1.6 m)     Head Circumference --      Peak Flow --      Pain Score 10/28/17 1413 0     Pain Loc --      Pain Edu? --      Excl. in [place]? --    Updated Vital Signs BP 138/80 (BP Location: Left Arm)   Pulse (!) 104   Temp 98.6 F (37 C) (Oral)   Resp 18   Ht 5\' 3"  (1.6 m)   Wt 133 lb (60.3 kg)   SpO2 100%   BMI 23.56 kg/m   Physical Exam  Constitutional: She is oriented to person, place, and time. She appears well-developed. No distress.  HENT:  Head: Normocephalic and atraumatic.  Nose: Nose normal.  Eyes: Conjunctivae are normal. Right eye exhibits no discharge. Left eye exhibits no discharge.  Cardiovascular: Normal rate and regular rhythm.  No murmur heard. Pulmonary/Chest: Effort normal and breath sounds normal. She has no wheezes. She has no rales.  Neurological: She is alert and oriented to person, place, and time.  Psychiatric:  Anxious, tearful.  Nursing note and vitals reviewed.  UC Treatments / Results  Labs (all labs ordered are listed, but only abnormal results are displayed) Labs Reviewed - No data to display  EKG  Date: 10/28/2017  EKG Time: 2:54 PM  Rate: 92  Rhythm: normal sinus rhythm  Axis: Normal.  Intervals:Normal intervals.  ST&T Change: No ST or T wave changes..  Narrative Interpretation: Normal sinus rhythm with rate 92.  Normal intervals.  Normal axis.  No ST-T wave changes.  Normal EKG.  Radiology No results found.  Procedures Procedures (including critical care time)  Medications Ordered in UC Medications - No data to display   Initial Impression / Assessment and Plan / UC Course  I have reviewed the triage vital signs and the nursing notes.  Pertinent labs & imaging results that were available  during my care of the patient were reviewed by me and considered in my medical decision making (see chart for details).    67 year old female presents with elevated BP and chest tightness.  Her blood pressure is quite well controlled here, 138/80.  EKG was obtained given history.  EKG normal.  Patient is very well-appearing.  Advised supportive care. Follow up with PCP.  Final Clinical Impressions(s) / UC Diagnoses   Final diagnoses:  Elevated BP without diagnosis of hypertension    ED Discharge Orders  None     Controlled Substance Prescriptions [place] Controlled Substance Registry consulted? Not Applicable   Coral Spikes, DO 10/28/17 1455

## 2017-10-28 NOTE — Telephone Encounter (Signed)
Noted. Please follow-up with her tomorrow to ensure she got evaluated and try to get her scheduled for follow-up next week. Thanks.

## 2017-10-28 NOTE — Telephone Encounter (Signed)
She certainly needs to be evaluated at some point regardless of if her blood pressure comes down tomorrow given the intermittent chest tightness.  We could try to see her later this week or early next week.  If she has recurrent chest tightness she needs to go get evaluated at urgent care or the emergency department sooner.

## 2017-10-28 NOTE — Telephone Encounter (Signed)
Pt. Called to report she checked her blood pressure today and got elevated reading X 2  - 160/100. Denies any headache. Spoke with Alwyn Ren in office . Recommends keeping an eye on BP today and in morning. If still elevated, will schedule an appointment. At the end of call, pt. Reports "sometimes my chest feels tight." Instructed if chest tightness returns, or she develops headache or vision changes, to go to UC. Verbalizes understanding.  Reason for Disposition . Systolic BP  >= 103 OR Diastolic >= 013  Answer Assessment - Initial Assessment Questions 1. BLOOD PRESSURE: "What is the blood pressure?" "Did you take at least two measurements 5 minutes apart?"     160/100 at 10:00      160/100  At 11:45 2. ONSET: "When did you take your blood pressure?"     10:00 3. HOW: "How did you obtain the blood pressure?" (e.g., visiting nurse, automatic home BP monitor)     Home BP monitor 4. HISTORY: "Do you have a history of high blood pressure?"     No 5. MEDICATIONS: "Are you taking any medications for blood pressure?" "Have you missed any doses recently?"     No meds 6. OTHER SYMPTOMS: "Do you have any symptoms?" (e.g., headache, chest pain, blurred vision, difficulty breathing, weakness)      7. PREGNANCY: "Is there any chance you are pregnant?" "When was your last menstrual period?"     No  Protocols used: HIGH BLOOD PRESSURE-A-AH

## 2017-10-29 NOTE — Telephone Encounter (Signed)
Patient states she is feeling better after going to urgent care. I have scheduled patient for a follow up next Tuesday,

## 2017-10-29 NOTE — Telephone Encounter (Signed)
Noted  

## 2017-11-03 ENCOUNTER — Encounter: Payer: Self-pay | Admitting: Family Medicine

## 2017-11-03 ENCOUNTER — Ambulatory Visit (INDEPENDENT_AMBULATORY_CARE_PROVIDER_SITE_OTHER): Payer: Medicare HMO | Admitting: Family Medicine

## 2017-11-03 VITALS — BP 118/80 | HR 85 | Temp 98.3°F | Ht 63.0 in | Wt 135.4 lb

## 2017-11-03 DIAGNOSIS — R69 Illness, unspecified: Secondary | ICD-10-CM | POA: Diagnosis not present

## 2017-11-03 DIAGNOSIS — F4323 Adjustment disorder with mixed anxiety and depressed mood: Secondary | ICD-10-CM

## 2017-11-03 DIAGNOSIS — R079 Chest pain, unspecified: Secondary | ICD-10-CM | POA: Insufficient documentation

## 2017-11-03 DIAGNOSIS — K219 Gastro-esophageal reflux disease without esophagitis: Secondary | ICD-10-CM | POA: Diagnosis not present

## 2017-11-03 DIAGNOSIS — R0789 Other chest pain: Secondary | ICD-10-CM | POA: Diagnosis not present

## 2017-11-03 DIAGNOSIS — E039 Hypothyroidism, unspecified: Secondary | ICD-10-CM | POA: Diagnosis not present

## 2017-11-03 LAB — COMPREHENSIVE METABOLIC PANEL
ALT: 15 U/L (ref 0–35)
AST: 20 U/L (ref 0–37)
Albumin: 3.9 g/dL (ref 3.5–5.2)
Alkaline Phosphatase: 59 U/L (ref 39–117)
BUN: 16 mg/dL (ref 6–23)
CO2: 31 meq/L (ref 19–32)
Calcium: 9.3 mg/dL (ref 8.4–10.5)
Chloride: 105 mEq/L (ref 96–112)
Creatinine, Ser: 0.67 mg/dL (ref 0.40–1.20)
GFR: 93.72 mL/min (ref >60.00)
GLUCOSE: 97 mg/dL (ref 70–99)
POTASSIUM: 4.4 meq/L (ref 3.5–5.1)
SODIUM: 141 meq/L (ref 135–145)
TOTAL PROTEIN: 6.7 g/dL (ref 6.0–8.3)
Total Bilirubin: 0.4 mg/dL (ref 0.2–1.2)

## 2017-11-03 LAB — TSH: TSH: 0.1 u[IU]/mL — AB (ref 0.35–4.50)

## 2017-11-03 LAB — CBC
HCT: 42.1 % (ref 36.0–46.0)
HEMOGLOBIN: 14.2 g/dL (ref 12.0–15.0)
MCHC: 33.7 g/dL (ref 30.0–36.0)
MCV: 91.2 fl (ref 78.0–100.0)
Platelets: 267 10*3/uL (ref 150.0–400.0)
RBC: 4.62 Mil/uL (ref 3.87–5.11)
RDW: 13.6 % (ref 11.5–15.5)
WBC: 7.6 10*3/uL (ref 4.0–10.5)

## 2017-11-03 MED ORDER — OMEPRAZOLE 20 MG PO CPDR: 20.0000 mg | DELAYED_RELEASE_CAPSULE | Freq: Every day | ORAL | 3 refills | Status: DC

## 2017-11-03 NOTE — Patient Instructions (Signed)
Nice to see you.  We will check labs today.  We will change you to Prilosec for your reflux. Please monitor your anxiety and if this worsens please let us know.  We could consider therapy or a daily medication. We will get you referred to cardiology.  If you develop chest pain that persists or trouble breathing please seek medical attention immediately. Please contact your gynecologist for your annual gynecologic exam.

## 2017-11-03 NOTE — Progress Notes (Signed)
[name], MD Phone: [phone]  Erin [name] is a 67 y.o. female who presents today for follow-up.  Elevated blood pressure: Seen at urgent care for blood pressure 160/100.  This had trended down to the near normal range when she was seen.  She notes that it had been high on several occasions at home.  She had been having some tightness in her chest that felt like a spasm. Central. This would last less than a minute.  Had been occurring occasionally for some time prior to her blood pressure going up.  Oftentimes before she ate.  No exertional component.  She exercises every day with no issues.  No radiation.  No diaphoresis.  No shortness of breath.  No edema.  EKG was reassuring at urgent care.  No cold medications.  Occasionally takes Aleve though not consistently.  Her father did have an MI at 48.  GERD: Taking Prevacid for this.  Occasionally she has been burping recently.  Occasional reflux symptoms.  No blood in her stool or abdominal pain.  The Prevacid is quite expensive.  Some anxiety after her partner passed away last year.  She notes most the time she is fine though other times she will develop some anxiety when she starts thinking about her partner.  She does not want anything that can be addictive.  She notes no depression.  HYPOTHYROIDISM Disease Monitoring Weight changes: no  Skin Changes: no Heat/Cold intolerance: no  Medication Monitoring Compliance:  Taking synthroid   Last TSH:   Lab Results  Component Value Date   TSH 0.92 11/19/2016     Social History   Tobacco Use  Smoking Status Never Smoker  Smokeless Tobacco Never Used     ROS see history of present illness  Objective  Physical Exam Vitals:   11/03/17 0804  BP: 118/80  Pulse: 85  Temp: 98.3 F (36.8 C)  SpO2: 96%    BP Readings from Last 3 Encounters:  11/03/17 118/80  10/28/17 138/80  05/28/17 110/82   Wt Readings from Last 3 Encounters:  11/03/17 135 lb 6.4 oz (61.4 kg)    10/28/17 133 lb (60.3 kg)  05/28/17 139 lb 3.2 oz (63.1 kg)    Physical Exam  Constitutional: No distress.  Cardiovascular: Normal rate, regular rhythm and normal heart sounds.  Pulmonary/Chest: Effort normal and breath sounds normal.  Abdominal: Soft. Bowel sounds are normal. She exhibits no distension. There is no tenderness. There is no rebound and no guarding.  Neurological: She is alert. Gait normal.  Skin: Skin is warm and dry. She is not diaphoretic.     Assessment/Plan: Please see individual problem list.  GERD (gastroesophageal reflux disease) Suboptimal control and medication is expensive.  We will switch to omeprazole.  Atypical chest pain Fairly atypical.  Could be reflux or anxiety related though I did discuss that women sometimes present atypically with regards to cardiac issues and given her father's history we would refer to cardiology for further evaluation.  Check lab work as well.  Given return precautions.  Hypothyroidism Check TSH.  Adjustment disorder with mixed anxiety and depressed mood Patient with anxiety at times though no depression.  Discussed options for treatment including benzodiazepines, SSRIs, and therapy.  Patient opted to defer these at this time.  We will revisit this at the time of her physical later this month.   Health Maintenance: She will contact her gynecologist to schedule her Pap smear.  Orders Placed This Encounter  Procedures  . TSH  .  CBC  . Comp Met (CMET)  . Ambulatory referral to Cardiology    Referral Priority:   Routine    Referral Type:   Consultation    Referral Reason:   Specialty Services Required    Requested Specialty:   Cardiology    Number of Visits Requested:   1    Meds ordered this encounter  Medications  . omeprazole (PRILOSEC) 20 MG capsule    Sig: Take 1 capsule (20 mg total) by mouth daily.    Dispense:  30 capsule    Refill:  [place], MD [place]

## 2017-11-03 NOTE — Assessment & Plan Note (Signed)
Suboptimal control and medication is expensive.  We will switch to omeprazole.

## 2017-11-03 NOTE — Assessment & Plan Note (Signed)
Patient with anxiety at times though no depression.  Discussed options for treatment including benzodiazepines, SSRIs, and therapy.  Patient opted to defer these at this time.  We will revisit this at the time of her physical later this month.

## 2017-11-03 NOTE — Assessment & Plan Note (Signed)
Check TSH 

## 2017-11-03 NOTE — Assessment & Plan Note (Addendum)
Fairly atypical.  Could be reflux or anxiety related though I did discuss that women sometimes present atypically with regards to cardiac issues and given her father's history we would refer to cardiology for further evaluation.  Check lab work as well.  Given return precautions.

## 2017-11-04 ENCOUNTER — Encounter: Payer: Self-pay | Admitting: Family Medicine

## 2017-11-05 ENCOUNTER — Ambulatory Visit: Payer: Self-pay | Admitting: *Deleted

## 2017-11-05 ENCOUNTER — Telehealth: Payer: Self-pay | Admitting: Family Medicine

## 2017-11-05 ENCOUNTER — Ambulatory Visit (INDEPENDENT_AMBULATORY_CARE_PROVIDER_SITE_OTHER): Payer: Medicare HMO | Admitting: Family Medicine

## 2017-11-05 ENCOUNTER — Encounter: Payer: Self-pay | Admitting: Family Medicine

## 2017-11-05 VITALS — BP 118/84 | HR 88 | Temp 98.7°F | Resp 16 | Wt 134.0 lb

## 2017-11-05 DIAGNOSIS — E039 Hypothyroidism, unspecified: Secondary | ICD-10-CM | POA: Diagnosis not present

## 2017-11-05 DIAGNOSIS — R0789 Other chest pain: Secondary | ICD-10-CM | POA: Diagnosis not present

## 2017-11-05 MED ORDER — LEVOTHYROXINE SODIUM 88 MCG PO TABS: 88.0000 ug | ORAL_TABLET | Freq: Every day | ORAL | 3 refills | Status: DC

## 2017-11-05 NOTE — Patient Instructions (Addendum)
It was a pleasure to meet you today. Please continue omeprazole as prescribed by Dr. Biagio Quint. Your thyroid medication will be decreased and sent to your pharmacy.  Also, please follow up with Dr. Biagio Quint as scheduled later this month and you will also follow up for a recheck of your thyroid lab in approximately 6 weeks.  Follow up with cardiology as scheduled. If you develop chest pain that persists or you have shortness of breath, please seek medical attention immediately.

## 2017-11-05 NOTE — Telephone Encounter (Signed)
[facility] called in , they are currently triaging patient the complaint is heaviness in her chest hat has been going on for a couple of days ,says its not pain just a heaviness advised triage nurse PCP would want patient evaluated , patient ask if this could becoming from her low TSH level?

## 2017-11-05 NOTE — Progress Notes (Signed)
Subjective:    Patient ID: Erin [name], female    DOB: [birth year], 67 y.o.   MRN: [id number]  HPI  Erin [name] is a 67 year old female who presents today with "heaviness or tightness in her chest" that has been present but unchanged since last evaluation on 11/03/17.  She denies chest pain today. She reports feeling anxious which is not new to her. TSH on 11/03/17 was low and her PCP contacted her regarding his recommendation of lowering her dose of Synthroid as this may also be contributing to an elevation of her BP that was noted previously.  She has taken her original dose of 100 mcg today.  Intermittent chest heaviness/tightness  that occurs occasionally but is not noted as pain. She reports associated anxiety and sleep changes that have been present intermittently. This is not associated with exertion and she denies radiation of pain, jaw pain, arm pain, SOB, or diaphoresis.  History of father with MI at age 45.    Symptoms History of Trauma/lifting: No  Nausea/vomiting: No  Diaphoresis: No  Shortness of breath: No  Pleuritic: No  Cough: No  Edema: No  Orthopnea: No  PND: No  Dizziness: No Palpitations: No  Syncope: No  Indigestion: No changes, no appetite change   History of GERD, Hypothyroidism, atypical chest pain, and hyperlipidemia.  She was evaluated 2 days ago for a follow up of an elevated blood pressure reading at urgent care of 160/100. BP trended down to near normal range at her visit. She endorsed some tightness in her chest that lasted less than one minute and felt like a spasm. She thought this happened before eating on an intermittent basis.  Regular exercise without cardiopulmonary symptoms noted.  She  denied chest pain, palpitations, SOB, numbness, tingling, weakness, headaches, or edema. EKG at urgent care was reassuring on 10/28/17 per patient She does not take OTC decongestants.  Past Medical History:  Diagnosis Date  . Cancer [facility]) 2011   thyroid, Dr.  Carlis Abbott  . Thyroid disease      Social History   Socioeconomic History  . Marital status: Single    Spouse name: Not on file  . Number of children: Not on file  . Years of education: Not on file  . Highest education level: Not on file  Occupational History  . Not on file  Social Needs  . Financial resource strain: Not on file  . Food insecurity:    Worry: Not on file    Inability: Not on file  . Transportation needs:    Medical: Not on file    Non-medical: Not on file  Tobacco Use  . Smoking status: Never Smoker  . Smokeless tobacco: Never Used  Substance and Sexual Activity  . Alcohol use: Yes    Alcohol/week: 0.0 oz    Comment: socially  . Drug use: No  . Sexual activity: Never    Partners: Female  Lifestyle  . Physical activity:    Days per week: Not on file    Minutes per session: Not on file  . Stress: Not on file  Relationships  . Social connections:    Talks on phone: Not on file    Gets together: Not on file    Attends religious service: Not on file    Active member of club or organization: Not on file    Attends meetings of clubs or organizations: Not on file    Relationship status: Not on file  . Intimate  partner violence:    Fear of current or ex partner: Not on file    Emotionally abused: Not on file    Physically abused: Not on file    Forced sexual activity: Not on file  Other Topics Concern  . Not on file  Social History Narrative   Lives in [place].      Work - [employer]    Past Surgical History:  Procedure Laterality Date  . CHOLECYSTECTOMY    . COLONOSCOPY WITH PROPOFOL N/A 12/23/2016   Procedure: COLONOSCOPY WITH PROPOFOL;  Surgeon: Lollie Sails, MD;  Location: [facility] ENDOSCOPY;  Service: Endoscopy;  Laterality: N/A;  . GALLBLADDER SURGERY  2000  . KNEE SURGERY  2003/ 2010   right knee x2 Dr. Marry Guan for torn meniscus  . [place]   right, arthritis  . THYROIDECTOMY  2011   right side    Family History  Problem  Relation Age of Onset  . Stroke Mother   . Heart disease Father   . Cancer Maternal Grandmother        pancreatic    Allergies  Allergen Reactions  . Amoxicillin Hives  . Atorvastatin     Joint pain Other reaction(s): Muscle Pain Joint pain  . Codeine Nausea Only    weakness weakness    Current Outpatient Medications on File Prior to Visit  Medication Sig Dispense Refill  . amitriptyline (ELAVIL) 50 MG tablet TAKE 1 TABLET (50 MG TOTAL) BY MOUTH AT BEDTIME. 90 tablet 1  . cholecalciferol (VITAMIN D) 1000 UNITS tablet Take 1,000 Units by mouth daily.    . Cinnamon 500 MG capsule Take 500 mg by mouth daily.    . Multiple Vitamins-Minerals (CENTRUM SILVER PO) Take by mouth.    . lansoprazole (PREVACID) 30 MG capsule TAKE 1 CAPSULE (30 MG TOTAL) BY MOUTH DAILY. (Patient not taking: Reported on 11/05/2017) 90 capsule 3  . omeprazole (PRILOSEC) 20 MG capsule Take 1 capsule (20 mg total) by mouth daily. (Patient not taking: Reported on 11/05/2017) 30 capsule 3   No current facility-administered medications on file prior to visit.     BP 118/84 (BP Location: Left Arm, Patient Position: Sitting, Cuff Size: Normal)   Pulse 88   Temp 98.7 F (37.1 C) (Oral)   Resp 16   Wt 134 lb (60.8 kg)   SpO2 98%   BMI 23.74 kg/m     Review of Systems See HPI    Objective:   Physical Exam  Constitutional: She is oriented to person, place, and time. She appears well-developed and well-nourished.  Eyes: Pupils are equal, round, and reactive to light. No scleral icterus.  Neck: Neck supple.  Cardiovascular: Normal rate, regular rhythm and intact distal pulses.  Pulmonary/Chest: Effort normal and breath sounds normal. She has no wheezes. She has no rales.  Abdominal: Soft. Bowel sounds are normal. There is no tenderness.  Musculoskeletal: She exhibits no edema.  Lymphadenopathy:    She has no cervical adenopathy.  Neurological: She is alert and oriented to person, place, and time.  Skin:  Skin is warm and dry. No rash noted.  Psychiatric: She has a normal mood and affect. Her behavior is normal. Judgment and thought content normal.          Assessment & Plan:  1. Atypical chest pain Atypical in nature. No change since last evaluation 2 days ago. Symptom occurs intermittently and this may likely be associated with anxiety which is noted with recent low TSH or  reflux. She has been referred to cardiology and has scheduled an appointment. Advised her to seek medical attention immediately if chest pain persists or shortness of breath occurs. She voiced understanding and agreed with plan.   2. Hypothyroidism, unspecified type Recent TSH indicated over treatment with Synthroid. Will decrease from 100 mcg to 88 mcg today and advised follow up TSH in 6 weeks. Discussed with PCP who is in agreement. She has a follow up with PCP this month and will keep this follow up appointment. She will also monitor her BP and notify office if >140/90. We reviewed proper use of this medication and avoiding this with other medications and food. She requested information of her decrease in dose of Synthroid be communicated with Dr. Fredderick Severance who is managing her thyroid medication. Patient note will be faxed to this provider as requested. She voiced understanding and agreed with plan. - levothyroxine (SYNTHROID) 88 MCG tablet; Take 1 tablet (88 mcg total) by mouth daily before breakfast.  Dispense: 30 tablet; Refill: [place], FNP-C

## 2017-11-05 NOTE — Telephone Encounter (Signed)
Spoke with Erin [name] regarding this. She is currently seeing the patient. She is going to decrease the synthroid dose. Please see her documentation.

## 2017-11-05 NOTE — Telephone Encounter (Signed)
Patient seen in the office today by FNP.

## 2017-11-05 NOTE — Telephone Encounter (Signed)
Pt called to talk about her wanting to cut her Synthroid in half this morning. She has been having a feeling of heaviness  in her chest since last week.  She was given a result of decrease in her thyroid level and thought it was coming from that. She is denying pain, n/v, headache, blurred vision, sweating, fever or dizziness. She stated that she had had an EKG last week for the same symptoms and it came back ok.  Per protocol, pt has an appointment today with J. Kordsmeier. If symptoms increase, pt understands to call us back so we can direct her care.   Reason for Disposition . [1] Chest pain lasts > 5 minutes AND [2] occurred > 3 days ago (72 hours) AND [3] NO chest pain or cardiac symptoms now  Answer Assessment - Initial Assessment Questions 1. LOCATION: "Where does it hurt?"       Mid of chest feels heavy 2. RADIATION: "Does the pain go anywhere else?" (e.g., into neck, jaw, arms, back)     no 3. ONSET: "When did the chest pain begin?" (Minutes, hours or days)      Started on and off last week 4. PATTERN "Does the pain come and go, or has it been constant since it started?"  "Does it get worse with exertion?"      Feels it more when ;moving around, not so much when sitting 5. DURATION: "How long does it last" (e.g., seconds, minutes, hours)     constant 6. SEVERITY: "How bad is the pain?"  (e.g., Scale 1-10; mild, moderate, or severe)    - MILD (1-3): doesn't interfere with normal activities     - MODERATE (4-7): interferes with normal activities or awakens from sleep    - SEVERE (8-10): excruciating pain, unable to do any normal activities       No pain 7. CARDIAC RISK FACTORS: "Do you have any history of heart problems or risk factors for heart disease?" (e.g., prior heart attack, angina; high blood pressure, diabetes, being overweight, high cholesterol, smoking, or strong family history of heart disease)     Family history of heart disease 8. PULMONARY RISK FACTORS: "Do you have  any history of lung disease?"  (e.g., blood clots in lung, asthma, emphysema, birth control pills)     no 9. CAUSE: "What do you think is causing the chest pain?"     Thinks it may be from low synthroid level 10. OTHER SYMPTOMS: "Do you have any other symptoms?" (e.g., dizziness, nausea, vomiting, sweating, fever, difficulty breathing, cough)       no 11. PREGNANCY: "Is there any chance you are pregnant?" "When was your last menstrual period?"       no  Protocols used: CHEST PAIN-A-AH

## 2017-11-17 DIAGNOSIS — Z01419 Encounter for gynecological examination (general) (routine) without abnormal findings: Secondary | ICD-10-CM | POA: Diagnosis not present

## 2017-11-17 DIAGNOSIS — Z124 Encounter for screening for malignant neoplasm of cervix: Secondary | ICD-10-CM | POA: Diagnosis not present

## 2017-11-17 LAB — HM PAP SMEAR: HM PAP: NEGATIVE

## 2017-11-27 ENCOUNTER — Other Ambulatory Visit: Payer: Self-pay

## 2017-11-27 ENCOUNTER — Ambulatory Visit (INDEPENDENT_AMBULATORY_CARE_PROVIDER_SITE_OTHER): Payer: Medicare HMO | Admitting: Family Medicine

## 2017-11-27 ENCOUNTER — Encounter: Payer: Self-pay | Admitting: Family Medicine

## 2017-11-27 VITALS — BP 100/70 | HR 84 | Temp 98.4°F | Ht 63.0 in | Wt 135.6 lb

## 2017-11-27 DIAGNOSIS — Z0000 Encounter for general adult medical examination without abnormal findings: Secondary | ICD-10-CM

## 2017-11-27 DIAGNOSIS — Z130 Encounter for screening for diseases of the blood and blood-forming organs and certain disorders involving the immune mechanism: Secondary | ICD-10-CM | POA: Diagnosis not present

## 2017-11-27 DIAGNOSIS — E039 Hypothyroidism, unspecified: Secondary | ICD-10-CM | POA: Diagnosis not present

## 2017-11-27 DIAGNOSIS — Z13220 Encounter for screening for lipoid disorders: Secondary | ICD-10-CM

## 2017-11-27 LAB — CBC
HCT: 41.7 % (ref 36.0–46.0)
Hemoglobin: 14.2 g/dL (ref 12.0–15.0)
MCHC: 34 g/dL (ref 30.0–36.0)
MCV: 91.1 fl (ref 78.0–100.0)
PLATELETS: 278 10*3/uL (ref 150.0–400.0)
RBC: 4.58 Mil/uL (ref 3.87–5.11)
RDW: 13.9 % (ref 11.5–15.5)
WBC: 8.7 10*3/uL (ref 4.0–10.5)

## 2017-11-27 LAB — LIPID PANEL
CHOL/HDL RATIO: 5
Cholesterol: 207 mg/dL — ABNORMAL HIGH (ref 0–200)
HDL: 42.5 mg/dL (ref >39.00)
LDL Cholesterol: 129 mg/dL — ABNORMAL HIGH (ref 0–99)
NonHDL: 164.92
Triglycerides: 178 mg/dL — ABNORMAL HIGH (ref 0.0–149.0)
VLDL: 35.6 mg/dL (ref 0.0–40.0)

## 2017-11-27 LAB — COMPREHENSIVE METABOLIC PANEL
ALBUMIN: 4.2 g/dL (ref 3.5–5.2)
ALK PHOS: 60 U/L (ref 39–117)
ALT: 16 U/L (ref 0–35)
AST: 20 U/L (ref 0–37)
BUN: 13 mg/dL (ref 6–23)
CO2: 28 mEq/L (ref 19–32)
Calcium: 9.2 mg/dL (ref 8.4–10.5)
Chloride: 105 mEq/L (ref 96–112)
Creatinine, Ser: 0.8 mg/dL (ref 0.40–1.20)
GFR: 76.36 mL/min (ref >60.00)
GLUCOSE: 98 mg/dL (ref 70–99)
POTASSIUM: 3.9 meq/L (ref 3.5–5.1)
Sodium: 142 mEq/L (ref 135–145)
TOTAL PROTEIN: 6.9 g/dL (ref 6.0–8.3)
Total Bilirubin: 0.4 mg/dL (ref 0.2–1.2)

## 2017-11-27 NOTE — Assessment & Plan Note (Signed)
Physical exam completed.  Encouraged continued diet and exercise.  We will request Pap smear results.  We will request records from employee health regarding vaccination history as well as hepatitis C screening.  Lab work as outlined below.  She will let us know if she would like pneumonia vaccines and shingles vaccine.

## 2017-11-27 NOTE — Patient Instructions (Signed)
Nice to see you. Please continue to stay active and monitor your diet. We will request records regarding your Pap smear as well as your prior vaccines and screening history through employee health. We will have you return in 3 weeks for repeat thyroid test. If you would like the pneumonia vaccine or the shingles vaccine please let us know.

## 2017-11-27 NOTE — Progress Notes (Signed)
[name], MD Phone: [phone]  Erin [name] is a 67 y.o. female who presents today for cpe.  Exercises by going to the gym daily and doing weights and cardio. Eats healthfully with plenty of vegetables.  Drinking more water.  No fried fatty foods.  No soda or sweet tea. Pap smear through gynecology 2 days ago.  She is postmenopausal. Family history of pancreatic cancer in her maternal grandmother.  No other family history of cancer. She declines HIV screening. She believes she has had a tetanus vaccination in the last 10 years. She believes she was screened for hepatitis C in the past. She has opted to wait on the pneumonia vaccine.  She declines shingles vaccine as well. Mammogram is up-to-date. Colonoscopy is up-to-date. No tobacco use or illicit drug use.  Social alcohol use with 2-3 beers once a week. She sees a Pharmacist, community every 6 months.  Ophthalmologist once a year. Reports she feels quite a bit better after decreasing her Synthroid dose.  Active Ambulatory Problems    Diagnosis Date Noted  . GERD (gastroesophageal reflux disease) 04/19/2012  . Hyperlipidemia LDL goal < 100 04/19/2012  . Hypothyroidism 04/19/2012  . Routine general medical examination at a health care facility 10/20/2012  . Cervical cancer screening 04/25/2013  . Osteopenia 06/08/2013  . Arthralgia 08/11/2013  . Right hip pain 10/24/2013  . Adjustment disorder with mixed anxiety and depressed mood 11/26/2015  . Neck pain on right side 07/09/2016  . Colon polyps 05/28/2017  . Atypical chest pain 11/03/2017   Resolved Ambulatory Problems    Diagnosis Date Noted  . Shingles rash 04/27/2012  . Viral URI with cough 04/25/2013  . Shoulder bursitis 11/06/2014   Past Medical History:  Diagnosis Date  . Cancer ([place]) 2011  . Thyroid disease     Family History  Problem Relation Age of Onset  . Stroke Mother   . Heart disease Father   . Cancer Maternal Grandmother        pancreatic    Social  History   Socioeconomic History  . Marital status: Single    Spouse name: Not on file  . Number of children: Not on file  . Years of education: Not on file  . Highest education level: Not on file  Occupational History  . Not on file  Social Needs  . Financial resource strain: Not on file  . Food insecurity:    Worry: Not on file    Inability: Not on file  . Transportation needs:    Medical: Not on file    Non-medical: Not on file  Tobacco Use  . Smoking status: Never Smoker  . Smokeless tobacco: Never Used  Substance and Sexual Activity  . Alcohol use: Yes    Alcohol/week: 0.0 oz    Comment: socially  . Drug use: No  . Sexual activity: Never    Partners: Female  Lifestyle  . Physical activity:    Days per week: Not on file    Minutes per session: Not on file  . Stress: Not on file  Relationships  . Social connections:    Talks on phone: Not on file    Gets together: Not on file    Attends religious service: Not on file    Active member of club or organization: Not on file    Attends meetings of clubs or organizations: Not on file    Relationship status: Not on file  . Intimate partner violence:    Fear of  current or ex partner: Not on file    Emotionally abused: Not on file    Physically abused: Not on file    Forced sexual activity: Not on file  Other Topics Concern  . Not on file  Social History Narrative   Lives in [place].      Work - [employer]:  Negative for nexplained weight loss, fever Skin: Negative for new or changing mole, sore that won't heal HEENT: Negative for trouble hearing, trouble seeing, ringing in ears, mouth sores, hoarseness, change in voice, dysphagia. CV:  Negative for chest pain, dyspnea, edema, palpitations Resp: Negative for cough, dyspnea, hemoptysis GI: Negative for nausea, vomiting, diarrhea, constipation, abdominal pain, melena, hematochezia. GU: Negative for dysuria, incontinence, urinary hesitance, hematuria,  vaginal or penile discharge, polyuria, sexual difficulty, lumps in testicle or breasts MSK: Negative for muscle cramps or aches, joint pain or swelling Neuro: Negative for headaches, weakness, numbness, dizziness, passing out/fainting Psych: Negative for depression, anxiety, memory problems  Objective  Physical Exam Vitals:   11/27/17 0759  BP: 100/70  Pulse: 84  Temp: 98.4 F (36.9 C)  SpO2: 96%    BP Readings from Last 3 Encounters:  11/27/17 100/70  11/05/17 118/84  11/03/17 118/80   Wt Readings from Last 3 Encounters:  11/27/17 135 lb 9.6 oz (61.5 kg)  11/05/17 134 lb (60.8 kg)  11/03/17 135 lb 6.4 oz (61.4 kg)    Physical Exam  Constitutional: No distress.  HENT:  Head: Normocephalic and atraumatic.  Mouth/Throat: Oropharynx is clear and moist.  Eyes: Pupils are equal, round, and reactive to light. Conjunctivae are normal.  Neck: Neck supple.  Cardiovascular: Normal rate, regular rhythm and normal heart sounds.  Pulmonary/Chest: Effort normal and breath sounds normal.  Abdominal: Soft. Bowel sounds are normal. She exhibits no distension. There is no tenderness.  Genitourinary:  Genitourinary Comments: Right lateral breast with no skin changes, nipple inversion, masses, or tenderness, no axillary masses bilaterally, small punctate hole at the site of a previous cyst at her left lower axilla  Musculoskeletal: She exhibits no edema.  Lymphadenopathy:    She has no cervical adenopathy.  Neurological: She is alert.  Skin: Skin is warm and dry. She is not diaphoretic.  Psychiatric: She has a normal mood and affect.     Assessment/Plan:   Routine general medical examination at a health care facility Physical exam completed.  Encouraged continued diet and exercise.  We will request Pap smear results.  We will request records from employee health regarding vaccination history as well as hepatitis C screening.  Lab work as outlined below.  She will let us know if she  would like pneumonia vaccines and shingles vaccine.   Orders Placed This Encounter  Procedures  . TSH    Standing Status:   Future    Standing Expiration Date:   11/28/2018  . Comp Met (CMET)  . CBC  . Lipid panel    No orders of the defined types were placed in this encounter.    [name], MD [place]

## 2017-12-16 NOTE — Progress Notes (Signed)
Cardiology Office Note  Date:  12/17/2017   ID:  Erin [name], DOB 02/17/1951, MRN [id number]  PCP:  Leone Haven, MD   Chief Complaint  Patient presents with  . other    Atypical chest pain. Meds reviewed verbally with pt.    HPI:  [name] is a 67 yo woman with PMH of  GERD Atypical chest pain Adjustment disorder anxiety depressed mood Nonsmoker, no diabetes Referred by Dr. Caryl Bis for consultation of her chest pain symptoms  In early April she reports having high blood pressure Chest pain with exertion and rest, "not pain, "funny feeling" Etiology was unclear  Went to primary care physician had lab work done [facility] was low Thyroid supplement was adjusted Since then she has felt better 100 ug, down to 88 ug  Typically she isActive,[facility]es to the [facility], walks miles at a time without symptoms Does lots of cardio  Periodic anxiety Per the notes   EKG personally reviewed by myself on todays visit Shows normal sinus rhythm rate 89 bpm no significant ST or T-wave changes  History of father with MI at age 72, died, smoker,  Mom with CVA, non smoker  PMH:   has a past medical history of Cancer ([place]) (2011), Hyperlipidemia, and Thyroid disease.  PSH:    Past Surgical History:  Procedure Laterality Date  . CHOLECYSTECTOMY    . COLONOSCOPY WITH PROPOFOL N/A 12/23/2016   Procedure: COLONOSCOPY WITH PROPOFOL;  Surgeon: Lollie Sails, MD;  Location: [facility] ENDOSCOPY;  Service: Endoscopy;  Laterality: N/A;  . GALLBLADDER SURGERY  2000  . KNEE SURGERY  2003/ 2010   right knee x2 Dr. Marry Guan for torn meniscus  . [place]   right, arthritis  . THYROIDECTOMY  2011   right side    Current Outpatient Medications  Medication Sig Dispense Refill  . amitriptyline (ELAVIL) 50 MG tablet TAKE 1 TABLET (50 MG TOTAL) BY MOUTH AT BEDTIME. 90 tablet 1  . Calcium Carb-Cholecalciferol (CALCIUM 1000 + D PO) Take by mouth.    . cholecalciferol (VITAMIN D) 1000 UNITS  tablet Take 1,000 Units by mouth daily.    . Cinnamon 500 MG capsule Take 500 mg by mouth daily.    Marland Kitchen levothyroxine (SYNTHROID) 88 MCG tablet Take 1 tablet (88 mcg total) by mouth daily before breakfast. 30 tablet 3  . Multiple Vitamins-Minerals (CENTRUM SILVER PO) Take by mouth.    Marland Kitchen omeprazole (PRILOSEC) 20 MG capsule Take 1 capsule (20 mg total) by mouth daily. 30 capsule 3   No current facility-administered medications for this visit.      Allergies:   Amoxicillin; Atorvastatin; and Codeine   Social History:  The patient  reports that she has never smoked. She has never used smokeless tobacco. She reports that she drinks alcohol. She reports that she does not use drugs.   Family History:   family history includes Cancer in her maternal grandmother; Heart attack in her father; Heart disease in her father; Stroke in her mother.    Review of Systems: Review of Systems  Constitutional: Negative.   Respiratory: Negative.   Cardiovascular: Negative.        Chest pressure  Gastrointestinal: Negative.   Musculoskeletal: Negative.   Neurological: Negative.   Psychiatric/Behavioral: Negative.   All other systems reviewed and are negative.    PHYSICAL EXAM: VS:  BP 114/78 (BP Location: Right Arm, Patient Position: Sitting, Cuff Size: Normal)   Pulse 89   Ht 5\' 3"  (  1.6 m)   Wt 133 lb 4 oz (60.4 kg)   BMI 23.60 kg/m  , BMI Body mass index is 23.6 kg/m. GEN: Well nourished, well developed, in no acute distress  HEENT: normal  Neck: no JVD, carotid bruits, or masses Cardiac: RRR; no murmurs, rubs, or gallops,no edema  Respiratory:  clear to auscultation bilaterally, normal work of breathing GI: soft, nontender, nondistended, + BS MS: no deformity or atrophy  Skin: warm and dry, no rash Neuro:  Strength and sensation are intact Psych: euthymic mood, full affect    Recent Labs: 11/03/2017: TSH 0.10 11/27/2017: ALT 16; BUN 13; Creatinine, Ser 0.80; Hemoglobin 14.2; Platelets  278.0; Potassium 3.9; Sodium 142    Lipid Panel Lab Results  Component Value Date   CHOL 207 (H) 11/27/2017   HDL 42.50 11/27/2017   LDLCALC 129 (H) 11/27/2017   TRIG 178.0 (H) 11/27/2017      Wt Readings from Last 3 Encounters:  12/17/17 133 lb 4 oz (60.4 kg)  11/27/17 135 lb 9.6 oz (61.5 kg)  11/05/17 134 lb (60.8 kg)       ASSESSMENT AND PLAN:  Chest pain  Plan: EKG 12-Lead Strong family history though father was a smoker Father had strokes She reports some chest pressure early April but symptoms have resolved Back to full exercise, aerobic activities without symptoms EKG and clinical exam are normal Feels her symptoms were secondary to abnormal thyroid Discussed various types of testing including stress tests and screening studies such as CT coronary calcium scoring She'll call us if she would like to CT coronary calcium score  Adjustment disorder with mixed anxiety and depressed mood Managed by primary care Exercising  Mixed hyperlipidemia Mildly elevated cholesterol, not on a statin Discussed with her at length, Would leave it up to her whether she would like risk stratification study to help guide management of her cholesterol such as with CT coronary calcium scoring  Hypothyroidism, unspecified type Recent medication adjustment down to 88 Feels better  Gastroesophageal reflux disease, esophagitis presence not specified On proton pump inhibitor  Disposition:   F/U  As needed   Total encounter time more than 60 minutes  Greater than 50% was spent in counseling and coordination of care with the patient    Orders Placed This Encounter  Procedures  . EKG 12-Lead     Signed, Esmond Plants, M.D., Ph.D. 12/17/2017  [place], [place]

## 2017-12-17 ENCOUNTER — Encounter: Payer: Self-pay | Admitting: Cardiovascular Disease

## 2017-12-17 ENCOUNTER — Ambulatory Visit: Payer: Medicare HMO | Admitting: Cardiovascular Disease

## 2017-12-17 VITALS — BP 114/78 | HR 89 | Ht 63.0 in | Wt 133.2 lb

## 2017-12-17 DIAGNOSIS — E039 Hypothyroidism, unspecified: Secondary | ICD-10-CM | POA: Diagnosis not present

## 2017-12-17 DIAGNOSIS — R079 Chest pain, unspecified: Secondary | ICD-10-CM

## 2017-12-17 DIAGNOSIS — E782 Mixed hyperlipidemia: Secondary | ICD-10-CM

## 2017-12-17 DIAGNOSIS — F4323 Adjustment disorder with mixed anxiety and depressed mood: Secondary | ICD-10-CM | POA: Diagnosis not present

## 2017-12-17 DIAGNOSIS — R69 Illness, unspecified: Secondary | ICD-10-CM | POA: Diagnosis not present

## 2017-12-17 DIAGNOSIS — K219 Gastro-esophageal reflux disease without esophagitis: Secondary | ICD-10-CM | POA: Diagnosis not present

## 2017-12-17 NOTE — Patient Instructions (Signed)
Call the office if you have more chest pain, Especially with exertion   Medication Instructions:   No medication changes made  Labwork:  No new labs needed  Testing/Procedures:  No further testing at this time  Research CT coronary calcium score [place],   $150   Follow-Up: It was a pleasure seeing you in the office today. Please call us if you have new issues that need to be addressed before your next appt.  [phone]  Your physician wants you to follow-up in:  As needed  If you need a refill on your cardiac medications before your next appointment, please call your pharmacy.  For educational health videos Log in to : [email or web address] Or : [email or web address], password : [email or web address]

## 2017-12-21 ENCOUNTER — Other Ambulatory Visit: Payer: Self-pay | Admitting: Family Medicine

## 2017-12-21 ENCOUNTER — Other Ambulatory Visit (INDEPENDENT_AMBULATORY_CARE_PROVIDER_SITE_OTHER): Payer: Medicare HMO

## 2017-12-21 DIAGNOSIS — E039 Hypothyroidism, unspecified: Secondary | ICD-10-CM

## 2017-12-21 LAB — TSH: TSH: 0.19 u[IU]/mL — ABNORMAL LOW (ref 0.35–4.50)

## 2017-12-21 MED ORDER — LEVOTHYROXINE SODIUM 75 MCG PO TABS: 75.0000 ug | ORAL_TABLET | Freq: Every day | ORAL | 3 refills | Status: DC

## 2017-12-30 ENCOUNTER — Encounter: Payer: Self-pay | Admitting: Family Medicine

## 2017-12-30 ENCOUNTER — Telehealth: Payer: Self-pay | Admitting: Family Medicine

## 2017-12-30 NOTE — Telephone Encounter (Signed)
ROI received from [facility] on 12/30/2017

## 2018-01-06 ENCOUNTER — Encounter: Payer: Self-pay | Admitting: Family Medicine

## 2018-01-20 ENCOUNTER — Other Ambulatory Visit: Payer: Self-pay | Admitting: Family Medicine

## 2018-01-26 ENCOUNTER — Encounter: Payer: Self-pay | Admitting: Family Medicine

## 2018-01-28 DIAGNOSIS — E890 Postprocedural hypothyroidism: Secondary | ICD-10-CM | POA: Diagnosis not present

## 2018-01-28 DIAGNOSIS — K229 Disease of esophagus, unspecified: Secondary | ICD-10-CM | POA: Diagnosis not present

## 2018-01-28 DIAGNOSIS — C73 Malignant neoplasm of thyroid gland: Secondary | ICD-10-CM | POA: Diagnosis not present

## 2018-01-28 DIAGNOSIS — M1991 Primary osteoarthritis, unspecified site: Secondary | ICD-10-CM | POA: Diagnosis not present

## 2018-01-28 DIAGNOSIS — E063 Autoimmune thyroiditis: Secondary | ICD-10-CM | POA: Diagnosis not present

## 2018-01-28 DIAGNOSIS — E039 Hypothyroidism, unspecified: Secondary | ICD-10-CM | POA: Diagnosis not present

## 2018-01-28 DIAGNOSIS — E785 Hyperlipidemia, unspecified: Secondary | ICD-10-CM | POA: Diagnosis not present

## 2018-02-01 ENCOUNTER — Other Ambulatory Visit: Payer: Medicare HMO

## 2018-02-10 DIAGNOSIS — K229 Disease of esophagus, unspecified: Secondary | ICD-10-CM | POA: Diagnosis not present

## 2018-02-10 DIAGNOSIS — E890 Postprocedural hypothyroidism: Secondary | ICD-10-CM | POA: Diagnosis not present

## 2018-02-10 DIAGNOSIS — E063 Autoimmune thyroiditis: Secondary | ICD-10-CM | POA: Diagnosis not present

## 2018-02-10 DIAGNOSIS — E785 Hyperlipidemia, unspecified: Secondary | ICD-10-CM | POA: Diagnosis not present

## 2018-02-10 DIAGNOSIS — C73 Malignant neoplasm of thyroid gland: Secondary | ICD-10-CM | POA: Diagnosis not present

## 2018-03-29 ENCOUNTER — Other Ambulatory Visit: Payer: Self-pay | Admitting: Family Medicine

## 2018-04-13 ENCOUNTER — Other Ambulatory Visit: Payer: Self-pay | Admitting: Family Medicine

## 2018-04-13 DIAGNOSIS — E039 Hypothyroidism, unspecified: Secondary | ICD-10-CM

## 2018-04-14 ENCOUNTER — Encounter: Payer: Self-pay | Admitting: Family Medicine

## 2018-04-14 ENCOUNTER — Other Ambulatory Visit: Payer: Self-pay

## 2018-04-14 DIAGNOSIS — E039 Hypothyroidism, unspecified: Secondary | ICD-10-CM

## 2018-04-14 MED ORDER — LEVOTHYROXINE SODIUM 75 MCG PO TABS: 75.0000 ug | ORAL_TABLET | Freq: Every day | ORAL | 3 refills | Status: DC

## 2018-05-17 ENCOUNTER — Encounter: Payer: Self-pay | Admitting: Family Medicine

## 2018-05-17 MED ORDER — SCOPOLAMINE 1 MG/3DAYS TD PT72: 1.0000 | MEDICATED_PATCH | TRANSDERMAL | 0 refills | Status: DC

## 2018-05-20 ENCOUNTER — Encounter: Payer: Self-pay | Admitting: Family Medicine

## 2018-05-24 ENCOUNTER — Telehealth: Payer: Self-pay | Admitting: Family Medicine

## 2018-05-24 NOTE — Telephone Encounter (Signed)
Copied from [place] [id number]. Topic: Conservator, museum/gallery Patient (Clinic Use ONLY) >> May 24, 2018  5:08 PM Cecelia Byars, NT wrote: Reason for CRM: Patient says office called and did not leave a message please call her at [phone] She is

## 2018-05-25 NOTE — Telephone Encounter (Signed)
Patient advised of below and verbalized understanding.  

## 2018-05-25 NOTE — Telephone Encounter (Signed)
See phone note

## 2018-05-25 NOTE — Telephone Encounter (Signed)
I am not sure who called her though I did hear back from our clinical pharmacist regarding her scopolamine patches.  It does not appear that there is any way to get the price down through her insurance.  She noted the best thing would be to try over-the-counter meclizine though that could make her quite drowsy.  She could also try wrist bands called sea bands which could be helpful.

## 2018-05-25 NOTE — Telephone Encounter (Signed)
Looks like there is Electrical engineer from [place] ,

## 2018-06-07 ENCOUNTER — Other Ambulatory Visit: Payer: Self-pay

## 2018-06-07 ENCOUNTER — Ambulatory Visit: Payer: Medicare HMO | Admitting: Family Medicine

## 2018-06-07 ENCOUNTER — Ambulatory Visit (INDEPENDENT_AMBULATORY_CARE_PROVIDER_SITE_OTHER): Payer: Medicare HMO | Admitting: Family Medicine

## 2018-06-07 ENCOUNTER — Encounter: Payer: Self-pay | Admitting: Family Medicine

## 2018-06-07 VITALS — BP 118/86 | HR 89 | Temp 98.3°F | Ht 63.0 in | Wt 136.0 lb

## 2018-06-07 DIAGNOSIS — R69 Illness, unspecified: Secondary | ICD-10-CM | POA: Diagnosis not present

## 2018-06-07 DIAGNOSIS — F4323 Adjustment disorder with mixed anxiety and depressed mood: Secondary | ICD-10-CM | POA: Diagnosis not present

## 2018-06-07 DIAGNOSIS — E039 Hypothyroidism, unspecified: Secondary | ICD-10-CM | POA: Diagnosis not present

## 2018-06-07 DIAGNOSIS — K219 Gastro-esophageal reflux disease without esophagitis: Secondary | ICD-10-CM

## 2018-06-07 DIAGNOSIS — Z23 Encounter for immunization: Secondary | ICD-10-CM

## 2018-06-07 LAB — CBC WITH DIFFERENTIAL/PLATELET
BASOS PCT: 0.5 % (ref 0.0–3.0)
Basophils Absolute: 0 10*3/uL (ref 0.0–0.1)
EOS ABS: 0.3 10*3/uL (ref 0.0–0.7)
Eosinophils Relative: 3.4 % (ref 0.0–5.0)
HEMATOCRIT: 42.3 % (ref 36.0–46.0)
HEMOGLOBIN: 14.3 g/dL (ref 12.0–15.0)
LYMPHS PCT: 27.9 % (ref 12.0–46.0)
Lymphs Abs: 2.3 10*3/uL (ref 0.7–4.0)
MCHC: 33.8 g/dL (ref 30.0–36.0)
MCV: 91.9 fl (ref 78.0–100.0)
MONOS PCT: 6.2 % (ref 3.0–12.0)
Monocytes Absolute: 0.5 10*3/uL (ref 0.1–1.0)
NEUTROS ABS: 5 10*3/uL (ref 1.4–7.7)
Neutrophils Relative %: 62 % (ref 43.0–77.0)
PLATELETS: 295 10*3/uL (ref 150.0–400.0)
RBC: 4.6 Mil/uL (ref 3.87–5.11)
RDW: 14 % (ref 11.5–15.5)
WBC: 8.1 10*3/uL (ref 4.0–10.5)

## 2018-06-07 LAB — COMPREHENSIVE METABOLIC PANEL
ALBUMIN: 4.3 g/dL (ref 3.5–5.2)
ALK PHOS: 59 U/L (ref 39–117)
ALT: 21 U/L (ref 0–35)
AST: 23 U/L (ref 0–37)
BILIRUBIN TOTAL: 0.4 mg/dL (ref 0.2–1.2)
BUN: 15 mg/dL (ref 6–23)
CALCIUM: 9.2 mg/dL (ref 8.4–10.5)
CHLORIDE: 105 meq/L (ref 96–112)
CO2: 29 meq/L (ref 19–32)
Creatinine, Ser: 0.82 mg/dL (ref 0.40–1.20)
GFR: 74.09 mL/min (ref >60.00)
Glucose, Bld: 100 mg/dL — ABNORMAL HIGH (ref 70–99)
POTASSIUM: 3.9 meq/L (ref 3.5–5.1)
Sodium: 142 mEq/L (ref 135–145)
Total Protein: 7 g/dL (ref 6.0–8.3)

## 2018-06-07 LAB — MAGNESIUM: Magnesium: 2.2 mg/dL (ref 1.5–2.5)

## 2018-06-07 NOTE — Progress Notes (Signed)
Subjective:    Patient ID: Erin [name], female    DOB: Mar 07, 1951, 67 y.o.   MRN: [id number]  HPI  Presents to clinic for 6 month follow up on thyroid, GERD, adjustment disorder with depressed mood.   Patient is currently on levothyroxine 75 mcg for hypothyroidism, she also follows with endocrine due to history of thyroid nodules.  She feels great on this dose, states before they found the correct dose she was feeling tired, but now the dose is at 75 she feels very well.  GERD is stable on omeprazole 20 mg.  Patient does not have any breakthrough feelings of heartburn.  Patient uses amitriptyline 50 mg at night to help her relax and get good sleep.  No breakthrough anxiety or depression at this time.  Denies any suicidal or homicidal ideation.  Patient Active Problem List   Diagnosis Date Noted  . Chest pain with moderate risk for cardiac etiology 11/03/2017  . Colon polyps 05/28/2017  . Neck pain on right side 07/09/2016  . Adjustment disorder with mixed anxiety and depressed mood 11/26/2015  . Right hip pain 10/24/2013  . Arthralgia 08/11/2013  . Osteopenia 06/08/2013  . Cervical cancer screening 04/25/2013  . Routine general medical examination at a health care facility 10/20/2012  . GERD (gastroesophageal reflux disease) 04/19/2012  . Hyperlipidemia 04/19/2012  . Hypothyroidism 04/19/2012   Social History   Tobacco Use  . Smoking status: Never Smoker  . Smokeless tobacco: Never Used  Substance Use Topics  . Alcohol use: Yes    Alcohol/week: 0.0 standard drinks    Comment: socially   Review of Systems  Constitutional: Negative for chills, fatigue and fever.  HENT: Negative for congestion, ear pain, sinus pain and sore throat.   Eyes: Negative.   Respiratory: Negative for cough, shortness of breath and wheezing.   Cardiovascular: Negative for chest pain, palpitations and leg swelling.  Gastrointestinal: Negative for abdominal pain, diarrhea, nausea and vomiting.    Genitourinary: Negative for dysuria, frequency and urgency.  Musculoskeletal: Negative for arthralgias and myalgias.  Skin: Negative for color change, pallor and rash.  Neurological: Negative for syncope, light-headedness and headaches.  Psychiatric/Behavioral: The patient is not nervous/anxious.       Objective:   Physical Exam  Constitutional:  She appears well-developed and well-nourished. No distress.  HENT:  Head: Normocephalic and atraumatic.  Eyes: Pupils are equal, round, and reactive to light. EOM are normal. No scleral icterus.  Neck: Normal range of motion. Neck supple. No tracheal deviation present. No thyromegaly (1/2 of thyroid has been surgically removed) Cardiovascular: Normal rate, regular rhythm and normal heart sounds.  Pulmonary/Chest: Effort normal and breath sounds normal. No respiratory distress. She has no wheezes. She has no rales.  Abdominal: Soft. Bowel sounds are normal. There is no tenderness.  Neurological: She is alert and oriented to person, place, and time.  Gait normal  Skin: Skin is warm and dry. No pallor.  Psychiatric: She has a normal mood and affect. Her behavior is normal. Thought content normal.  Nursing note and vitals reviewed.     Vitals:   06/07/18 0859  BP: 118/86  Pulse: 89  Temp: 98.3 F (36.8 C)  SpO2: 98%   Assessment & Plan:   Hypothyroidism-patient feels very well on 75 mcg levothyroxine dose.  We will get new thyroid panel in clinic today.  GERD- patient GERD symptoms are very well controlled with omeprazole 20 mg daily.  Patient declines getting a new endoscopy at this  time, states she will consider in future.  We will get CBC, CMP and magnesium level also in lab work today.  Adjustment disorder with mixed anxiety and depressed mood- no signs of anxiety or depression at this time.  She will continue amitriptyline 50 mg at bedtime and we will continue to monitor.  Flu vaccine given in clinic today  Follow-up in 6  months for recheck of chronic medical conditions.  You may return to clinic sooner if any issues arise.

## 2018-06-08 ENCOUNTER — Other Ambulatory Visit: Payer: Self-pay | Admitting: Family Medicine

## 2018-06-08 DIAGNOSIS — Z1231 Encounter for screening mammogram for malignant neoplasm of breast: Secondary | ICD-10-CM

## 2018-06-08 LAB — THYROID PANEL WITH TSH
Free Thyroxine Index: 2.5 (ref 1.4–3.8)
T3 Uptake: 31 % (ref 22–35)
T4 TOTAL: 8.1 ug/dL (ref 5.1–11.9)
TSH: 1.07 mIU/L (ref 0.40–4.50)

## 2018-06-10 ENCOUNTER — Other Ambulatory Visit: Payer: Self-pay | Admitting: Family Medicine

## 2018-06-22 ENCOUNTER — Ambulatory Visit
Admission: RE | Admit: 2018-06-22 | Discharge: 2018-06-22 | Disposition: A | Discharge status: Home / Self Care | Payer: Medicare HMO | Source: Ambulatory Visit | Attending: Family Medicine | Admitting: Family Medicine

## 2018-06-22 DIAGNOSIS — Z1231 Encounter for screening mammogram for malignant neoplasm of breast: Secondary | ICD-10-CM | POA: Diagnosis not present

## 2018-07-05 DIAGNOSIS — H2513 Age-related nuclear cataract, bilateral: Secondary | ICD-10-CM | POA: Diagnosis not present

## 2018-07-06 DIAGNOSIS — Z0100 Encounter for examination of eyes and vision without abnormal findings: Secondary | ICD-10-CM | POA: Diagnosis not present

## 2018-07-26 DIAGNOSIS — K229 Disease of esophagus, unspecified: Secondary | ICD-10-CM | POA: Diagnosis not present

## 2018-07-26 DIAGNOSIS — E785 Hyperlipidemia, unspecified: Secondary | ICD-10-CM | POA: Diagnosis not present

## 2018-07-26 DIAGNOSIS — C73 Malignant neoplasm of thyroid gland: Secondary | ICD-10-CM | POA: Diagnosis not present

## 2018-07-26 DIAGNOSIS — E063 Autoimmune thyroiditis: Secondary | ICD-10-CM | POA: Diagnosis not present

## 2018-07-26 DIAGNOSIS — E890 Postprocedural hypothyroidism: Secondary | ICD-10-CM | POA: Diagnosis not present

## 2018-08-11 DIAGNOSIS — E785 Hyperlipidemia, unspecified: Secondary | ICD-10-CM | POA: Diagnosis not present

## 2018-08-11 DIAGNOSIS — R002 Palpitations: Secondary | ICD-10-CM | POA: Diagnosis not present

## 2018-08-11 DIAGNOSIS — K229 Disease of esophagus, unspecified: Secondary | ICD-10-CM | POA: Diagnosis not present

## 2018-08-11 DIAGNOSIS — C73 Malignant neoplasm of thyroid gland: Secondary | ICD-10-CM | POA: Diagnosis not present

## 2018-08-11 DIAGNOSIS — E063 Autoimmune thyroiditis: Secondary | ICD-10-CM | POA: Diagnosis not present

## 2018-08-11 DIAGNOSIS — E890 Postprocedural hypothyroidism: Secondary | ICD-10-CM | POA: Diagnosis not present

## 2018-08-16 ENCOUNTER — Other Ambulatory Visit: Payer: Self-pay | Admitting: Family Medicine

## 2018-08-16 DIAGNOSIS — E039 Hypothyroidism, unspecified: Secondary | ICD-10-CM

## 2018-08-31 DIAGNOSIS — L570 Actinic keratosis: Secondary | ICD-10-CM | POA: Diagnosis not present

## 2018-08-31 DIAGNOSIS — R208 Other disturbances of skin sensation: Secondary | ICD-10-CM | POA: Diagnosis not present

## 2018-08-31 DIAGNOSIS — D2261 Melanocytic nevi of right upper limb, including shoulder: Secondary | ICD-10-CM | POA: Diagnosis not present

## 2018-08-31 DIAGNOSIS — D225 Melanocytic nevi of trunk: Secondary | ICD-10-CM | POA: Diagnosis not present

## 2018-08-31 DIAGNOSIS — L820 Inflamed seborrheic keratosis: Secondary | ICD-10-CM | POA: Diagnosis not present

## 2018-08-31 DIAGNOSIS — Z85828 Personal history of other malignant neoplasm of skin: Secondary | ICD-10-CM | POA: Diagnosis not present

## 2018-08-31 DIAGNOSIS — D2272 Melanocytic nevi of left lower limb, including hip: Secondary | ICD-10-CM | POA: Diagnosis not present

## 2018-08-31 DIAGNOSIS — D2262 Melanocytic nevi of left upper limb, including shoulder: Secondary | ICD-10-CM | POA: Diagnosis not present

## 2018-08-31 DIAGNOSIS — X32XXXA Exposure to sunlight, initial encounter: Secondary | ICD-10-CM | POA: Diagnosis not present

## 2018-09-03 ENCOUNTER — Encounter: Payer: Self-pay | Admitting: Family Medicine

## 2018-09-03 ENCOUNTER — Ambulatory Visit (INDEPENDENT_AMBULATORY_CARE_PROVIDER_SITE_OTHER): Payer: Medicare HMO | Admitting: Family Medicine

## 2018-09-03 DIAGNOSIS — Z0000 Encounter for general adult medical examination without abnormal findings: Secondary | ICD-10-CM | POA: Diagnosis not present

## 2018-09-03 NOTE — Assessment & Plan Note (Addendum)
Physical exam completed.  Patient is not due for lab work yet.  She will continue diet and exercise.  She will return in 4 months for follow-up and lab work.  She declined hepatitis C screening, Pneumovax, and Shingrix.  Patient will try to get her tetanus vaccine records from employee health.

## 2018-09-03 NOTE — Progress Notes (Signed)
Erin Rumps, MD Phone: [phone]  Erin [name] is a 68 y.o. female who presents today for cpe.  Exercises daily with weights and walking. Diet is healthy with lean meats and vegetables.  No soda.  Rare sweet tea.  Not many sweets. Mammogram up-to-date 06/22/2018.  This was negative. Colonoscopy up-to-date 12/23/2016 with 5-year recall. Pap smear 11/30/2017-.  Follows with gynecology. She declines hepatitis C screening. She believes she had tetanus vaccination through employee health.  She will check on records.  She received flu vaccine.  She declines Pneumovax and Shingrix. No menstrual cycles. No family history of breast, ovarian, or colon cancer. No tobacco use or illicit drug use. Occasional alcohol use on the weekend. Sees a dentist and an ophthalmologist.  Active Ambulatory Problems    Diagnosis Date Noted  . GERD (gastroesophageal reflux disease) 04/19/2012  . Hyperlipidemia 04/19/2012  . Hypothyroidism 04/19/2012  . Routine general medical examination at a health care facility 10/20/2012  . Cervical cancer screening 04/25/2013  . Osteopenia 06/08/2013  . Arthralgia 08/11/2013  . Right hip pain 10/24/2013  . Adjustment disorder with mixed anxiety and depressed mood 11/26/2015  . Neck pain on right side 07/09/2016  . Colon polyps 05/28/2017  . Chest pain with moderate risk for cardiac etiology 11/03/2017   Resolved Ambulatory Problems    Diagnosis Date Noted  . Shingles rash 04/27/2012  . Viral URI with cough 04/25/2013  . Shoulder bursitis 11/06/2014   Past Medical History:  Diagnosis Date  . Cancer ([place]) 2011  . Thyroid disease     Family History  Problem Relation Age of Onset  . Stroke Mother   . Heart disease Father   . Heart attack Father   . Cancer Maternal Grandmother        pancreatic  . Breast cancer Neg Hx     Social History   Socioeconomic History  . Marital status: Single    Spouse name: Not on file  . Number of children: Not on  file  . Years of education: Not on file  . Highest education level: Not on file  Occupational History  . Not on file  Social Needs  . Financial resource strain: Not on file  . Food insecurity:    Worry: Not on file    Inability: Not on file  . Transportation needs:    Medical: Not on file    Non-medical: Not on file  Tobacco Use  . Smoking status: Never Smoker  . Smokeless tobacco: Never Used  Substance and Sexual Activity  . Alcohol use: Yes    Alcohol/week: 0.0 standard drinks    Comment: socially  . Drug use: No  . Sexual activity: Never    Partners: Female  Lifestyle  . Physical activity:    Days per week: Not on file    Minutes per session: Not on file  . Stress: Not on file  Relationships  . Social connections:    Talks on phone: Not on file    Gets together: Not on file    Attends religious service: Not on file    Active member of club or organization: Not on file    Attends meetings of clubs or organizations: Not on file    Relationship status: Not on file  . Intimate partner violence:    Fear of current or ex partner: Not on file    Emotionally abused: Not on file    Physically abused: Not on file    Forced sexual activity:  Not on file  Other Topics Concern  . Not on file  Social History Narrative   Lives in [place].      Work - [employer]:  Negative for nexplained weight loss, fever Skin: Negative for new or changing mole, sore that won't heal HEENT: Positive for chronic tinnitus, negative for trouble hearing, trouble seeing, mouth sores, hoarseness, change in voice, dysphagia. CV:  Negative for chest pain, dyspnea, edema, palpitations Resp: Negative for cough, dyspnea, hemoptysis GI: Negative for nausea, vomiting, diarrhea, constipation, abdominal pain, melena, hematochezia. GU: Negative for dysuria, incontinence, urinary hesitance, hematuria, vaginal or penile discharge, polyuria, sexual difficulty, lumps in testicle or breasts MSK:  Negative for muscle cramps or aches, positive for chronic joint pain Neuro: Negative for headaches, weakness, numbness, dizziness, passing out/fainting Psych: Negative for depression, anxiety, memory problems  Objective  Physical Exam Vitals:   09/03/18 1439  BP: 110/80  Pulse: 90  Resp: 17  Temp: 98.1 F (36.7 C)  SpO2: 98%    BP Readings from Last 3 Encounters:  09/03/18 110/80  06/07/18 118/86  12/17/17 114/78   Wt Readings from Last 3 Encounters:  09/03/18 133 lb (60.3 kg)  06/07/18 136 lb (61.7 kg)  12/17/17 133 lb 4 oz (60.4 kg)    Physical Exam Constitutional:      General: She is not in acute distress.    Appearance: She is not diaphoretic.  HENT:     Head: Normocephalic and atraumatic.     Mouth/Throat:     Mouth: Mucous membranes are moist.     Pharynx: Oropharynx is clear.  Eyes:     Conjunctiva/sclera: Conjunctivae normal.     Pupils: Pupils are equal, round, and reactive to light.  Cardiovascular:     Rate and Rhythm: Normal rate and regular rhythm.     Heart sounds: Normal heart sounds.  Pulmonary:     Effort: Pulmonary effort is normal.     Breath sounds: Normal breath sounds.  Abdominal:     General: Bowel sounds are normal. There is no distension.     Palpations: Abdomen is soft.     Tenderness: There is no abdominal tenderness. There is no guarding or rebound.  Genitourinary:    Comments: Chaperone used, bilateral breast with no skin changes, masses, tenderness, or nipple inversion, no axillary masses bilaterally Musculoskeletal:     Right lower leg: No edema.     Left lower leg: No edema.  Skin:    General: Skin is warm and dry.  Neurological:     Mental Status: She is alert.  Psychiatric:        Mood and Affect: Mood normal.      Assessment/Plan:   Routine general medical examination at a health care facility Physical exam completed.  Patient is not due for lab work yet.  She will continue diet and exercise.  She will return in 4  months for follow-up and lab work.  She declined hepatitis C screening, Pneumovax, and Shingrix.  Patient will try to get her tetanus vaccine records from employee health.   No orders of the defined types were placed in this encounter.   No orders of the defined types were placed in this encounter.    Erin Rumps, MD [facility]

## 2018-09-03 NOTE — Patient Instructions (Signed)
Nice to see you. Please continue with diet and exercise. If you decide you want Pneumovax or Shingrix please let us know and we can send these prescriptions to your pharmacy.

## 2018-09-23 ENCOUNTER — Other Ambulatory Visit: Payer: Self-pay

## 2018-09-23 ENCOUNTER — Encounter: Payer: Self-pay | Admitting: Family Medicine

## 2018-09-23 MED ORDER — OMEPRAZOLE 20 MG PO CPDR: DELAYED_RELEASE_CAPSULE | ORAL | 3 refills | Status: DC

## 2018-09-23 MED ORDER — OMEPRAZOLE 20 MG PO CPDR: DELAYED_RELEASE_CAPSULE | ORAL | 2 refills | Status: DC

## 2018-11-11 ENCOUNTER — Encounter: Payer: Self-pay | Admitting: Family Medicine

## 2018-11-11 DIAGNOSIS — E039 Hypothyroidism, unspecified: Secondary | ICD-10-CM

## 2018-11-11 NOTE — Telephone Encounter (Signed)
Sent to PCP to advise 

## 2018-11-11 NOTE — Telephone Encounter (Signed)
I would suggest a virtual visit for this patient. Please get her set up for one on Monday. If her symptoms worsen she should go to an urgent care over the weekend.

## 2018-11-15 ENCOUNTER — Other Ambulatory Visit: Payer: Medicare HMO

## 2018-11-15 NOTE — Telephone Encounter (Signed)
Pt stated that she feels that her symptoms are related to her thyroid and she has had these same symptoms happen in the past when her thyroid was low. Pt would like to be set up to do lab work to check on her thyroid.   Sent to PCP to place lab orders or advise if a visit is needed.   Thanks

## 2018-11-16 ENCOUNTER — Other Ambulatory Visit (INDEPENDENT_AMBULATORY_CARE_PROVIDER_SITE_OTHER): Payer: Medicare HMO

## 2018-11-16 ENCOUNTER — Other Ambulatory Visit: Payer: Self-pay

## 2018-11-16 DIAGNOSIS — E039 Hypothyroidism, unspecified: Secondary | ICD-10-CM | POA: Diagnosis not present

## 2018-11-16 LAB — TSH: TSH: 0.94 u[IU]/mL (ref 0.35–4.50)

## 2018-11-19 ENCOUNTER — Ambulatory Visit (INDEPENDENT_AMBULATORY_CARE_PROVIDER_SITE_OTHER): Payer: Medicare HMO | Admitting: Family Medicine

## 2018-11-19 ENCOUNTER — Encounter: Payer: Self-pay | Admitting: Family Medicine

## 2018-11-19 ENCOUNTER — Other Ambulatory Visit: Payer: Self-pay

## 2018-11-19 DIAGNOSIS — R05 Cough: Secondary | ICD-10-CM | POA: Diagnosis not present

## 2018-11-19 DIAGNOSIS — K219 Gastro-esophageal reflux disease without esophagitis: Secondary | ICD-10-CM

## 2018-11-19 DIAGNOSIS — F4323 Adjustment disorder with mixed anxiety and depressed mood: Secondary | ICD-10-CM | POA: Diagnosis not present

## 2018-11-19 DIAGNOSIS — R69 Illness, unspecified: Secondary | ICD-10-CM | POA: Diagnosis not present

## 2018-11-19 DIAGNOSIS — R059 Cough, unspecified: Secondary | ICD-10-CM

## 2018-11-19 MED ORDER — FLUTICASONE PROPIONATE 50 MCG/ACT NA SUSP: 2.0000 | Freq: Every day | NASAL | 6 refills | Status: DC

## 2018-11-19 MED ORDER — OMEPRAZOLE 40 MG PO CPDR: 40.0000 mg | DELAYED_RELEASE_CAPSULE | Freq: Every day | ORAL | 0 refills | Status: DC

## 2018-11-19 NOTE — Progress Notes (Signed)
[name], MD Phone: [phone]  Erin [name] is a 68 y.o. female who presents today for follow-up.  Anxiety: Patient notes recently she has had a funny feeling in her chest.  Describes it as an anxious feeling.  She notes no chest pain or palpitations.  She notes it typically occurs when she is watching TV and coverage of coronavirus that can occur at other times.  She exercises by walking and lifting weights and has no issues with that and notes that actually helps.  No depression.  She takes amitriptyline.  She notes no dry mouth or constipation with that.  Cough: Patient notes this typically occurs when she mows the yard outside or at night when she is getting in bed.  Notes it does not occur every day.  Notes it lasts very briefly and goes away.  She notes no rhinorrhea, postnasal drip, or congestion.  Cough is nonproductive.  She believes it is allergies.  Does occur around this time every year.  No fever.  No travel.  No COVID-19 exposure.  She does have reflux.  GERD: On omeprazole 20 mg daily.  She takes this about an hour prior to eating.  She does note some reflux symptoms at times.  No blood in her stool.  No abdominal pain.  No trouble swallowing.  Social History   Tobacco Use  Smoking Status Never Smoker  Smokeless Tobacco Never Used     ROS see history of present illness  Objective  Physical Exam Vitals:   11/19/18 0811  BP: 110/80  Pulse: 92  Temp: 98.6 F (37 C)  SpO2: 97%    BP Readings from Last 3 Encounters:  11/19/18 110/80  09/03/18 110/80  06/07/18 118/86   Wt Readings from Last 3 Encounters:  11/19/18 133 lb (60.3 kg)  09/03/18 133 lb (60.3 kg)  06/07/18 136 lb (61.7 kg)    Physical Exam Constitutional:      General: She is not in acute distress.    Appearance: She is not diaphoretic.  HENT:     Head: Normocephalic and atraumatic.     Right Ear: Tympanic membrane normal.     Left Ear: Tympanic membrane normal.     Mouth/Throat:      Mouth: Mucous membranes are moist.     Pharynx: Oropharynx is clear.  Cardiovascular:     Rate and Rhythm: Normal rate and regular rhythm.     Heart sounds: Normal heart sounds.  Pulmonary:     Effort: Pulmonary effort is normal.     Breath sounds: Normal breath sounds.  Abdominal:     General: Bowel sounds are normal. There is no distension.     Palpations: Abdomen is soft.     Tenderness: There is no abdominal tenderness. There is no guarding or rebound.  Lymphadenopathy:     Cervical: No cervical adenopathy.  Skin:    General: Skin is warm and dry.  Neurological:     Mental Status: She is alert.      Assessment/Plan: Please see individual problem list.  GERD (gastroesophageal reflux disease) Patient has had some increased symptoms.  This could be contributing to her mild cough.  We will have her trial omeprazole 40 mg daily for 2 weeks and then return to omeprazole 20 mg daily.  If symptoms are not improving with this she will let us know.  Adjustment disorder with mixed anxiety and depressed mood Symptoms are consistent with anxiety.  No depression.  Discussed option of changing  her amitriptyline to a different medication versus increasing the dose versus monitoring.  She opted for monitoring with continued exercise.  She will keep her follow-up in June for this.  Cough Minimal symptoms.  Seems to be reflux related versus allergy related.  We will trial a higher dose of omeprazole and Flonase.  If not improving over the next 2 weeks she will let us know.  Return precautions given in AVS.   No orders of the defined types were placed in this encounter.   Meds ordered this encounter  Medications  . fluticasone (FLONASE) 50 MCG/ACT nasal spray    Sig: Place 2 sprays into both nostrils daily.    Dispense:  16 g    Refill:  6  . omeprazole (PRILOSEC) 40 MG capsule    Sig: Take 1 capsule (40 mg total) by mouth daily for 14 days.    Dispense:  14 capsule    Refill:  0      [name], MD [place]

## 2018-11-19 NOTE — Assessment & Plan Note (Signed)
Minimal symptoms.  Seems to be reflux related versus allergy related.  We will trial a higher dose of omeprazole and Flonase.  If not improving over the next 2 weeks she will let us know.  Return precautions given in AVS.

## 2018-11-19 NOTE — Patient Instructions (Signed)
Nice to see you. We will trial flonase for your allergy symptoms.  Please try the higher dose of omeprazole for 2 weeks.  You can then return to 20 mg of omeprazole daily.  If your symptoms are not improving please let us know. If you develop fevers, productive cough, shortness of breath, or any worsening symptoms please be evaluated.

## 2018-11-19 NOTE — Assessment & Plan Note (Signed)
Patient has had some increased symptoms.  This could be contributing to her mild cough.  We will have her trial omeprazole 40 mg daily for 2 weeks and then return to omeprazole 20 mg daily.  If symptoms are not improving with this she will let us know.

## 2018-11-19 NOTE — Assessment & Plan Note (Signed)
Symptoms are consistent with anxiety.  No depression.  Discussed option of changing her amitriptyline to a different medication versus increasing the dose versus monitoring.  She opted for monitoring with continued exercise.  She will keep her follow-up in June for this.

## 2018-12-15 ENCOUNTER — Encounter: Payer: Self-pay | Admitting: Family Medicine

## 2018-12-15 ENCOUNTER — Telehealth: Payer: Self-pay | Admitting: Family Medicine

## 2018-12-15 NOTE — Telephone Encounter (Signed)
Copied from [id number]. Topic: Quick Communication - See Telephone Encounter >> Dec 15, 2018  9:41 AM Ivar Drape wrote: CRM for notification. See Telephone encounter for: 12/15/18. Patient would like a refill on her amitriptyline (ELAVIL) 50 MG tablet medication and have it sent to her preferred pharmacy [facility].  Patient only has 3 days of medication left.

## 2018-12-16 MED ORDER — AMITRIPTYLINE HCL 50 MG PO TABS: ORAL_TABLET | ORAL | 1 refills | Status: DC

## 2018-12-17 MED ORDER — AMITRIPTYLINE HCL 50 MG PO TABS: ORAL_TABLET | ORAL | 1 refills | Status: DC

## 2019-01-05 ENCOUNTER — Encounter: Payer: Self-pay | Admitting: Family Medicine

## 2019-01-05 ENCOUNTER — Ambulatory Visit (INDEPENDENT_AMBULATORY_CARE_PROVIDER_SITE_OTHER): Payer: Medicare HMO | Admitting: Family Medicine

## 2019-01-05 ENCOUNTER — Other Ambulatory Visit: Payer: Self-pay

## 2019-01-05 DIAGNOSIS — K219 Gastro-esophageal reflux disease without esophagitis: Secondary | ICD-10-CM | POA: Diagnosis not present

## 2019-01-05 DIAGNOSIS — E785 Hyperlipidemia, unspecified: Secondary | ICD-10-CM | POA: Diagnosis not present

## 2019-01-05 DIAGNOSIS — E039 Hypothyroidism, unspecified: Secondary | ICD-10-CM | POA: Diagnosis not present

## 2019-01-05 DIAGNOSIS — F4323 Adjustment disorder with mixed anxiety and depressed mood: Secondary | ICD-10-CM

## 2019-01-05 DIAGNOSIS — E041 Nontoxic single thyroid nodule: Secondary | ICD-10-CM | POA: Diagnosis not present

## 2019-01-05 DIAGNOSIS — R05 Cough: Secondary | ICD-10-CM | POA: Diagnosis not present

## 2019-01-05 DIAGNOSIS — R69 Illness, unspecified: Secondary | ICD-10-CM | POA: Diagnosis not present

## 2019-01-05 DIAGNOSIS — R059 Cough, unspecified: Secondary | ICD-10-CM

## 2019-01-05 MED ORDER — LEVOTHYROXINE SODIUM 75 MCG PO TABS: ORAL_TABLET | ORAL | 3 refills | Status: DC

## 2019-01-05 NOTE — Assessment & Plan Note (Signed)
Resolved.  Likely related to allergies.  She will monitor for recurrence and can use Flonase as needed.

## 2019-01-05 NOTE — Assessment & Plan Note (Signed)
Asymptomatic.  Continue omeprazole 20 mg daily.  Discussed risk of fracture, increased risk of infection, and risk of kidney disease with long-term use of omeprazole.

## 2019-01-05 NOTE — Assessment & Plan Note (Signed)
Asymptomatic.  Continue amitriptyline and exercise.

## 2019-01-05 NOTE — Progress Notes (Signed)
Virtual Visit via video Note  This visit type was conducted due to national recommendations for restrictions regarding the COVID-19 pandemic (e.g. social distancing).  This format is felt to be most appropriate for this patient at this time.  All issues noted in this document were discussed and addressed.  No physical exam was performed (except for noted visual exam findings with Video Visits).   I connected with [name] today at  8:00 AM EDT by a video enabled telemedicine application and verified that I am speaking with the correct person using two identifiers. Location patient: home Location provider: work Persons participating in the virtual visit: patient, provider  I discussed the limitations, risks, security and privacy concerns of performing an evaluation and management service by telephone and the availability of in person appointments. I also discussed with the patient that there may be a patient responsible charge related to this service. The patient expressed understanding and agreed to proceed.  Reason for visit: follow-up  HPI: GERD:   Reflux symptoms: none   Abd pain: no   Blood in stool: no  Dysphagia: no   EGD: in 2007, had esophagitis and gastritis, no recurrent issues with this. Medication: Taking omeprazole 20 mg daily.  She did try 40 mg daily as she had some cough previously though this was not overly beneficial and she went back to 20 mg daily.  Cough: Patient notes this resolved.  It seemed to be related to allergies.  She used the [facility] for a couple of weeks and that resolved the symptoms.  She has had no recurrence.  Anxiety: Patient notes this is quite a bit better.  Not having any significant issues with this.  She continues on amitriptyline.  She started exercising more with weights and walking and that has been beneficial.  No depression.  Thyroid nodule: She is following with endocrinology for this.  She has follow-up with them next month.    ROS:  See pertinent positives and negatives per HPI.  Past Medical History:  Diagnosis Date  . Cancer [facility]) 2011   thyroid, Dr. Carlis Abbott  . Hyperlipidemia    Hx  . Thyroid disease     Past Surgical History:  Procedure Laterality Date  . CHOLECYSTECTOMY    . COLONOSCOPY WITH PROPOFOL N/A 12/23/2016   Procedure: COLONOSCOPY WITH PROPOFOL;  Surgeon: Lollie Sails, MD;  Location: [facility] ENDOSCOPY;  Service: Endoscopy;  Laterality: N/A;  . GALLBLADDER SURGERY  2000  . KNEE SURGERY  2003/ 2010   right knee x2 Dr. Marry Guan for torn meniscus  . [place]   right, arthritis  . THYROIDECTOMY  2011   right side    Family History  Problem Relation Age of Onset  . Stroke Mother   . Heart disease Father   . Heart attack Father   . Cancer Maternal Grandmother        pancreatic  . Breast cancer Neg Hx     SOCIAL HX: Non-smoker.   Current Outpatient Medications:  .  amitriptyline (ELAVIL) 50 MG tablet, TAKE 1 TABLET BY MOUTH EVERYDAY AT BEDTIME, Disp: 90 tablet, Rfl: 1 .  Calcium Carb-Cholecalciferol (CALCIUM 1000 + D PO), Take by mouth., Disp: , Rfl:  .  cholecalciferol (VITAMIN D) 1000 UNITS tablet, Take 1,000 Units by mouth daily., Disp: , Rfl:  .  Cinnamon 500 MG capsule, Take 500 mg by mouth daily., Disp: , Rfl:  .  fluticasone (FLONASE) 50 MCG/ACT nasal spray, Place 2 sprays into  both nostrils daily., Disp: 16 g, Rfl: 6 .  levothyroxine (SYNTHROID) 75 MCG tablet, TAKE 1 TABLET (75 MCG TOTAL) BY MOUTH DAILY BEFORE BREAKFAST., Disp: 90 tablet, Rfl: 3 .  Multiple Vitamins-Minerals (CENTRUM SILVER PO), Take by mouth., Disp: , Rfl:  .  NON FORMULARY, goli gummies - apple cider gummies takes 2-3 gummies once daily, Disp: , Rfl:  .  omeprazole (PRILOSEC) 20 MG capsule, TAKE 1 CAPSULE BY MOUTH EVERY DAY, Disp: 90 capsule, Rfl: 2 .  scopolamine (TRANSDERM-SCOP, 1.5 MG,) 1 MG/3DAYS, Place 1 patch (1.5 mg total) onto the skin every 3 (three) days., Disp: 4 patch, Rfl: 0  EXAM:   VITALS per patient if applicable: None.  GENERAL: alert, oriented, appears well and in no acute distress  HEENT: atraumatic, conjunttiva clear, no obvious abnormalities on inspection of external nose and ears  NECK: normal movements of the head and neck  LUNGS: on inspection no signs of respiratory distress, breathing rate appears normal, no obvious gross SOB, gasping or wheezing  CV: no obvious cyanosis  MS: moves all visible extremities without noticeable abnormality  PSYCH/NEURO: pleasant and cooperative, no obvious depression or anxiety, speech and thought processing grossly intact  ASSESSMENT AND PLAN:  Discussed the following assessment and plan:  Hyperlipidemia, unspecified hyperlipidemia type - Plan: Lipid panel, Comp Met (CMET)  Hypothyroidism, unspecified type - Plan: levothyroxine (SYNTHROID) 75 MCG tablet  Gastroesophageal reflux disease, esophagitis presence not specified  Adjustment disorder with mixed anxiety and depressed mood  Cough  Thyroid nodule  GERD (gastroesophageal reflux disease) Asymptomatic.  Continue omeprazole 20 mg daily.  Discussed risk of fracture, increased risk of infection, and risk of kidney disease with long-term use of omeprazole.  Adjustment disorder with mixed anxiety and depressed mood Asymptomatic.  Continue amitriptyline and exercise.  Cough Resolved.  Likely related to allergies.  She will monitor for recurrence and can use Flonase as needed.  Hyperlipidemia Due for lipid panel.  She will come in in 2 months for this.  Thyroid nodule Patient reports she has a thyroid nodule that is being followed by endocrinology.  She will continue to follow with them.  [place] office staff will contact the patient to schedule follow-up in 6 months and lab work in 2 months.  Social distancing precautions and sick precautions given regarding COVID-19.   I discussed the assessment and treatment plan with the patient. The patient was provided  an opportunity to ask questions and all were answered. The patient agreed with the plan and demonstrated an understanding of the instructions.   The patient was advised to call back or seek an in-person evaluation if the symptoms worsen or if the condition fails to improve as anticipated.   Erin Rumps, MD

## 2019-01-05 NOTE — Assessment & Plan Note (Signed)
Due for lipid panel.  She will come in in 2 months for this.

## 2019-01-05 NOTE — Assessment & Plan Note (Signed)
Patient reports she has a thyroid nodule that is being followed by endocrinology.  She will continue to follow with them.

## 2019-02-08 DIAGNOSIS — E890 Postprocedural hypothyroidism: Secondary | ICD-10-CM | POA: Diagnosis not present

## 2019-02-08 DIAGNOSIS — C73 Malignant neoplasm of thyroid gland: Secondary | ICD-10-CM | POA: Diagnosis not present

## 2019-02-08 DIAGNOSIS — E063 Autoimmune thyroiditis: Secondary | ICD-10-CM | POA: Diagnosis not present

## 2019-02-08 DIAGNOSIS — K229 Disease of esophagus, unspecified: Secondary | ICD-10-CM | POA: Diagnosis not present

## 2019-02-08 DIAGNOSIS — E785 Hyperlipidemia, unspecified: Secondary | ICD-10-CM | POA: Diagnosis not present

## 2019-02-16 DIAGNOSIS — K229 Disease of esophagus, unspecified: Secondary | ICD-10-CM | POA: Diagnosis not present

## 2019-02-16 DIAGNOSIS — E063 Autoimmune thyroiditis: Secondary | ICD-10-CM | POA: Diagnosis not present

## 2019-02-16 DIAGNOSIS — E890 Postprocedural hypothyroidism: Secondary | ICD-10-CM | POA: Diagnosis not present

## 2019-02-16 DIAGNOSIS — C73 Malignant neoplasm of thyroid gland: Secondary | ICD-10-CM | POA: Diagnosis not present

## 2019-02-16 DIAGNOSIS — E785 Hyperlipidemia, unspecified: Secondary | ICD-10-CM | POA: Diagnosis not present

## 2019-03-04 ENCOUNTER — Ambulatory Visit (INDEPENDENT_AMBULATORY_CARE_PROVIDER_SITE_OTHER): Payer: Medicare HMO

## 2019-03-04 DIAGNOSIS — Z0000 Encounter for general adult medical examination without abnormal findings: Secondary | ICD-10-CM

## 2019-03-04 NOTE — Patient Instructions (Addendum)
  Erin [name] , Thank you for taking time to come for your Medicare Wellness Visit. I appreciate your ongoing commitment to your health goals. Please review the following plan we discussed and let me know if I can assist you in the future.   These are the goals we discussed: Goals      Patient Stated   . Follow up with Primary Care Provider (pt-stated)     Follow up as needed       This is a list of the screening recommended for you and due dates:  Health Maintenance  Topic Date Due  . Tetanus Vaccine  12/05/2015  .  Hepatitis C: One time screening is recommended by Center for Disease Control  (CDC) for  adults born from [birth year] through 1964.   09/04/2019*  . Pneumonia vaccines (1 of 2 - PCV13) 09/04/2019*  . Flu Shot  03/05/2019  . Mammogram  06/23/2019  . Pap Smear  11/18/2019  . Colon Cancer Screening  12/23/2021  . DEXA scan (bone density measurement)  Completed  *Topic was postponed. The date shown is not the original due date.

## 2019-03-04 NOTE — Progress Notes (Signed)
Subjective:   Erin [name] is a 68 y.o. female who presents for an Initial Medicare Annual Wellness Visit.  Review of Systems    No ROS.  Medicare Wellness Virtual Visit.  Visual/audio telehealth visit, UTA vital signs.   See social history for additional risk factors.    Cardiac Risk Factors include: advanced age (>31men, >76 women)     Objective:    Today's Vitals   There is no height or weight on file to calculate BMI.  Advanced Directives 03/04/2019 12/23/2016  Does Patient Have a Medical Advance Directive? Yes Yes  Type of Paramedic of [place];Living will -  Does patient want to make changes to medical advance directive? No - Patient declined -  Copy of [place] in Chart? No - copy requested -    Current Medications (verified) Outpatient Encounter Medications as of 03/04/2019  Medication Sig  . amitriptyline (ELAVIL) 50 MG tablet TAKE 1 TABLET BY MOUTH EVERYDAY AT BEDTIME  . Calcium Carb-Cholecalciferol (CALCIUM 1000 + D PO) Take by mouth.  . cholecalciferol (VITAMIN D) 1000 UNITS tablet Take 1,000 Units by mouth daily.  . fluticasone (FLONASE) 50 MCG/ACT nasal spray Place 2 sprays into both nostrils daily.  Marland Kitchen levothyroxine (SYNTHROID) 75 MCG tablet TAKE 1 TABLET (75 MCG TOTAL) BY MOUTH DAILY BEFORE BREAKFAST.  . Multiple Vitamins-Minerals (CENTRUM SILVER PO) Take by mouth.  . NON FORMULARY goli gummies - apple cider gummies takes 2-3 gummies once daily  . omeprazole (PRILOSEC) 20 MG capsule TAKE 1 CAPSULE BY MOUTH EVERY DAY  . scopolamine (TRANSDERM-SCOP, 1.5 MG,) 1 MG/3DAYS Place 1 patch (1.5 mg total) onto the skin every 3 (three) days. (Patient not taking: Reported on 03/04/2019)  . [DISCONTINUED] Cinnamon 500 MG capsule Take 500 mg by mouth daily.   No facility-administered encounter medications on file as of 03/04/2019.     Allergies (verified) Amoxicillin, Atorvastatin, and Codeine   History: Past Medical  History:  Diagnosis Date  . 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 Carlis Abbott  . Hyperlipidemia    Hx  . Thyroid disease    Past Surgical History:  Procedure Laterality Date  . CHOLECYSTECTOMY    . COLONOSCOPY WITH PROPOFOL N/A 12/23/2016   Procedure: COLONOSCOPY WITH PROPOFOL;  Surgeon: Lollie Sails, MD;  Location: [facility] ENDOSCOPY;  Service: Endoscopy;  Laterality: N/A;  . GALLBLADDER SURGERY  2000  . KNEE SURGERY  2003/ 2010   right knee x2 Dr. Marry Guan for torn meniscus  . [place]   right, arthritis  . THYROIDECTOMY  2011   right side   Family History  Problem Relation Age of Onset  . Stroke Mother   . Heart disease Father   . Heart attack Father   . Cancer Maternal Grandmother        pancreatic  . Breast cancer Neg Hx    Social History   Socioeconomic History  . Marital status: Single    Spouse name: Not on file  . Number of children: Not on file  . Years of education: Not on file  . Highest education level: Not on file  Occupational History  . Not on file  Social Needs  . Financial resource strain: Not hard at all  . Food insecurity    Worry: Never true    Inability: Never true  . Transportation needs    Medical: No    Non-medical: No  Tobacco Use  . Smoking status: Never Smoker  .  Smokeless tobacco: Never Used  Substance and Sexual Activity  . Alcohol use: Yes    Alcohol/week: 0.0 standard drinks    Comment: socially  . Drug use: No  . Sexual activity: Never    Partners: Female  Lifestyle  . Physical activity    Days per week: 7 days    Minutes per session: 60 min  . Stress: Not at all  Relationships  . Social Herbalist on phone: Not on file    Gets together: Not on file    Attends religious service: Not on file    Active member of club or organization: Not on file    Attends meetings of clubs or organizations: Not on file    Relationship status: Not on file  Other Topics Concern  . Not on file  Social History Narrative    Lives in [place].      Work - [employer]    Tobacco Counseling Counseling given: Not Answered   Clinical Intake:  Pre-visit preparation completed: Yes        Diabetes: No  How often do you need to have someone help you when you read instructions, pamphlets, or other written materials from your doctor or pharmacy?: 1 - Never  Interpreter Needed?: No      Activities of Daily Living In your present state of health, do you have any difficulty performing the following activities: 03/04/2019  Hearing? N  Vision? N  Difficulty concentrating or making decisions? N  Walking or climbing stairs? N  Dressing or bathing? N  Doing errands, shopping? N  Preparing Food and eating ? N  Using the Toilet? N  In the past six months, have you accidently leaked urine? N  Do you have problems with loss of bowel control? N  Managing your Medications? N  Managing your Finances? N  Housekeeping or managing your Housekeeping? N  Some recent data might be hidden     Immunizations and Health Maintenance Immunization History  Administered Date(s) Administered  . Influenza, High Dose Seasonal PF 05/28/2017, 06/07/2018  . Influenza-Unspecified 04/20/2013, 05/28/2015  . Tdap 12/04/2005   Health Maintenance Due  Topic Date Due  . Samul Dada  12/05/2015    Patient Care Team: Leone Haven, MD as PCP - General (Family Medicine)  Indicate any recent Medical Services you may have received from other than [facility] providers in the past year (date may be approximate).     Assessment:   This is a routine wellness examination for [place].  I connected with patient 03/04/19 at  9:00 AM EDT by a video/audio enabled telemedicine application and verified that I am speaking with the correct person using two identifiers. Patient stated full name and DOB. Patient gave permission to continue with virtual visit. Patient's location was at home and Nurse's location was at [place] office.   Health  Screenings  Mammogram 3D- 06/2018 Colonoscopy - 12/2016 Bone Density - 09/2016 Glaucoma -none Hearing -demonstrates normal hearing during visit. TSH- 11/2018 Cholesterol - 11/2017 Dental- visits every 6 months Vision- visits within the last 12 months.   Social  Alcohol intake - yes      Smoking history- never    Smokers in home? none Illicit drug use? none Exercise - walking daily, 60 minutes Diet - regular Sexually Active -not currently BMI- discussed the importance of a healthy diet, water intake and the benefits of aerobic exercise.  Educational material provided.   Safety  Patient feels safe at home- yes Patient  does have smoke detectors at home- yes Patient does wear sunscreen or protective clothing when in direct sunlight -yes Patient does wear seat belt when in a moving vehicle -yes Patient drives- yes  NIOEV-03 precautions and sickness symptoms discussed.   Activities of Daily Living Patient denies needing assistance with: driving, household chores, feeding themselves, getting from bed to chair, getting to the toilet, bathing/showering, dressing, managing money, or preparing meals.  No new identified risk were noted.    Depression Screen Patient denies losing interest in daily life, feeling hopeless, or crying easily over simple problems.   Medication-taking as directed and without issues.   Fall Screen Patient denies being afraid of falling or falling in the last year.   Memory Screen Patient is alert.  Patient denies difficulty focusing, concentrating or misplacing items. Correctly identified the president of the [place], season and recall. Patient likes to read and play ipad games for brain stimulation.  Immunizations The following Immunizations were discussed: Influenza, shingles, pneumonia, and tetanus.   Other Providers Patient Care Team: Leone Haven, MD as PCP - General (Family Medicine)  Hearing/Vision screen  Hearing Screening   125Hz  250Hz   500Hz  1000Hz  2000Hz  3000Hz  4000Hz  6000Hz  8000Hz   Right ear:           Left ear:           Comments: Patient is able to hear conversational tones without difficulty.  No issues reported.    Vision Screening Comments: Cataract extraction, bilateral Visual acuity not assessed, virtual visit.  They have seen their ophthalmologist in the last 12 months.     Dietary issues and exercise activities discussed: Current Exercise Habits: Home exercise routine, Type of exercise: walking, Time (Minutes): 60, Frequency (Times/Week): 7, Weekly Exercise (Minutes/Week): 420, Intensity: Moderate  Goals      Patient Stated   . Follow up with Primary Care Provider (pt-stated)     Follow up as needed      Depression Screen PHQ 2/9 Scores 03/04/2019 05/28/2017  PHQ - 2 Score 0 0    Fall Risk Fall Risk  03/04/2019 05/28/2017  Falls in the past year? 0 No  Is the patient's home free of loose throw rugs in walkways, pet beds, electrical cords, etc? yes      Grab bars in the bathroom? yes      Handrails on the stairs? yes      Adequate lighting? yes   Cognitive Function:     6CIT Screen 03/04/2019  What Year? 0 points  What month? 0 points  What time? 0 points  Count back from 20 0 points  Months in reverse 0 points  Repeat phrase 0 points  Total Score 0    Screening Tests Health Maintenance  Topic Date Due  . TETANUS/TDAP  12/05/2015  . Hepatitis C Screening  09/04/2019 (Originally 06-23-1951)  . PNA vac Low Risk Adult (1 of 2 - PCV13) 09/04/2019 (Originally 04/06/2017)  . INFLUENZA VACCINE  03/05/2019  . MAMMOGRAM  06/23/2019  . PAP SMEAR-Modifier  11/18/2019  . COLONOSCOPY  12/23/2021  . DEXA SCAN  Completed     Plan:    End of life planning; Advance aging; Advanced directives discussed.  Copy of current HCPOA/Living Will requested.    I have personally reviewed and noted the following in the patient's chart:   . Medical and social history . Use of alcohol, tobacco or illicit  drugs  . Current medications and supplements . Functional ability and status . Nutritional status .  Physical activity . Advanced directives . List of other physicians . Hospitalizations, surgeries, and ER visits in previous 12 months . Vitals . Screenings to include cognitive, depression, and falls . Referrals and appointments  In addition, I have reviewed and discussed with patient certain preventive protocols, quality metrics, and best practice recommendations. A written personalized care plan for preventive services as well as general preventive health recommendations were provided to patient.     Varney Biles, LPN   3/52/4818

## 2019-03-04 NOTE — Progress Notes (Signed)
I have reviewed the above note and agree.  Jadin Creque, M.D.  

## 2019-05-02 ENCOUNTER — Encounter: Payer: Self-pay | Admitting: Family Medicine

## 2019-05-12 ENCOUNTER — Ambulatory Visit (INDEPENDENT_AMBULATORY_CARE_PROVIDER_SITE_OTHER): Payer: Medicare HMO

## 2019-05-12 ENCOUNTER — Other Ambulatory Visit: Payer: Self-pay

## 2019-05-12 DIAGNOSIS — Z23 Encounter for immunization: Secondary | ICD-10-CM | POA: Diagnosis not present

## 2019-05-20 ENCOUNTER — Ambulatory Visit: Payer: Medicare HMO

## 2019-06-07 ENCOUNTER — Other Ambulatory Visit: Payer: Self-pay | Admitting: Family Medicine

## 2019-06-07 DIAGNOSIS — Z1231 Encounter for screening mammogram for malignant neoplasm of breast: Secondary | ICD-10-CM

## 2019-06-27 ENCOUNTER — Other Ambulatory Visit: Payer: Self-pay

## 2019-06-27 ENCOUNTER — Ambulatory Visit
Admission: RE | Admit: 2019-06-27 | Discharge: 2019-06-27 | Disposition: A | Discharge status: Home / Self Care | Payer: Medicare HMO | Source: Ambulatory Visit | Attending: Family Medicine | Admitting: Family Medicine

## 2019-06-27 DIAGNOSIS — Z1231 Encounter for screening mammogram for malignant neoplasm of breast: Secondary | ICD-10-CM | POA: Diagnosis not present

## 2019-07-01 ENCOUNTER — Encounter: Payer: Self-pay | Admitting: Family Medicine

## 2019-07-02 ENCOUNTER — Encounter: Payer: Self-pay | Admitting: Family Medicine

## 2019-07-04 ENCOUNTER — Telehealth: Payer: Self-pay | Admitting: *Deleted

## 2019-07-04 NOTE — Telephone Encounter (Signed)
See my chart message

## 2019-07-04 NOTE — Telephone Encounter (Signed)
Copied from [place] [id number]. Topic: General - Other >> Jul 04, 2019  8:47 AM Keene Breath wrote: Reason for CRM: Patient called to see if she could be worked in to see the doctor today.  Called office and was told that they will call the patient back to see if she could be worked in today.  CB# [phone]

## 2019-07-04 NOTE — Telephone Encounter (Signed)
Patient says her BP has been elevated several days 146/102 does not know pulse rate , one episode of dizziness, denies headache says ashe just does not feel good. Patient says he went to urgent care on Friday BP was 138/96, I have scheduled virtual for Wednesday morning .

## 2019-07-04 NOTE — Telephone Encounter (Signed)
See other mychart message.

## 2019-07-06 ENCOUNTER — Ambulatory Visit (INDEPENDENT_AMBULATORY_CARE_PROVIDER_SITE_OTHER): Payer: Medicare HMO | Admitting: Family Medicine

## 2019-07-06 ENCOUNTER — Encounter: Payer: Self-pay | Admitting: Family Medicine

## 2019-07-06 ENCOUNTER — Other Ambulatory Visit: Payer: Self-pay

## 2019-07-06 ENCOUNTER — Other Ambulatory Visit
Admission: RE | Admit: 2019-07-06 | Discharge: 2019-07-06 | Disposition: A | Discharge status: Home / Self Care | Payer: Medicare HMO | Source: Ambulatory Visit | Attending: Family Medicine | Admitting: Family Medicine

## 2019-07-06 VITALS — Ht 63.0 in | Wt 128.0 lb

## 2019-07-06 DIAGNOSIS — R002 Palpitations: Secondary | ICD-10-CM

## 2019-07-06 DIAGNOSIS — R030 Elevated blood-pressure reading, without diagnosis of hypertension: Secondary | ICD-10-CM | POA: Diagnosis not present

## 2019-07-06 DIAGNOSIS — E782 Mixed hyperlipidemia: Secondary | ICD-10-CM | POA: Insufficient documentation

## 2019-07-06 DIAGNOSIS — E039 Hypothyroidism, unspecified: Secondary | ICD-10-CM | POA: Insufficient documentation

## 2019-07-06 DIAGNOSIS — I10 Essential (primary) hypertension: Secondary | ICD-10-CM | POA: Insufficient documentation

## 2019-07-06 LAB — COMPREHENSIVE METABOLIC PANEL
ALT: 29 U/L (ref 0–44)
AST: 31 U/L (ref 15–41)
Albumin: 4.4 g/dL (ref 3.5–5.0)
Alkaline Phosphatase: 57 U/L (ref 38–126)
Anion gap: 8 (ref 5–15)
BUN: 16 mg/dL (ref 8–23)
CO2: 28 mmol/L (ref 22–32)
Calcium: 9.4 mg/dL (ref 8.9–10.3)
Chloride: 103 mmol/L (ref 98–111)
Creatinine, Ser: 0.8 mg/dL (ref 0.44–1.00)
GFR calc Af Amer: >60 mL/min (ref >60)
GFR calc non Af Amer: >60 mL/min (ref >60)
Glucose, Bld: 145 mg/dL — ABNORMAL HIGH (ref 70–99)
Potassium: 3.8 mmol/L (ref 3.5–5.1)
Sodium: 139 mmol/L (ref 135–145)
Total Bilirubin: 0.6 mg/dL (ref 0.3–1.2)
Total Protein: 7.4 g/dL (ref 6.5–8.1)

## 2019-07-06 LAB — CBC
HCT: 45.2 % (ref 36.0–46.0)
Hemoglobin: 15.1 g/dL — ABNORMAL HIGH (ref 12.0–15.0)
MCH: 30.8 pg (ref 26.0–34.0)
MCHC: 33.4 g/dL (ref 30.0–36.0)
MCV: 92.2 fL (ref 80.0–100.0)
Platelets: 295 10*3/uL (ref 150–400)
RBC: 4.9 MIL/uL (ref 3.87–5.11)
RDW: 13 % (ref 11.5–15.5)
WBC: 9.8 10*3/uL (ref 4.0–10.5)
nRBC: 0 % (ref 0.0–0.2)

## 2019-07-06 LAB — LIPID PANEL
Cholesterol: 242 mg/dL — ABNORMAL HIGH (ref 0–200)
HDL: 44 mg/dL (ref >40)
LDL Cholesterol: 160 mg/dL — ABNORMAL HIGH (ref 0–99)
Total CHOL/HDL Ratio: 5.5 RATIO
Triglycerides: 192 mg/dL — ABNORMAL HIGH (ref <150)
VLDL: 38 mg/dL (ref 0–40)

## 2019-07-06 LAB — TSH: TSH: 1.991 u[IU]/mL (ref 0.350–4.500)

## 2019-07-06 NOTE — Assessment & Plan Note (Signed)
Check TSH.  In the past when her TSH has been off she has felt poorly similar to now.  Consider altering dose if TSH is abnormal.

## 2019-07-06 NOTE — Assessment & Plan Note (Signed)
Check lipid panel  

## 2019-07-06 NOTE — Progress Notes (Signed)
Virtual Visit via telephone Note  This visit type was conducted due to national recommendations for restrictions regarding the COVID-19 pandemic (e.g. social distancing).  This format is felt to be most appropriate for this patient at this time.  All issues noted in this document were discussed and addressed.  No physical exam was performed (except for noted visual exam findings with Video Visits).   I connected with Erin [name] today at  8:00 AM EST by telephone and verified that I am speaking with the correct person using two identifiers. Location patient: home Location provider: work  Persons participating in the virtual visit: patient, provider  I discussed the limitations, risks, security and privacy concerns of performing an evaluation and management service by telephone and the availability of in person appointments. I also discussed with the patient that there may be a patient responsible charge related to this service. The patient expressed understanding and agreed to proceed.  Interactive audio and video telecommunications were attempted between this provider and patient, however failed, due to patient having technical difficulties OR patient did not have access to video capability.  We continued and completed visit with audio only.   Reason for visit: same day visit  HPI: Elevated blood pressure: Patient notes last week she started to not feel good and felt a little achy in her shoulders and neck and had a headache.  She subsequently checked her blood pressure and it was 146/102.  She had it checked on several other occasions and it was in the upper 130s over 90s.  She notes the achiness and headache went away and she is feeling better now though she has not checked her blood pressure last several days.  No chest pain, shortness of breath, or edema.  No cough, congestion, fevers, COVID-19 exposure.  She notes she always wears a mask when she is out.  She notes she has felt this way  previously when her thyroid function was off.  She additionally notes some heart racing at times when she goes to bed that typically occurs after having had a couple of beers.   ROS: See pertinent positives and negatives per HPI.  Past Medical History:  Diagnosis Date   Cancer [facility]) 2011   thyroid, Dr. Carlis Abbott   Hyperlipidemia    Hx   Thyroid disease     Past Surgical History:  Procedure Laterality Date   CHOLECYSTECTOMY     COLONOSCOPY WITH PROPOFOL N/A 12/23/2016   Procedure: COLONOSCOPY WITH PROPOFOL;  Surgeon: Lollie Sails, MD;  Location: [facility] ENDOSCOPY;  Service: Endoscopy;  Laterality: N/A;   GALLBLADDER SURGERY  2000   KNEE SURGERY  2003/ 2010   right knee x2 Dr. Marry Guan for torn meniscus   [place]   right, arthritis   THYROIDECTOMY  2011   right side    Family History  Problem Relation Age of Onset   Stroke Mother    Heart disease Father    Heart attack Father    Cancer Maternal Grandmother        pancreatic   Breast cancer Neg Hx     SOCIAL HX: Non-smoker.   Current Outpatient Medications:    amitriptyline (ELAVIL) 50 MG tablet, TAKE 1 TABLET BY MOUTH EVERYDAY AT BEDTIME, Disp: 90 tablet, Rfl: 1   Calcium Carb-Cholecalciferol (CALCIUM 1000 + D PO), Take by mouth., Disp: , Rfl:    cholecalciferol (VITAMIN D) 1000 UNITS tablet, Take 1,000 Units by mouth daily., Disp: , Rfl:  fluticasone (FLONASE) 50 MCG/ACT nasal spray, Place 2 sprays into both nostrils daily., Disp: 16 g, Rfl: 6   levothyroxine (SYNTHROID) 75 MCG tablet, TAKE 1 TABLET (75 MCG TOTAL) BY MOUTH DAILY BEFORE BREAKFAST., Disp: 90 tablet, Rfl: 3   Multiple Vitamins-Minerals (CENTRUM SILVER PO), Take by mouth., Disp: , Rfl:    NON FORMULARY, goli gummies - apple cider gummies takes 2-3 gummies once daily, Disp: , Rfl:    omeprazole (PRILOSEC) 20 MG capsule, TAKE 1 CAPSULE BY MOUTH EVERY DAY, Disp: 90 capsule, Rfl: 2   scopolamine (TRANSDERM-SCOP, 1.5 MG,) 1  MG/3DAYS, Place 1 patch (1.5 mg total) onto the skin every 3 (three) days. (Patient not taking: Reported on 03/04/2019), Disp: 4 patch, Rfl: 0  EXAM: This is a telehealth telephone visit and thus no physical exam was completed.  ASSESSMENT AND PLAN:  Discussed the following assessment and plan:  Hypothyroidism Check TSH.  In the past when her TSH has been off she has felt poorly similar to now.  Consider altering dose if TSH is abnormal.  Hyperlipidemia Check lipid panel.  Elevated blood pressure reading Elevated on several occasions at home.  Potentially could be related to the thyroid abnormality with too high dose of Synthroid.  We will check lab work.  Discussed checking her blood pressure today and sending me the reading.  If it remains elevated we will need to start her on medication.  Palpitations Seems to be related to alcohol intake.  Discussed decreasing to 1 beer or less when she does drink and monitoring her symptoms.  We will check lab work to evaluate for other potential causes.  If it is a persistent issue she will need to see cardiology.    I discussed the assessment and treatment plan with the patient. The patient was provided an opportunity to ask questions and all were answered. The patient agreed with the plan and demonstrated an understanding of the instructions.   The patient was advised to call back or seek an in-person evaluation if the symptoms worsen or if the condition fails to improve as anticipated.  I provided 12 minutes of non-face-to-face time during this encounter.   [name], MD

## 2019-07-06 NOTE — Assessment & Plan Note (Signed)
Seems to be related to alcohol intake.  Discussed decreasing to 1 beer or less when she does drink and monitoring her symptoms.  We will check lab work to evaluate for other potential causes.  If it is a persistent issue she will need to see cardiology.

## 2019-07-06 NOTE — Assessment & Plan Note (Signed)
Elevated on several occasions at home.  Potentially could be related to the thyroid abnormality with too high dose of Synthroid.  We will check lab work.  Discussed checking her blood pressure today and sending me the reading.  If it remains elevated we will need to start her on medication.

## 2019-07-07 ENCOUNTER — Encounter: Payer: Self-pay | Admitting: Family Medicine

## 2019-07-14 ENCOUNTER — Encounter: Payer: Self-pay | Admitting: Family Medicine

## 2019-07-14 NOTE — Telephone Encounter (Signed)
Pt could not access her mychart. Pt of advised of Dr. Ellen Henri note and the Pt stated she is ok with the low dose of amlodipine being sent to the pharmacy / Please advise Pt when this is sent

## 2019-07-15 ENCOUNTER — Telehealth: Payer: Self-pay

## 2019-07-15 MED ORDER — AMLODIPINE BESYLATE 5 MG PO TABS: 5.0000 mg | ORAL_TABLET | Freq: Every day | ORAL | 3 refills | Status: DC

## 2019-07-15 NOTE — Telephone Encounter (Signed)
I called and informed the patient that the RX for her BP medication was sent to the pharamcy.  Jermy Couper,cma

## 2019-07-15 NOTE — Telephone Encounter (Signed)
Copied from [place] [id number]. Topic: General - Other >> Jul 15, 2019 12:09 PM Oneta Rack wrote: Reason for CRM: patient requesting to speak with a nurse today regarding her BP medication, patient was upset she has not received a call, please advise  Advised the patient nurse forwarded My Chart message to PCP awaiting a response

## 2019-07-19 ENCOUNTER — Encounter: Payer: Self-pay | Admitting: Family Medicine

## 2019-07-25 ENCOUNTER — Encounter: Payer: Self-pay | Admitting: Family Medicine

## 2019-08-09 DIAGNOSIS — Z20822 Contact with and (suspected) exposure to covid-19: Secondary | ICD-10-CM | POA: Diagnosis not present

## 2019-08-18 DIAGNOSIS — E890 Postprocedural hypothyroidism: Secondary | ICD-10-CM | POA: Diagnosis not present

## 2019-08-18 DIAGNOSIS — K229 Disease of esophagus, unspecified: Secondary | ICD-10-CM | POA: Diagnosis not present

## 2019-08-18 DIAGNOSIS — C73 Malignant neoplasm of thyroid gland: Secondary | ICD-10-CM | POA: Diagnosis not present

## 2019-08-18 DIAGNOSIS — E785 Hyperlipidemia, unspecified: Secondary | ICD-10-CM | POA: Diagnosis not present

## 2019-08-18 DIAGNOSIS — E063 Autoimmune thyroiditis: Secondary | ICD-10-CM | POA: Diagnosis not present

## 2019-08-21 ENCOUNTER — Other Ambulatory Visit: Payer: Self-pay | Admitting: Family Medicine

## 2019-08-25 DIAGNOSIS — E890 Postprocedural hypothyroidism: Secondary | ICD-10-CM | POA: Diagnosis not present

## 2019-08-25 DIAGNOSIS — K229 Disease of esophagus, unspecified: Secondary | ICD-10-CM | POA: Diagnosis not present

## 2019-08-25 DIAGNOSIS — E063 Autoimmune thyroiditis: Secondary | ICD-10-CM | POA: Diagnosis not present

## 2019-08-25 DIAGNOSIS — C73 Malignant neoplasm of thyroid gland: Secondary | ICD-10-CM | POA: Diagnosis not present

## 2019-08-25 DIAGNOSIS — E785 Hyperlipidemia, unspecified: Secondary | ICD-10-CM | POA: Diagnosis not present

## 2019-08-30 DIAGNOSIS — D2261 Melanocytic nevi of right upper limb, including shoulder: Secondary | ICD-10-CM | POA: Diagnosis not present

## 2019-08-30 DIAGNOSIS — L570 Actinic keratosis: Secondary | ICD-10-CM | POA: Diagnosis not present

## 2019-08-30 DIAGNOSIS — D2262 Melanocytic nevi of left upper limb, including shoulder: Secondary | ICD-10-CM | POA: Diagnosis not present

## 2019-08-30 DIAGNOSIS — L814 Other melanin hyperpigmentation: Secondary | ICD-10-CM | POA: Diagnosis not present

## 2019-08-30 DIAGNOSIS — C4441 Basal cell carcinoma of skin of scalp and neck: Secondary | ICD-10-CM | POA: Diagnosis not present

## 2019-08-30 DIAGNOSIS — D485 Neoplasm of uncertain behavior of skin: Secondary | ICD-10-CM | POA: Diagnosis not present

## 2019-08-30 DIAGNOSIS — X32XXXA Exposure to sunlight, initial encounter: Secondary | ICD-10-CM | POA: Diagnosis not present

## 2019-08-30 DIAGNOSIS — D0461 Carcinoma in situ of skin of right upper limb, including shoulder: Secondary | ICD-10-CM | POA: Diagnosis not present

## 2019-08-30 DIAGNOSIS — D2272 Melanocytic nevi of left lower limb, including hip: Secondary | ICD-10-CM | POA: Diagnosis not present

## 2019-08-30 DIAGNOSIS — Z85828 Personal history of other malignant neoplasm of skin: Secondary | ICD-10-CM | POA: Diagnosis not present

## 2019-09-01 ENCOUNTER — Other Ambulatory Visit: Payer: Self-pay | Admitting: Family Medicine

## 2019-09-05 ENCOUNTER — Other Ambulatory Visit: Payer: Self-pay | Admitting: Family Medicine

## 2019-09-21 DIAGNOSIS — D0461 Carcinoma in situ of skin of right upper limb, including shoulder: Secondary | ICD-10-CM | POA: Diagnosis not present

## 2019-10-05 DIAGNOSIS — C4441 Basal cell carcinoma of skin of scalp and neck: Secondary | ICD-10-CM | POA: Diagnosis not present

## 2019-10-10 ENCOUNTER — Telehealth (INDEPENDENT_AMBULATORY_CARE_PROVIDER_SITE_OTHER): Payer: Medicare HMO | Admitting: Family Medicine

## 2019-10-10 ENCOUNTER — Encounter: Payer: Self-pay | Admitting: Family Medicine

## 2019-10-10 ENCOUNTER — Other Ambulatory Visit: Payer: Self-pay

## 2019-10-10 DIAGNOSIS — F4323 Adjustment disorder with mixed anxiety and depressed mood: Secondary | ICD-10-CM | POA: Diagnosis not present

## 2019-10-10 DIAGNOSIS — C4441 Basal cell carcinoma of skin of scalp and neck: Secondary | ICD-10-CM | POA: Diagnosis not present

## 2019-10-10 DIAGNOSIS — K219 Gastro-esophageal reflux disease without esophagitis: Secondary | ICD-10-CM

## 2019-10-10 DIAGNOSIS — I10 Essential (primary) hypertension: Secondary | ICD-10-CM | POA: Diagnosis not present

## 2019-10-10 DIAGNOSIS — E039 Hypothyroidism, unspecified: Secondary | ICD-10-CM

## 2019-10-10 DIAGNOSIS — C4491 Basal cell carcinoma of skin, unspecified: Secondary | ICD-10-CM | POA: Insufficient documentation

## 2019-10-10 DIAGNOSIS — R69 Illness, unspecified: Secondary | ICD-10-CM | POA: Diagnosis not present

## 2019-10-10 DIAGNOSIS — E041 Nontoxic single thyroid nodule: Secondary | ICD-10-CM | POA: Diagnosis not present

## 2019-10-10 DIAGNOSIS — E782 Mixed hyperlipidemia: Secondary | ICD-10-CM

## 2019-10-10 NOTE — Assessment & Plan Note (Signed)
Mildly elevated today though otherwise well controlled.  She will monitor over the next few days and send [place] her readings.  Continue amlodipine.

## 2019-10-10 NOTE — Assessment & Plan Note (Signed)
She will continue to see endocrinology for follow-up on this.

## 2019-10-10 NOTE — Assessment & Plan Note (Signed)
Some mild anxiety.  She will continue exercise and amitriptyline.  She will monitor.

## 2019-10-10 NOTE — Assessment & Plan Note (Signed)
Asymptomatic on omeprazole.  She will continue this medication.

## 2019-10-10 NOTE — Assessment & Plan Note (Signed)
Discussed that the patient is in an intermediate risk category.  Advised that she could consider medication or continue to work on diet and exercise.  Discussed continuing with diet and exercise and then rechecking in 3 months.

## 2019-10-10 NOTE — Progress Notes (Signed)
Virtual Visit via telephone Note  This visit type was conducted due to national recommendations for restrictions regarding the COVID-19 pandemic (e.g. social distancing).  This format is felt to be most appropriate for this patient at this time.  All issues noted in this document were discussed and addressed.  No physical exam was performed (except for noted visual exam findings with Video Visits).   I connected with Erin [name] today at  8:00 AM EST by telephone and verified that I am speaking with the correct person using two identifiers. Location patient: home Location provider: work Persons participating in the virtual visit: patient, provider  I discussed the limitations, risks, security and privacy concerns of performing an evaluation and management service by telephone and the availability of in person appointments. I also discussed with the patient that there may be a patient responsible charge related to this service. The patient expressed understanding and agreed to proceed.  Interactive audio and video telecommunications were attempted between this provider and patient, however failed, due to patient having technical difficulties OR patient did not have access to video capability.  We continued and completed visit with audio only.   Reason for visit: follow-up  HPI: Hypothyroidism: Taking Synthroid.  No skin changes.  No heat or cold intolerance.  She is following with endocrinology as well for this.  They are monitoring her thyroid nodule and are following up with her once yearly now.  She notes there have been no changes to the nodule.  Hypertension: Typically 120-125/80.  Today was 135/90.  She does note a friend passed away and there is a funeral that she is going to today so that may be contributing.  She takes amlodipine.  No chest pain, shortness of breath, or edema.  Anxiety: Mild in nature.  Her friend passed away recently.  She also notes staying by herself with the  pandemic has been getting to her.  She is back to doing walks and going to the gym which is helpful.  No depression.  GERD: Taking omeprazole.  No reflux, abdominal pain, blood in her stool, or dysphagia.  She had an EGD in 2007 that revealed an ulcer and she notes she was to remain on a PPI at that time.  Basal cell carcinoma: Patient notes having 1 of these removed from her neck and one from her shoulder.  She sees dermatology once yearly for this.  She does follow-up with him in August for recheck of these areas.  Hyperlipidemia: She is been working on diet and exercise.  She has lost 5 to 6 pounds.  The 10-year ASCVD risk score Mikey Bussing DC Brooke Bonito., et al., 2013) is: 11.9%   Values used to calculate the score:     Age: 69 years     Sex: Female     Is Non-Hispanic African American: No     Diabetic: No     Tobacco smoker: No     Systolic Blood Pressure: [id number] mmHg     Is BP treated: Yes     HDL Cholesterol: 44 mg/dL     Total Cholesterol: 242 mg/dL   ROS: See pertinent positives and negatives per HPI.  Past Medical History:  Diagnosis Date  . Cancer [facility]) 2011   thyroid, Dr. Carlis Abbott  . Hyperlipidemia    Hx  . Thyroid disease     Past Surgical History:  Procedure Laterality Date  . CHOLECYSTECTOMY    . COLONOSCOPY WITH PROPOFOL N/A 12/23/2016   Procedure: COLONOSCOPY WITH  PROPOFOL;  Surgeon: Lollie Sails, MD;  Location: [facility] ENDOSCOPY;  Service: Endoscopy;  Laterality: N/A;  . GALLBLADDER SURGERY  2000  . KNEE SURGERY  2003/ 2010   right knee x2 Dr. Marry Guan for torn meniscus  . [place]   right, arthritis  . THYROIDECTOMY  2011   right side    Family History  Problem Relation Age of Onset  . Stroke Mother   . Heart disease Father   . Heart attack Father   . Cancer Maternal Grandmother        pancreatic  . Breast cancer Neg Hx     SOCIAL HX: nonsmoker   Current Outpatient Medications:  .  amitriptyline (ELAVIL) 50 MG tablet, TAKE 1 TABLET BY MOUTH  EVERYDAY AT BEDTIME, Disp: 90 tablet, Rfl: 1 .  amLODipine (NORVASC) 5 MG tablet, Take 1 tablet (5 mg total) by mouth daily., Disp: 90 tablet, Rfl: 3 .  Calcium Carb-Cholecalciferol (CALCIUM 1000 + D PO), Take by mouth., Disp: , Rfl:  .  cholecalciferol (VITAMIN D) 1000 UNITS tablet, Take 1,000 Units by mouth daily., Disp: , Rfl:  .  levothyroxine (SYNTHROID) 75 MCG tablet, TAKE 1 TABLET (75 MCG TOTAL) BY MOUTH DAILY BEFORE BREAKFAST., Disp: 90 tablet, Rfl: 3 .  Multiple Vitamins-Minerals (CENTRUM SILVER PO), Take by mouth., Disp: , Rfl:  .  NON FORMULARY, goli gummies - apple cider gummies takes 2-3 gummies once daily, Disp: , Rfl:  .  omeprazole (PRILOSEC) 20 MG capsule, TAKE 1 CAPSULE BY MOUTH EVERY DAY, Disp: 90 capsule, Rfl: 2 .  fluticasone (FLONASE) 50 MCG/ACT nasal spray, Place 2 sprays into both nostrils daily. (Patient not taking: Reported on 10/10/2019), Disp: 16 g, Rfl: 6  EXAM: This was a telehealth telephone visit and thus no physical exam was completed.   ASSESSMENT AND PLAN:  Discussed the following assessment and plan:  GERD (gastroesophageal reflux disease) Asymptomatic on omeprazole.  She will continue this medication.  Hypothyroidism TSH stable previously.  Continue Synthroid.  Thyroid nodule She will continue to see endocrinology for follow-up on this.  Adjustment disorder with mixed anxiety and depressed mood Some mild anxiety.  She will continue exercise and amitriptyline.  She will monitor.  Hypertension Mildly elevated today though otherwise well controlled.  She will monitor over the next few days and send [place] her readings.  Continue amlodipine.  Basal cell carcinoma She will continue to see dermatology for this.  Hyperlipidemia Discussed that the patient is in an intermediate risk category.  Advised that she could consider medication or continue to work on diet and exercise.  Discussed continuing with diet and exercise and then rechecking in 3  months.   No orders of the defined types were placed in this encounter.   No orders of the defined types were placed in this encounter.    I discussed the assessment and treatment plan with the patient. The patient was provided an opportunity to ask questions and all were answered. The patient agreed with the plan and demonstrated an understanding of the instructions.   The patient was advised to call back or seek an in-person evaluation if the symptoms worsen or if the condition fails to improve as anticipated.  I provided 12 minutes of non-face-to-face time during this encounter.   [name], MD

## 2019-10-10 NOTE — Assessment & Plan Note (Signed)
She will continue to see dermatology for this.

## 2019-10-10 NOTE — Assessment & Plan Note (Signed)
TSH stable previously.  Continue Synthroid.

## 2019-10-14 ENCOUNTER — Encounter: Payer: Self-pay | Admitting: Family Medicine

## 2019-10-20 DIAGNOSIS — H2513 Age-related nuclear cataract, bilateral: Secondary | ICD-10-CM | POA: Diagnosis not present

## 2019-11-14 DIAGNOSIS — I10 Essential (primary) hypertension: Secondary | ICD-10-CM | POA: Diagnosis not present

## 2019-11-14 DIAGNOSIS — H2511 Age-related nuclear cataract, right eye: Secondary | ICD-10-CM | POA: Diagnosis not present

## 2019-11-20 ENCOUNTER — Encounter: Payer: Self-pay | Admitting: Family Medicine

## 2019-11-21 ENCOUNTER — Encounter: Payer: Self-pay | Admitting: Family Medicine

## 2019-11-22 ENCOUNTER — Encounter: Payer: Self-pay | Admitting: Ophthalmology

## 2019-11-22 ENCOUNTER — Other Ambulatory Visit: Payer: Self-pay

## 2019-11-22 DIAGNOSIS — N921 Excessive and frequent menstruation with irregular cycle: Secondary | ICD-10-CM | POA: Diagnosis not present

## 2019-11-25 ENCOUNTER — Other Ambulatory Visit
Admission: RE | Admit: 2019-11-25 | Discharge: 2019-11-25 | Disposition: A | Discharge status: Home / Self Care | Payer: Medicare HMO | Source: Ambulatory Visit | Attending: Ophthalmology | Admitting: Ophthalmology

## 2019-11-25 DIAGNOSIS — Z01812 Encounter for preprocedural laboratory examination: Secondary | ICD-10-CM | POA: Insufficient documentation

## 2019-11-25 DIAGNOSIS — Z20822 Contact with and (suspected) exposure to covid-19: Secondary | ICD-10-CM | POA: Diagnosis not present

## 2019-11-25 LAB — SARS CORONAVIRUS 2 (TAT 6-24 HRS): SARS Coronavirus 2: NEGATIVE

## 2019-11-25 NOTE — Discharge Instructions (Signed)

## 2019-11-29 ENCOUNTER — Ambulatory Visit
Admission: RE | Admit: 2019-11-29 | Discharge: 2019-11-29 | Disposition: A | Discharge status: Home / Self Care | Payer: Medicare HMO | Attending: Ophthalmology | Admitting: Ophthalmology

## 2019-11-29 ENCOUNTER — Encounter
Admission: RE | Disposition: A | Discharge status: Home / Self Care | Payer: Self-pay | Source: Home / Self Care | Attending: Ophthalmology

## 2019-11-29 ENCOUNTER — Other Ambulatory Visit: Payer: Self-pay

## 2019-11-29 ENCOUNTER — Ambulatory Visit: Payer: Medicare HMO | Admitting: Anesthesiology

## 2019-11-29 ENCOUNTER — Encounter: Payer: Self-pay | Admitting: Ophthalmology

## 2019-11-29 DIAGNOSIS — Z79899 Other long term (current) drug therapy: Secondary | ICD-10-CM | POA: Insufficient documentation

## 2019-11-29 DIAGNOSIS — F419 Anxiety disorder, unspecified: Secondary | ICD-10-CM | POA: Insufficient documentation

## 2019-11-29 DIAGNOSIS — K219 Gastro-esophageal reflux disease without esophagitis: Secondary | ICD-10-CM | POA: Insufficient documentation

## 2019-11-29 DIAGNOSIS — I10 Essential (primary) hypertension: Secondary | ICD-10-CM | POA: Diagnosis not present

## 2019-11-29 DIAGNOSIS — Z79890 Hormone replacement therapy (postmenopausal): Secondary | ICD-10-CM | POA: Diagnosis not present

## 2019-11-29 DIAGNOSIS — H2511 Age-related nuclear cataract, right eye: Secondary | ICD-10-CM | POA: Insufficient documentation

## 2019-11-29 DIAGNOSIS — Z85850 Personal history of malignant neoplasm of thyroid: Secondary | ICD-10-CM | POA: Insufficient documentation

## 2019-11-29 DIAGNOSIS — H25811 Combined forms of age-related cataract, right eye: Secondary | ICD-10-CM | POA: Diagnosis not present

## 2019-11-29 DIAGNOSIS — E890 Postprocedural hypothyroidism: Secondary | ICD-10-CM | POA: Diagnosis not present

## 2019-11-29 DIAGNOSIS — R69 Illness, unspecified: Secondary | ICD-10-CM | POA: Diagnosis not present

## 2019-11-29 HISTORY — PX: CATARACT EXTRACTION W/PHACO: SHX586

## 2019-11-29 HISTORY — DX: Nausea with vomiting, unspecified: Z98.890

## 2019-11-29 HISTORY — DX: Gastro-esophageal reflux disease without esophagitis: K21.9

## 2019-11-29 HISTORY — DX: Essential (primary) hypertension: I10

## 2019-11-29 HISTORY — DX: Other specified postprocedural states: R11.2

## 2019-11-29 SURGERY — PHACOEMULSIFICATION, CATARACT, WITH IOL INSERTION
Anesthesia: Monitor Anesthesia Care | Site: Eye | Laterality: Right

## 2019-11-29 MED ORDER — NA CHONDROIT SULF-NA HYALURON 40-17 MG/ML IO SOLN
INTRAOCULAR | Status: DC | PRN
  Administered 2019-11-29: 1 mL via INTRAOCULAR

## 2019-11-29 MED ORDER — LIDOCAINE HCL (PF) 2 % IJ SOLN
INTRAOCULAR | Status: DC | PRN
  Administered 2019-11-29: 2 mL

## 2019-11-29 MED ORDER — MOXIFLOXACIN HCL 0.5 % OP SOLN
OPHTHALMIC | Status: DC | PRN
  Administered 2019-11-29: 0.2 mL via OPHTHALMIC

## 2019-11-29 MED ORDER — ARMC OPHTHALMIC DILATING DROPS
1.0000 "application " | OPHTHALMIC | Status: DC | PRN
  Administered 2019-11-29 (×3): 1 via OPHTHALMIC

## 2019-11-29 MED ORDER — EPINEPHRINE PF 1 MG/ML IJ SOLN
INTRAOCULAR | Status: DC | PRN
  Administered 2019-11-29: 40 mL via OPHTHALMIC

## 2019-11-29 MED ORDER — TETRACAINE HCL 0.5 % OP SOLN
1.0000 [drp] | OPHTHALMIC | Status: DC | PRN
  Administered 2019-11-29 (×3): 1 [drp] via OPHTHALMIC

## 2019-11-29 MED ORDER — MIDAZOLAM HCL 2 MG/2ML IJ SOLN
INTRAMUSCULAR | Status: DC | PRN
  Administered 2019-11-29 (×2): 1 mg via INTRAVENOUS

## 2019-11-29 MED ORDER — FENTANYL CITRATE (PF) 100 MCG/2ML IJ SOLN
INTRAMUSCULAR | Status: DC | PRN
  Administered 2019-11-29: 50 ug via INTRAVENOUS

## 2019-11-29 MED ORDER — BRIMONIDINE TARTRATE-TIMOLOL 0.2-0.5 % OP SOLN
OPHTHALMIC | Status: DC | PRN
  Administered 2019-11-29: 1 [drp] via OPHTHALMIC

## 2019-11-29 SURGICAL SUPPLY — 22 items
CANNULA ANT/CHMB 27G (MISCELLANEOUS) ×2 IMPLANT
CANNULA ANT/CHMB 27GA (MISCELLANEOUS) ×4 IMPLANT
DISSECTOR HYDRO NUCLEUS 50X22 (MISCELLANEOUS) ×2 IMPLANT
GLOVE SURG LX 8.0 MICRO (GLOVE) ×1
GLOVE SURG LX STRL 8.0 MICRO (GLOVE) ×1 IMPLANT
GLOVE SURG TRIUMPH 8.0 PF LTX (GLOVE) ×2 IMPLANT
GOWN STRL REUS W/ TWL LRG LVL3 (GOWN DISPOSABLE) ×2 IMPLANT
GOWN STRL REUS W/TWL LRG LVL3 (GOWN DISPOSABLE) ×2
LENS IOL ACRSF VT TRC 415 22.0 IMPLANT
LENS IOL ACRYSOF VIVITY 22.0 ×1 IMPLANT
LENS IOL VIVITY 415 22.0 ×1 IMPLANT
MARKER SKIN DUAL TIP RULER LAB (MISCELLANEOUS) ×2 IMPLANT
NDL FILTER BLUNT 18X1 1/2 (NEEDLE) ×1 IMPLANT
NEEDLE FILTER BLUNT 18X 1/2SAF (NEEDLE) ×1
NEEDLE FILTER BLUNT 18X1 1/2 (NEEDLE) ×1 IMPLANT
PACK EYE AFTER SURG (MISCELLANEOUS) ×2 IMPLANT
PACK OPTHALMIC (MISCELLANEOUS) ×2 IMPLANT
PACK PORFILIO (MISCELLANEOUS) ×2 IMPLANT
SYR 3ML LL SCALE MARK (SYRINGE) ×2 IMPLANT
SYR TB 1ML LUER SLIP (SYRINGE) ×2 IMPLANT
WATER STERILE IRR 250ML POUR (IV SOLUTION) ×2 IMPLANT
WIPE NON LINTING 3.25X3.25 (MISCELLANEOUS) ×2 IMPLANT

## 2019-11-29 NOTE — Op Note (Signed)
PREOPERATIVE DIAGNOSIS:  Nuclear sclerotic cataract of the right eye.   POSTOPERATIVE DIAGNOSIS:  Nuclear sclerotic cataract of the right eye.   OPERATIVE PROCEDURE: Procedure(s): CATARACT EXTRACTION PHACO AND INTRAOCULAR LENS PLACEMENT (IOC) RIGHT VIVITY TORIC LENS 8.80 00:45.2    SURGEON:  Birder Robson, MD.   ANESTHESIA: 1.      Managed anesthesia care. 2.     0.23ml of Shugarcaine was instilled following the paracentesis  Anesthesiologist: Fidel Levy, MD CRNA: Cameron Ali, CRNA  COMPLICATIONS:  None.   TECHNIQUE:   Stop and chop    DESCRIPTION OF PROCEDURE:  The patient was examined and consented in the preoperative holding area where the aforementioned topical anesthesia was applied to the right eye.  The patient was brought back to the Operating Room where he was sat upright on the gurney and given a target to fixate upon while the eye was marked at the 3:00 and 9:00 position.  The patient was then reclined on the operating table.  The eye was prepped and draped in the usual sterile ophthalmic fashion and a lid speculum was placed. A paracentesis was created with the side port blade and the anterior chamber was filled with viscoelastic. A near clear corneal incision was performed with the steel keratome. A continuous curvilinear capsulorrhexis was performed with a cystotome followed by the capsulorrhexis forceps. Hydrodissection and hydrodelineation were carried out with BSS on a blunt cannula. The lens was removed in a stop and chop technique and the remaining cortical material was removed with the irrigation-aspiration handpiece. The eye was inflated with viscoelastic and the [id number]  lens  was placed in the eye and rotated to within a few degrees of the predetermined orientation.  The remaining viscoelastic was removed from the eye.  The Sinskey hook was used to rotate the toric lens into its final resting place at 173 degrees.  0. The eye was inflated to a physiologic pressure and  found to be watertight. 0.77ml of Vigamox was placed in the anterior chamber.  The eye was dressed with Vigamox and Combigan. The patient was given protective glasses to wear throughout the day and a shield with which to sleep tonight. The patient was also given drops with which to begin a drop regimen today and will follow-up with me in one day. Implant Name Type Inv. Item Serial No. Manufacturer Lot No. LRB No. Used Action  LENS ACRYSOF VIVITY TORIC 22.0 - [id number]  LENS ACRYSOF VIVITY TORIC 22.0 [id number] ALCON  Right 1 Implanted   Procedure(s): CATARACT EXTRACTION PHACO AND INTRAOCULAR LENS PLACEMENT (IOC) RIGHT VIVITY TORIC LENS 8.80 00:45.2  (Right)  Electronically signed: Birder Robson 11/29/2019 8:46 AM

## 2019-11-29 NOTE — Anesthesia Postprocedure Evaluation (Signed)
Anesthesia Post Note  Patient: Erin [name]  Procedure(s) Performed: CATARACT EXTRACTION PHACO AND INTRAOCULAR LENS PLACEMENT (IOC) RIGHT VIVITY TORIC LENS 8.80 00:45.2  (Right Eye)     Patient location during evaluation: PACU Anesthesia Type: MAC Level of consciousness: awake and alert Pain management: pain level controlled Vital Signs Assessment: post-procedure vital signs reviewed and stable Respiratory status: spontaneous breathing, nonlabored ventilation, respiratory function stable and patient connected to nasal cannula oxygen Cardiovascular status: stable and blood pressure returned to baseline Postop Assessment: no apparent nausea or vomiting Anesthetic complications: no    Ionna Avis

## 2019-11-29 NOTE — Anesthesia Preprocedure Evaluation (Signed)
Anesthesia Evaluation  Patient identified by MRN, date of birth, ID band Patient awake    Reviewed: NPO status   History of Anesthesia Complications (+) PONV and history of anesthetic complications  Airway Mallampati: II  TM Distance: >3 FB Neck ROM: full    Dental no notable dental hx.    Pulmonary neg pulmonary ROS,    Pulmonary exam normal        Cardiovascular Exercise Tolerance: Good hypertension, Normal cardiovascular exam     Neuro/Psych Anxiety negative neurological ROS     GI/Hepatic Neg liver ROS, GERD  ,  Endo/Other  Hypothyroidism   Renal/GU negative Renal ROS  negative genitourinary   Musculoskeletal  (+) Arthritis ,   Abdominal   Peds  Hematology negative hematology ROS (+) Thyroid ca : 2011    Anesthesia Other Findings Covid: NEG.  PCP stable:  Leone Haven, MD at 10/10/2019  ;   Reproductive/Obstetrics                             Anesthesia Physical Anesthesia Plan  ASA: II  Anesthesia Plan: MAC   Post-op Pain Management:    Induction:   PONV Risk Score and Plan: 3 and TIVA, Midazolam and Treatment may vary due to age or medical condition  Airway Management Planned:   Additional Equipment:   Intra-op Plan:   Post-operative Plan:   Informed Consent: I have reviewed the patients History and Physical, chart, labs and discussed the procedure including the risks, benefits and alternatives for the proposed anesthesia with the patient or authorized representative who has indicated his/her understanding and acceptance.       Plan Discussed with: CRNA  Anesthesia Plan Comments:         Anesthesia Quick Evaluation

## 2019-11-29 NOTE — Transfer of Care (Signed)
Immediate Anesthesia Transfer of Care Note  Patient: Erin [name]  Procedure(s) Performed: CATARACT EXTRACTION PHACO AND INTRAOCULAR LENS PLACEMENT (IOC) RIGHT VIVITY TORIC LENS 8.80 00:45.2  (Right Eye)  Patient Location: PACU  Anesthesia Type: MAC  Level of Consciousness: awake, alert  and patient cooperative  Airway and Oxygen Therapy: Patient Spontanous Breathing and Patient connected to supplemental oxygen  Post-op Assessment: Post-op Vital signs reviewed, Patient's Cardiovascular Status Stable, Respiratory Function Stable, Patent Airway and No signs of Nausea or vomiting  Post-op Vital Signs: Reviewed and stable  Complications: No apparent anesthesia complications

## 2019-11-29 NOTE — H&P (Signed)
All labs reviewed. Abnormal studies sent to patients PCP when indicated.  Previous H&P reviewed, patient examined, there are NO CHANGES.  Erin [name] Porfilio4/27/20218:21 AM

## 2019-11-29 NOTE — Anesthesia Procedure Notes (Signed)
Procedure Name: MAC Performed by: Tangelia Sanson, CRNA Pre-anesthesia Checklist: Patient identified, Emergency Drugs available, Suction available, Timeout performed and Patient being monitored Patient Re-evaluated:Patient Re-evaluated prior to induction Oxygen Delivery Method: Nasal cannula Placement Confirmation: positive ETCO2       

## 2019-11-30 ENCOUNTER — Encounter: Payer: Self-pay | Admitting: *Deleted

## 2019-11-30 DIAGNOSIS — I10 Essential (primary) hypertension: Secondary | ICD-10-CM | POA: Diagnosis not present

## 2019-11-30 DIAGNOSIS — H2512 Age-related nuclear cataract, left eye: Secondary | ICD-10-CM | POA: Diagnosis not present

## 2019-12-12 ENCOUNTER — Encounter: Payer: Self-pay | Admitting: Ophthalmology

## 2019-12-12 ENCOUNTER — Other Ambulatory Visit: Payer: Self-pay

## 2019-12-16 ENCOUNTER — Other Ambulatory Visit: Payer: Self-pay

## 2019-12-16 ENCOUNTER — Other Ambulatory Visit
Admission: RE | Admit: 2019-12-16 | Discharge: 2019-12-16 | Disposition: A | Discharge status: Home / Self Care | Payer: Medicare HMO | Source: Ambulatory Visit | Attending: Ophthalmology | Admitting: Ophthalmology

## 2019-12-16 DIAGNOSIS — Z01812 Encounter for preprocedural laboratory examination: Secondary | ICD-10-CM | POA: Diagnosis not present

## 2019-12-16 DIAGNOSIS — Z20822 Contact with and (suspected) exposure to covid-19: Secondary | ICD-10-CM | POA: Insufficient documentation

## 2019-12-16 LAB — SARS CORONAVIRUS 2 (TAT 6-24 HRS): SARS Coronavirus 2: NEGATIVE

## 2019-12-19 NOTE — Discharge Instructions (Signed)

## 2019-12-20 ENCOUNTER — Ambulatory Visit
Admission: RE | Admit: 2019-12-20 | Discharge: 2019-12-20 | Disposition: A | Discharge status: Home / Self Care | Payer: Medicare HMO | Attending: Ophthalmology | Admitting: Ophthalmology

## 2019-12-20 ENCOUNTER — Encounter: Payer: Self-pay | Admitting: Ophthalmology

## 2019-12-20 ENCOUNTER — Other Ambulatory Visit: Payer: Self-pay

## 2019-12-20 ENCOUNTER — Ambulatory Visit: Payer: Medicare HMO | Admitting: Anesthesiology

## 2019-12-20 ENCOUNTER — Encounter
Admission: RE | Disposition: A | Discharge status: Home / Self Care | Payer: Self-pay | Source: Home / Self Care | Attending: Ophthalmology

## 2019-12-20 DIAGNOSIS — H25812 Combined forms of age-related cataract, left eye: Secondary | ICD-10-CM | POA: Diagnosis not present

## 2019-12-20 DIAGNOSIS — E890 Postprocedural hypothyroidism: Secondary | ICD-10-CM | POA: Insufficient documentation

## 2019-12-20 DIAGNOSIS — F419 Anxiety disorder, unspecified: Secondary | ICD-10-CM | POA: Insufficient documentation

## 2019-12-20 DIAGNOSIS — Z79899 Other long term (current) drug therapy: Secondary | ICD-10-CM | POA: Diagnosis not present

## 2019-12-20 DIAGNOSIS — H2512 Age-related nuclear cataract, left eye: Secondary | ICD-10-CM | POA: Insufficient documentation

## 2019-12-20 DIAGNOSIS — R69 Illness, unspecified: Secondary | ICD-10-CM | POA: Diagnosis not present

## 2019-12-20 DIAGNOSIS — Z79890 Hormone replacement therapy (postmenopausal): Secondary | ICD-10-CM | POA: Diagnosis not present

## 2019-12-20 DIAGNOSIS — I10 Essential (primary) hypertension: Secondary | ICD-10-CM | POA: Insufficient documentation

## 2019-12-20 DIAGNOSIS — Z85828 Personal history of other malignant neoplasm of skin: Secondary | ICD-10-CM | POA: Diagnosis not present

## 2019-12-20 DIAGNOSIS — K219 Gastro-esophageal reflux disease without esophagitis: Secondary | ICD-10-CM | POA: Insufficient documentation

## 2019-12-20 DIAGNOSIS — Z85850 Personal history of malignant neoplasm of thyroid: Secondary | ICD-10-CM | POA: Diagnosis not present

## 2019-12-20 HISTORY — PX: CATARACT EXTRACTION W/PHACO: SHX586

## 2019-12-20 SURGERY — PHACOEMULSIFICATION, CATARACT, WITH IOL INSERTION
Anesthesia: Monitor Anesthesia Care | Site: Eye | Laterality: Left

## 2019-12-20 MED ORDER — TETRACAINE HCL 0.5 % OP SOLN
1.0000 [drp] | OPHTHALMIC | Status: DC | PRN
  Administered 2019-12-20 (×3): 1 [drp] via OPHTHALMIC

## 2019-12-20 MED ORDER — ARMC OPHTHALMIC DILATING DROPS
1.0000 "application " | OPHTHALMIC | Status: DC | PRN
  Administered 2019-12-20 (×3): 1 via OPHTHALMIC

## 2019-12-20 MED ORDER — BRIMONIDINE TARTRATE-TIMOLOL 0.2-0.5 % OP SOLN
OPHTHALMIC | Status: DC | PRN
  Administered 2019-12-20: 1 [drp] via OPHTHALMIC

## 2019-12-20 MED ORDER — LIDOCAINE HCL (PF) 2 % IJ SOLN
INTRAOCULAR | Status: DC | PRN
  Administered 2019-12-20: 1 mL

## 2019-12-20 MED ORDER — ACETAMINOPHEN 10 MG/ML IV SOLN: 1000.0000 mg | Freq: Once | INTRAVENOUS | Status: DC | PRN

## 2019-12-20 MED ORDER — FENTANYL CITRATE (PF) 100 MCG/2ML IJ SOLN
INTRAMUSCULAR | Status: DC | PRN
  Administered 2019-12-20 (×2): 50 ug via INTRAVENOUS

## 2019-12-20 MED ORDER — NA CHONDROIT SULF-NA HYALURON 40-17 MG/ML IO SOLN
INTRAOCULAR | Status: DC | PRN
  Administered 2019-12-20: 1 mL via INTRAOCULAR

## 2019-12-20 MED ORDER — MOXIFLOXACIN HCL 0.5 % OP SOLN
OPHTHALMIC | Status: DC | PRN
  Administered 2019-12-20: 0.2 mL via OPHTHALMIC

## 2019-12-20 MED ORDER — ONDANSETRON HCL 4 MG/2ML IJ SOLN: 4.0000 mg | Freq: Once | INTRAMUSCULAR | Status: DC | PRN

## 2019-12-20 MED ORDER — LACTATED RINGERS IV SOLN: 100.0000 mL/h | INTRAVENOUS | Status: DC

## 2019-12-20 MED ORDER — MIDAZOLAM HCL 2 MG/2ML IJ SOLN
INTRAMUSCULAR | Status: DC | PRN
  Administered 2019-12-20 (×2): 1 mg via INTRAVENOUS

## 2019-12-20 MED ORDER — EPINEPHRINE PF 1 MG/ML IJ SOLN
INTRAOCULAR | Status: DC | PRN
  Administered 2019-12-20: 46 mL via OPHTHALMIC

## 2019-12-20 SURGICAL SUPPLY — 19 items
CANNULA ANT/CHMB 27GA (MISCELLANEOUS) ×4 IMPLANT
GLOVE SURG LX 8.0 MICRO (GLOVE) ×1
GLOVE SURG LX STRL 8.0 MICRO (GLOVE) ×1 IMPLANT
GLOVE SURG TRIUMPH 8.0 PF LTX (GLOVE) ×2 IMPLANT
GOWN STRL REUS W/ TWL LRG LVL3 (GOWN DISPOSABLE) ×2 IMPLANT
GOWN STRL REUS W/TWL LRG LVL3 (GOWN DISPOSABLE) ×2
LENS IOL ACRSF VT TRC 415 22.5 ×1 IMPLANT
LENS IOL ACRYSOF VIVITY 22.5 ×1 IMPLANT
LENS IOL VIVITY 415 22.5 ×1 IMPLANT
MARKER SKIN DUAL TIP RULER LAB (MISCELLANEOUS) ×2 IMPLANT
NEEDLE FILTER BLUNT 18X 1/2SAF (NEEDLE) ×1
NEEDLE FILTER BLUNT 18X1 1/2 (NEEDLE) ×1 IMPLANT
PACK EYE AFTER SURG (MISCELLANEOUS) ×2 IMPLANT
PACK OPTHALMIC (MISCELLANEOUS) ×2 IMPLANT
PACK PORFILIO (MISCELLANEOUS) ×2 IMPLANT
SYR 3ML LL SCALE MARK (SYRINGE) ×2 IMPLANT
SYR TB 1ML LUER SLIP (SYRINGE) ×2 IMPLANT
WATER STERILE IRR 250ML POUR (IV SOLUTION) ×2 IMPLANT
WIPE NON LINTING 3.25X3.25 (MISCELLANEOUS) ×2 IMPLANT

## 2019-12-20 NOTE — Anesthesia Postprocedure Evaluation (Signed)
Anesthesia Post Note  Patient: Erin [name]  Procedure(s) Performed: CATARACT EXTRACTION PHACO AND INTRAOCULAR LENS PLACEMENT (IOC) LEFT VIVITY TORIC LENS 7.51  00:43.7 (Left Eye)     Patient location during evaluation: PACU Anesthesia Type: MAC Level of consciousness: awake and alert Pain management: pain level controlled Vital Signs Assessment: post-procedure vital signs reviewed and stable Respiratory status: spontaneous breathing, nonlabored ventilation, respiratory function stable and patient connected to nasal cannula oxygen Cardiovascular status: stable and blood pressure returned to baseline Postop Assessment: no apparent nausea or vomiting Anesthetic complications: no    Erica A  Heniser

## 2019-12-20 NOTE — Anesthesia Preprocedure Evaluation (Signed)
Anesthesia Evaluation  Patient identified by MRN, date of birth, ID band Patient awake    Reviewed: Allergy & Precautions, NPO status , Patient's Chart, lab work & pertinent test results, reviewed documented beta blocker date and time   History of Anesthesia Complications (+) PONV and history of anesthetic complications  Airway Mallampati: II  TM Distance: >3 FB Neck ROM: full    Dental no notable dental hx.    Pulmonary    Pulmonary exam normal breath sounds clear to auscultation       Cardiovascular Exercise Tolerance: Good hypertension, (-) angina(-) DOE Normal cardiovascular exam Rhythm:Regular Rate:Normal   HLD   Neuro/Psych PSYCHIATRIC DISORDERS Anxiety    GI/Hepatic GERD  ,  Endo/Other  Hypothyroidism   Renal/GU      Musculoskeletal  (+) Arthritis ,   Abdominal   Peds  Hematology Thyroid ca : 2011    Anesthesia Other Findings Thyroid cancer Skin cancer  Reproductive/Obstetrics                             Anesthesia Physical  Anesthesia Plan  ASA: II  Anesthesia Plan: MAC   Post-op Pain Management:    Induction:   PONV Risk Score and Plan: 3 and TIVA, Midazolam and Treatment may vary due to age or medical condition  Airway Management Planned:   Additional Equipment:   Intra-op Plan:   Post-operative Plan:   Informed Consent: I have reviewed the patients History and Physical, chart, labs and discussed the procedure including the risks, benefits and alternatives for the proposed anesthesia with the patient or authorized representative who has indicated his/her understanding and acceptance.       Plan Discussed with: CRNA  Anesthesia Plan Comments:         Anesthesia Quick Evaluation

## 2019-12-20 NOTE — Op Note (Signed)
PREOPERATIVE DIAGNOSIS:  Nuclear sclerotic cataract of the left eye.   POSTOPERATIVE DIAGNOSIS:  Nuclear sclerotic cataract of the left eye.   OPERATIVE PROCEDURE: Procedure(s): CATARACT EXTRACTION PHACO AND INTRAOCULAR LENS PLACEMENT (IOC) LEFT VIVITY TORIC LENS 7.51  00:43.7   SURGEON:  Birder Robson, MD.   ANESTHESIA: 1.      Managed anesthesia care. 2.     0.79ml os Shugarcaine was instilled following the paracentesis [email or web address]   COMPLICATIONS:  None.   TECHNIQUE:   Stop and chop    DESCRIPTION OF PROCEDURE:  The patient was examined and consented in the preoperative holding area where the aforementioned topical anesthesia was applied to the left eye.  The patient was brought back to the Operating Room where he was sat upright on the gurney and given a target to fixate upon while the eye was marked at the 3:00 and 9:00 position.  The patient was then reclined on the operating table.  The eye was prepped and draped in the usual sterile ophthalmic fashion and a lid speculum was placed. A paracentesis was created with the side port blade and the anterior chamber was filled with viscoelastic. A near clear corneal incision was performed with the steel keratome. A continuous curvilinear capsulorrhexis was performed with a cystotome followed by the capsulorrhexis forceps. Hydrodissection and hydrodelineation were carried out with BSS on a blunt cannula. The lens was removed in a stop and chop technique and the remaining cortical material was removed with the irrigation-aspiration handpiece. The eye was inflated with viscoelastic and the [id number]  lens was placed in the eye and rotated to within a few degrees of the predetermined orientation.  The remaining viscoelastic was removed from the eye.  The Sinskey hook was used to rotate the toric lens into its final resting place at 178  degrees.  0.1 ml of Vigamox was placed in the anterior chamber. The eye was inflated to a physiologic pressure and  found to be watertight.  The eye was dressed with Combigan.. The patient was given protective glasses to wear throughout the day and a shield with which to sleep tonight. The patient was also given drops with which to begin a drop regimen today and will follow-up with me in one day. Implant Name Type Inv. Item Serial No. Manufacturer Lot No. LRB No. Used Action  LENS ACRYSOF VIVITY TORIC 22.5 - [id number]  LENS ACRYSOF VIVITY TORIC 22.5 [id number] [employer]  Left 1 Implanted   Procedure(s): CATARACT EXTRACTION PHACO AND INTRAOCULAR LENS PLACEMENT (IOC) LEFT VIVITY TORIC LENS 7.51  00:43.7 (Left)  Electronically signed: Birder Robson 5/18/202112:29 PM

## 2019-12-20 NOTE — Anesthesia Procedure Notes (Signed)
Procedure Name: MAC Date/Time: 12/20/2019 12:09 PM Performed by: Vanetta Shawl, CRNA Pre-anesthesia Checklist: Patient identified, Emergency Drugs available, Suction available, Timeout performed and Patient being monitored Patient Re-evaluated:Patient Re-evaluated prior to induction Oxygen Delivery Method: Nasal cannula Placement Confirmation: positive ETCO2

## 2019-12-20 NOTE — H&P (Signed)
All labs reviewed. Abnormal studies sent to patients PCP when indicated.  Previous H&P reviewed, patient examined, there are NO CHANGES.  Erin [name] Porfilio5/18/202111:58 AM

## 2019-12-20 NOTE — Transfer of Care (Signed)
Immediate Anesthesia Transfer of Care Note  Patient: Erin [name]  Procedure(s) Performed: CATARACT EXTRACTION PHACO AND INTRAOCULAR LENS PLACEMENT (IOC) LEFT VIVITY TORIC LENS 7.51  00:43.7 (Left Eye)  Patient Location: PACU  Anesthesia Type: MAC  Level of Consciousness: awake, alert  and patient cooperative  Airway and Oxygen Therapy: Patient Spontanous Breathing and Patient connected to supplemental oxygen  Post-op Assessment: Post-op Vital signs reviewed, Patient's Cardiovascular Status Stable, Respiratory Function Stable, Patent Airway and No signs of Nausea or vomiting  Post-op Vital Signs: Reviewed and stable  Complications: No apparent anesthesia complications

## 2019-12-21 ENCOUNTER — Encounter: Payer: Self-pay | Admitting: *Deleted

## 2020-01-16 ENCOUNTER — Encounter: Payer: Self-pay | Admitting: Family Medicine

## 2020-01-16 ENCOUNTER — Other Ambulatory Visit: Payer: Self-pay

## 2020-01-16 ENCOUNTER — Ambulatory Visit (INDEPENDENT_AMBULATORY_CARE_PROVIDER_SITE_OTHER): Payer: Medicare HMO | Admitting: Family Medicine

## 2020-01-16 DIAGNOSIS — E782 Mixed hyperlipidemia: Secondary | ICD-10-CM

## 2020-01-16 DIAGNOSIS — E039 Hypothyroidism, unspecified: Secondary | ICD-10-CM | POA: Diagnosis not present

## 2020-01-16 DIAGNOSIS — I10 Essential (primary) hypertension: Secondary | ICD-10-CM | POA: Diagnosis not present

## 2020-01-16 LAB — LIPID PANEL
Cholesterol: 219 mg/dL — ABNORMAL HIGH (ref 0–200)
HDL: 55.8 mg/dL (ref >39.00)
LDL Cholesterol: 144 mg/dL — ABNORMAL HIGH (ref 0–99)
NonHDL: 163.41
Total CHOL/HDL Ratio: 4
Triglycerides: 99 mg/dL (ref 0.0–149.0)
VLDL: 19.8 mg/dL (ref 0.0–40.0)

## 2020-01-16 LAB — COMPREHENSIVE METABOLIC PANEL
ALT: 16 U/L (ref 0–35)
AST: 24 U/L (ref 0–37)
Albumin: 4.4 g/dL (ref 3.5–5.2)
Alkaline Phosphatase: 63 U/L (ref 39–117)
BUN: 16 mg/dL (ref 6–23)
CO2: 30 mEq/L (ref 19–32)
Calcium: 9.5 mg/dL (ref 8.4–10.5)
Chloride: 104 mEq/L (ref 96–112)
Creatinine, Ser: 0.73 mg/dL (ref 0.40–1.20)
GFR: 79.33 mL/min (ref >60.00)
Glucose, Bld: 100 mg/dL — ABNORMAL HIGH (ref 70–99)
Potassium: 4.3 mEq/L (ref 3.5–5.1)
Sodium: 142 mEq/L (ref 135–145)
Total Bilirubin: 0.6 mg/dL (ref 0.2–1.2)
Total Protein: 6.7 g/dL (ref 6.0–8.3)

## 2020-01-16 LAB — TSH: TSH: 1.58 u[IU]/mL (ref 0.35–4.50)

## 2020-01-16 MED ORDER — TETANUS-DIPHTHERIA TOXOIDS TD 5-2 LFU IM INJ: 0.5000 mL | INJECTION | Freq: Once | INTRAMUSCULAR | 0 refills | Status: AC

## 2020-01-16 NOTE — Assessment & Plan Note (Signed)
Check lipid panel.  Continue diet and exercise. ?

## 2020-01-16 NOTE — Progress Notes (Signed)
  [name], MD Phone: [phone]  Erin [name] is a 69 y.o. female who presents today for f/u.  HYPERTENSION  Disease Monitoring  Home BP Monitoring similar to today Chest pain- no    Dyspnea- no Medications  Compliance-  Taking amlodipine. Lightheadedness-  no  Edema- no  HYPOTHYROIDISM Disease Monitoring Weight changes: weight is down though she has been trying to lose weight  Skin Changes: no Heat/Cold intolerance: no  Medication Monitoring Compliance:  Taking synthroid   Last TSH:   Lab Results  Component Value Date   TSH 1.991 07/06/2019   HLD: has been watching what she eats. More chicken. More salads. Walking and exercising still.  Saw GYN for some external bleeding recently. They advised her to follow-up as needed for any issues. She notes getting consistent pap smears through the years with no abnormal pap smears.   Patient declines hep C screening.   Social History   Tobacco Use  Smoking Status Never Smoker  Smokeless Tobacco Never Used     ROS see history of present illness  Objective  Physical Exam Vitals:   01/16/20 0836  BP: 110/60  Pulse: 76  Temp: (!) 96.5 F (35.8 C)  SpO2: 99%    BP Readings from Last 3 Encounters:  01/16/20 110/60  12/20/19 112/86  11/29/19 106/73   Wt Readings from Last 3 Encounters:  01/16/20 119 lb 12.8 oz (54.3 kg)  12/20/19 121 lb 6.4 oz (55.1 kg)  11/29/19 120 lb (54.4 kg)    Physical Exam Constitutional:      General: She is not in acute distress.    Appearance: She is not diaphoretic.  Cardiovascular:     Rate and Rhythm: Normal rate and regular rhythm.     Heart sounds: Normal heart sounds.  Pulmonary:     Effort: Pulmonary effort is normal.     Breath sounds: Normal breath sounds.  Musculoskeletal:     Right lower leg: No edema.     Left lower leg: No edema.  Skin:    General: Skin is warm and dry.  Neurological:     Mental Status: She is alert.      Assessment/Plan: Please see  individual problem list.  Hypertension Well-controlled.  Continue amlodipine.  Check CMP.  Hypothyroidism Check TSH.  Continue Synthroid.  She will continue to see endocrinology.  Hyperlipidemia Check lipid panel.  Continue diet and exercise.   Health Maintenance: Patient declines hepatitis C screening.  Discussed COVID-19 vaccine.  She will consider this.  Advised to get tetanus vaccine.  Discussed that she would have to separate the tetanus vaccine from the Covid vaccine by at least 14 days.  Patient has aged out of Pap smear screening.  Orders Placed This Encounter  Procedures  . Lipid panel  . Comp Met (CMET)  . TSH    Meds ordered this encounter  Medications  . tetanus & diphtheria toxoids, adult, (TENIVAC) 5-2 LFU injection    Sig: Inject 0.5 mLs into the muscle once for 1 dose.    Dispense:  0.5 mL    Refill:  0    This visit occurred during the SARS-CoV-2 public health emergency.  Safety protocols were in place, including screening questions prior to the visit, additional usage of staff PPE, and extensive cleaning of exam room while observing appropriate contact time as indicated for disinfecting solutions.    [name], MD [place]

## 2020-01-16 NOTE — Assessment & Plan Note (Signed)
Check TSH.  Continue Synthroid.  She will continue to see endocrinology.

## 2020-01-16 NOTE — Assessment & Plan Note (Signed)
Well-controlled.  Continue amlodipine.  Check CMP.

## 2020-01-16 NOTE — Patient Instructions (Signed)
Nice to see you. Please get your tetanus vaccine at the pharmacy.  You will need to separate this from the Covid vaccine by at least 14 days. We will get lab work today and contact you with the results.

## 2020-01-23 ENCOUNTER — Telehealth: Payer: Self-pay

## 2020-01-24 ENCOUNTER — Ambulatory Visit: Payer: Medicare HMO | Admitting: Family Medicine

## 2020-02-09 DIAGNOSIS — Z0100 Encounter for examination of eyes and vision without abnormal findings: Secondary | ICD-10-CM | POA: Diagnosis not present

## 2020-02-28 ENCOUNTER — Other Ambulatory Visit: Payer: Self-pay | Admitting: Family Medicine

## 2020-03-06 ENCOUNTER — Ambulatory Visit (INDEPENDENT_AMBULATORY_CARE_PROVIDER_SITE_OTHER): Payer: Medicare HMO

## 2020-03-06 VITALS — Ht 63.0 in | Wt 119.0 lb

## 2020-03-06 DIAGNOSIS — Z0000 Encounter for general adult medical examination without abnormal findings: Secondary | ICD-10-CM | POA: Diagnosis not present

## 2020-03-06 NOTE — Progress Notes (Signed)
Subjective:   Erin [name] is a 69 y.o. female who presents for Medicare Annual (Subsequent) preventive examination.  Review of Systems    No ROS.  Medicare Wellness Virtual Visit.  Cardiac Risk Factors include: advanced age (>11men, >35 women);hypertension     Objective:    Today's Vitals   03/06/20 0909  Weight: 119 lb (54 kg)  Height: 5\' 3"  (1.6 m)   Body mass index is 21.08 kg/m.  Advanced Directives 03/06/2020 12/20/2019 11/29/2019 03/04/2019 12/23/2016  Does Patient Have a Medical Advance Directive? Yes Yes Yes Yes Yes  Type of Paramedic of [place];Living will [place];Living will [place];Living will [place];Living will -  Does patient want to make changes to medical advance directive? No - Patient declined - No - Patient declined No - Patient declined -  Copy of [place] in Chart? No - copy requested No - copy requested No - copy requested No - copy requested -    Current Medications (verified) Outpatient Encounter Medications as of 03/06/2020  Medication Sig  . amitriptyline (ELAVIL) 50 MG tablet TAKE 1 TABLET BY MOUTH EVERYDAY AT BEDTIME  . amLODipine (NORVASC) 5 MG tablet Take 1 tablet (5 mg total) by mouth daily.  . Calcium Carb-Cholecalciferol (CALCIUM 1000 + D PO) Take by mouth.  . cholecalciferol (VITAMIN D) 1000 UNITS tablet Take 1,000 Units by mouth daily.  . fluticasone (FLONASE) 50 MCG/ACT nasal spray Place 2 sprays into both nostrils daily.  Marland Kitchen levothyroxine (SYNTHROID) 75 MCG tablet TAKE 1 TABLET (75 MCG TOTAL) BY MOUTH DAILY BEFORE BREAKFAST.  . Multiple Vitamins-Minerals (CENTRUM SILVER PO) Take by mouth.  . NON FORMULARY goli gummies - apple cider gummies takes 2-3 gummies once daily  . omeprazole (PRILOSEC) 20 MG capsule TAKE 1 CAPSULE BY MOUTH EVERY DAY   No facility-administered encounter medications on file as of 03/06/2020.    Allergies  (verified) Amoxicillin, Atorvastatin, and Codeine   History: Past Medical History:  Diagnosis Date  . Cancer [facility]) 2011   thyroid, Dr. Carlis Abbott  . GERD (gastroesophageal reflux disease)   . Hyperlipidemia    Hx  . Hypertension   . PONV (postoperative nausea and vomiting)   . Thyroid disease    Past Surgical History:  Procedure Laterality Date  . CATARACT EXTRACTION W/PHACO Right 11/29/2019   Procedure: CATARACT EXTRACTION PHACO AND INTRAOCULAR LENS PLACEMENT ([place]) RIGHT VIVITY TORIC LENS 8.80 00:45.2 ;  Surgeon: Birder Robson, MD;  Location: [place];  Service: Ophthalmology;  Laterality: Right;  . CATARACT EXTRACTION W/PHACO Left 12/20/2019   Procedure: CATARACT EXTRACTION PHACO AND INTRAOCULAR LENS PLACEMENT ([facility]) LEFT VIVITY TORIC LENS 7.51  00:43.7;  Surgeon: Birder Robson, MD;  Location: [place];  Service: Ophthalmology;  Laterality: Left;  . CHOLECYSTECTOMY    . COLONOSCOPY WITH PROPOFOL N/A 12/23/2016   Procedure: COLONOSCOPY WITH PROPOFOL;  Surgeon: Lollie Sails, MD;  Location: [facility] ENDOSCOPY;  Service: Endoscopy;  Laterality: N/A;  . GALLBLADDER SURGERY  2000  . KNEE SURGERY  2003/ 2010   right knee x2 Dr. Marry Guan for torn meniscus  . [place]   right, arthritis  . THYROIDECTOMY  2011   right side   Family History  Problem Relation Age of Onset  . Stroke Mother   . Heart disease Father   . Heart attack Father   . Cancer Maternal Grandmother        pancreatic  .  Breast cancer Neg Hx    Social History   Socioeconomic History  . Marital status: Single    Spouse name: Not on file  . Number of children: Not on file  . Years of education: Not on file  . Highest education level: Not on file  Occupational History  . Not on file  Tobacco Use  . Smoking status: Never Smoker  . Smokeless tobacco: Never Used  Vaping Use  . Vaping Use: Never used  Substance and Sexual Activity  . Alcohol use: Yes    Alcohol/week: 0.0  standard drinks    Comment: socially (1x/week)  . Drug use: No  . Sexual activity: Never    Partners: Female  Other Topics Concern  . Not on file  Social History Narrative   Lives in [place].      Work - [employer]   Social Determinants of Radio broadcast assistant Strain:   . Difficulty of Paying Living Expenses:   Food Insecurity:   . Worried About Charity fundraiser in the Last Year:   . Arboriculturist in the Last Year:   Transportation Needs:   . Film/video editor (Medical):   Marland Kitchen Lack of Transportation (Non-Medical):   Physical Activity:   . Days of Exercise per Week:   . Minutes of Exercise per Session:   Stress:   . Feeling of Stress :   Social Connections:   . Frequency of Communication with Friends and Family:   . Frequency of Social Gatherings with Friends and Family:   . Attends Religious Services:   . Active Member of Clubs or Organizations:   . Attends Archivist Meetings:   Marland Kitchen Marital Status:     Tobacco Counseling Counseling given: Not Answered   Clinical Intake:  Pre-visit preparation completed: Yes        Diabetes: No  How often do you need to have someone help you when you read instructions, pamphlets, or other written materials from your doctor or pharmacy?: 1 - Never  Interpreter Needed?: No      Activities of Daily Living In your present state of health, do you have any difficulty performing the following activities: 03/06/2020 12/20/2019  Hearing? N N  Vision? N N  Difficulty concentrating or making decisions? N N  Walking or climbing stairs? N N  Dressing or bathing? N N  Doing errands, shopping? N -  Preparing Food and eating ? N -  Using the Toilet? N -  In the past six months, have you accidently leaked urine? N -  Do you have problems with loss of bowel control? N -  Managing your Medications? N -  Managing your Finances? N -  Housekeeping or managing your Housekeeping? N -  Some recent data might be hidden     Patient Care Team: Leone Haven, MD as PCP - General (Family Medicine)  Indicate any recent Medical Services you may have received from other than [facility] providers in the past year (date may be approximate).     Assessment:   This is a routine wellness examination for [place].  I connected with [name] today by telephone and verified that I am speaking with the correct person using two identifiers. Location patient: home Location provider: work Persons participating in the virtual visit: patient, Marine scientist.    I discussed the limitations, risks, security and privacy concerns of performing an evaluation and management service by telephone and the availability of in person appointments.  The patient expressed understanding and verbally consented to this telephonic visit.    Interactive audio and video telecommunications were attempted between this provider and patient, however failed, due to patient having technical difficulties OR patient did not have access to video capability.  We continued and completed visit with audio only.  Some vital signs may be absent or patient reported.   Hearing/Vision screen  Hearing Screening   125Hz  250Hz  500Hz  1000Hz  2000Hz  3000Hz  4000Hz  6000Hz  8000Hz   Right ear:           Left ear:           Comments: Patient is able to hear conversational tones without difficulty.  No issues reported.  Vision Screening Comments: Cataract extraction, bilateral Visual acuity not assessed, virtual visit.  They have seen their ophthalmologist.   Dietary issues and exercise activities discussed: Current Exercise Habits: Home exercise routine, Type of exercise: walking;calisthenics, Time (Minutes): 30, Frequency (Times/Week): 5, Weekly Exercise (Minutes/Week): 150, Intensity: Moderate  Goals      Patient Stated   .  Follow up with Primary Care Provider (pt-stated)      Follow up as needed      Depression Screen PHQ 2/9 Scores 03/06/2020 01/16/2020 10/10/2019 07/06/2019  03/04/2019 05/28/2017  PHQ - 2 Score 0 0 0 0 0 0    Fall Risk Fall Risk  03/06/2020 01/16/2020 10/10/2019 07/06/2019 03/04/2019  Falls in the past year? 0 0 0 0 0  Number falls in past yr: 0 0 0 0 -  Injury with Fall? - - 0 - -  Follow up Falls evaluation completed Falls evaluation completed Falls evaluation completed Falls evaluation completed -   Handrails in use when climbing stairs? Yes  Home free of loose throw rugs in walkways, pet beds, electrical cords, etc? Yes  Adequate lighting in your home to reduce risk of falls? Yes   ASSISTIVE DEVICES UTILIZED TO PREVENT FALLS:  Life alert? No  Use of a cane, walker or w/c? Yes  Grab bars in the bathroom? Yes  Shower chair or bench in shower? Yes  Elevated toilet seat or a handicapped toilet? Yes   TIMED UP AND GO:  Was the test performed? No .   Cognitive Function: MMSE - Mini Mental State Exam 03/06/2020  Not completed: Unable to complete  Patient is alert and oriented x3.  Denies difficulty with concentrating, focusing, memory loss.    6CIT Screen 03/04/2019  What Year? 0 points  What month? 0 points  What time? 0 points  Count back from 20 0 points  Months in reverse 0 points  Repeat phrase 0 points  Total Score 0   Immunizations Immunization History  Administered Date(s) Administered  . Fluad Quad(high Dose 65+) 05/12/2019  . Influenza, High Dose Seasonal PF 05/28/2017, 06/07/2018  . Influenza-Unspecified 04/20/2013, 05/28/2015  . PFIZER SARS-COV-2 Vaccination 03/05/2020  . Td 01/16/2020  . Tdap 12/04/2005   Health Maintenance Health Maintenance  Topic Date Due  . PNA vac Low Risk Adult (1 of 2 - PCV13) Never done  . INFLUENZA VACCINE  03/04/2020  . Hepatitis C Screening  01/15/2021 (Originally 25-Nov-1950)  . COVID-19 Vaccine (2 - Pfizer 2-dose series) 03/26/2020  . MAMMOGRAM  06/26/2020  . COLONOSCOPY  12/23/2021  . TETANUS/TDAP  01/15/2030  . DEXA SCAN  Completed    Dental Screening: Recommended annual dental  exams for proper oral hygiene.   Community Resource Referral / Chronic Care Management: CRR required this visit?  No   CCM  required this visit?  No      Plan:   Keep all routine maintenance appointments.   Follow up 07/17/20 @ 8:00  I have personally reviewed and noted the following in the patient's chart:   . Medical and social history . Use of alcohol, tobacco or illicit drugs  . Current medications and supplements . Functional ability and status . Nutritional status . Physical activity . Advanced directives . List of other physicians . Hospitalizations, surgeries, and ER visits in previous 12 months . Vitals . Screenings to include cognitive, depression, and falls . Referrals and appointments  In addition, I have reviewed and discussed with patient certain preventive protocols, quality metrics, and best practice recommendations. A written personalized care plan for preventive services as well as general preventive health recommendations were provided to patient via mychart.     Varney Biles, LPN   8/0/2233

## 2020-03-06 NOTE — Patient Instructions (Addendum)
Erin [name] , Thank you for taking time to come for your Medicare Wellness Visit. I appreciate your ongoing commitment to your health goals. Please review the following plan we discussed and let me know if I can assist you in the future.   These are the goals we discussed: Goals      Patient Stated   .  Follow up with Primary Care Provider (pt-stated)      Follow up as needed       This is a list of the screening recommended for you and due dates:  Health Maintenance  Topic Date Due  . Pneumonia vaccines (1 of 2 - PCV13) Never done  . Flu Shot  03/04/2020  .  Hepatitis C: One time screening is recommended by Center for Disease Control  (CDC) for  adults born from [birth year] through 1964.   01/15/2021*  . COVID-19 Vaccine (2 - Pfizer 2-dose series) 03/26/2020  . Mammogram  06/26/2020  . Colon Cancer Screening  12/23/2021  . Tetanus Vaccine  01/15/2030  . DEXA scan (bone density measurement)  Completed  *Topic was postponed. The date shown is not the original due date.    Immunizations Immunization History  Administered Date(s) Administered  . Fluad Quad(high Dose 65+) 05/12/2019  . Influenza, High Dose Seasonal PF 05/28/2017, 06/07/2018  . Influenza-Unspecified 04/20/2013, 05/28/2015  . PFIZER SARS-COV-2 Vaccination 03/05/2020  . Td 01/16/2020  . Tdap 12/04/2005   Advanced directives: End of life planning; Advance aging; Advanced directives discussed.  Copy of current HCPOA/Living Will requested.    Conditions/risks identified: none new  Follow up in one year for your annual wellness visit.   Preventive Care 11 Years and Older, Female Preventive care refers to lifestyle choices and visits with your health care provider that can promote health and wellness. What does preventive care include?  A yearly physical exam. This is also called an annual well check.  Dental exams once or twice a year.  Routine eye exams. Ask your health care provider how often you should have your  eyes checked.  Personal lifestyle choices, including:  Daily care of your teeth and gums.  Regular physical activity.  Eating a healthy diet.  Avoiding tobacco and drug use.  Limiting alcohol use.  Practicing safe sex.  Taking low-dose aspirin every day.  Taking vitamin and mineral supplements as recommended by your health care provider. What happens during an annual well check? The services and screenings done by your health care provider during your annual well check will depend on your age, overall health, lifestyle risk factors, and family history of disease. Counseling  Your health care provider may ask you questions about your:  Alcohol use.  Tobacco use.  Drug use.  Emotional well-being.  Home and relationship well-being.  Sexual activity.  Eating habits.  History of falls.  Memory and ability to understand (cognition).  Work and work Statistician.  Reproductive health. Screening  You may have the following tests or measurements:  Height, weight, and BMI.  Blood pressure.  Lipid and cholesterol levels. These may be checked every 5 years, or more frequently if you are over 14 years old.  Skin check.  Lung cancer screening. You may have this screening every year starting at age 24 if you have a 30-pack-year history of smoking and currently smoke or have quit within the past 15 years.  Fecal occult blood test (FOBT) of the stool. You may have this test every year starting at age 43.  Flexible  sigmoidoscopy or colonoscopy. You may have a sigmoidoscopy every 5 years or a colonoscopy every 10 years starting at age 20.  Hepatitis C blood test.  Hepatitis B blood test.  Sexually transmitted disease (STD) testing.  Diabetes screening. This is done by checking your blood sugar (glucose) after you have not eaten for a while (fasting). You may have this done every 1-3 years.  Bone density scan. This is done to screen for osteoporosis. You may have this  done starting at age 72.  Mammogram. This may be done every 1-2 years. Talk to your health care provider about how often you should have regular mammograms. Talk with your health care provider about your test results, treatment options, and if necessary, the need for more tests. Vaccines  Your health care provider may recommend certain vaccines, such as:  Influenza vaccine. This is recommended every year.  Tetanus, diphtheria, and acellular pertussis (Tdap, Td) vaccine. You may need a Td booster every 10 years.  Zoster vaccine. You may need this after age 86.  Pneumococcal 13-valent conjugate (PCV13) vaccine. One dose is recommended after age 34.  Pneumococcal polysaccharide (PPSV23) vaccine. One dose is recommended after age 61. Talk to your health care provider about which screenings and vaccines you need and how often you need them. This information is not intended to replace advice given to you by your health care provider. Make sure you discuss any questions you have with your health care provider. Document Released: 08/17/2015 Document Revised: 04/09/2016 Document Reviewed: 05/22/2015 Elsevier Interactive Patient Education  2017 [place] Prevention in the Home Falls can cause injuries. They can happen to people of all ages. There are many things you can do to make your home safe and to help prevent falls. What can I do on the outside of my home?  Regularly fix the edges of walkways and driveways and fix any cracks.  Remove anything that might make you trip as you walk through a door, such as a raised step or threshold.  Trim any bushes or trees on the path to your home.  Use bright outdoor lighting.  Clear any walking paths of anything that might make someone trip, such as rocks or tools.  Regularly check to see if handrails are loose or broken. Make sure that both sides of any steps have handrails.  Any raised decks and porches should have guardrails on the  edges.  Have any leaves, snow, or ice cleared regularly.  Use sand or salt on walking paths during winter.  Clean up any spills in your garage right away. This includes oil or grease spills. What can I do in the bathroom?  Use night lights.  Install grab bars by the toilet and in the tub and shower. Do not use towel bars as grab bars.  Use non-skid mats or decals in the tub or shower.  If you need to sit down in the shower, use a plastic, non-slip stool.  Keep the floor dry. Clean up any water that spills on the floor as soon as it happens.  Remove soap buildup in the tub or shower regularly.  Attach bath mats securely with double-sided non-slip rug tape.  Do not have throw rugs and other things on the floor that can make you trip. What can I do in the bedroom?  Use night lights.  Make sure that you have a light by your bed that is easy to reach.  Do not use any sheets or blankets that  are too big for your bed. They should not hang down onto the floor.  Have a firm chair that has side arms. You can use this for support while you get dressed.  Do not have throw rugs and other things on the floor that can make you trip. What can I do in the kitchen?  Clean up any spills right away.  Avoid walking on wet floors.  Keep items that you use a lot in easy-to-reach places.  If you need to reach something above you, use a strong step stool that has a grab bar.  Keep electrical cords out of the way.  Do not use floor polish or wax that makes floors slippery. If you must use wax, use non-skid floor wax.  Do not have throw rugs and other things on the floor that can make you trip. What can I do with my stairs?  Do not leave any items on the stairs.  Make sure that there are handrails on both sides of the stairs and use them. Fix handrails that are broken or loose. Make sure that handrails are as long as the stairways.  Check any carpeting to make sure that it is firmly  attached to the stairs. Fix any carpet that is loose or worn.  Avoid having throw rugs at the top or bottom of the stairs. If you do have throw rugs, attach them to the floor with carpet tape.  Make sure that you have a light switch at the top of the stairs and the bottom of the stairs. If you do not have them, ask someone to add them for you. What else can I do to help prevent falls?  Wear shoes that:  Do not have high heels.  Have rubber bottoms.  Are comfortable and fit you well.  Are closed at the toe. Do not wear sandals.  If you use a stepladder:  Make sure that it is fully opened. Do not climb a closed stepladder.  Make sure that both sides of the stepladder are locked into place.  Ask someone to hold it for you, if possible.  Clearly mark and make sure that you can see:  Any grab bars or handrails.  First and last steps.  Where the edge of each step is.  Use tools that help you move around (mobility aids) if they are needed. These include:  Canes.  Walkers.  Scooters.  Crutches.  Turn on the lights when you go into a dark area. Replace any light bulbs as soon as they burn out.  Set up your furniture so you have a clear path. Avoid moving your furniture around.  If any of your floors are uneven, fix them.  If there are any pets around you, be aware of where they are.  Review your medicines with your doctor. Some medicines can make you feel dizzy. This can increase your chance of falling. Ask your doctor what other things that you can do to help prevent falls. This information is not intended to replace advice given to you by your health care provider. Make sure you discuss any questions you have with your health care provider. Document Released: 05/17/2009 Document Revised: 12/27/2015 Document Reviewed: 08/25/2014 Elsevier Interactive Patient Education  2017 [employer].

## 2020-04-03 ENCOUNTER — Encounter: Payer: Self-pay | Admitting: Family Medicine

## 2020-04-04 ENCOUNTER — Encounter: Payer: Self-pay | Admitting: Family Medicine

## 2020-04-05 ENCOUNTER — Encounter: Payer: Self-pay | Admitting: Family Medicine

## 2020-04-05 NOTE — Telephone Encounter (Signed)
Please see more recent MyChart message.

## 2020-04-18 ENCOUNTER — Other Ambulatory Visit: Payer: Medicare HMO

## 2020-04-18 ENCOUNTER — Other Ambulatory Visit: Payer: Self-pay

## 2020-04-18 DIAGNOSIS — Z20822 Contact with and (suspected) exposure to covid-19: Secondary | ICD-10-CM | POA: Diagnosis not present

## 2020-04-19 LAB — NOVEL CORONAVIRUS, NAA: SARS-CoV-2, NAA: NOT DETECTED

## 2020-04-19 LAB — SARS-COV-2, NAA 2 DAY TAT

## 2020-05-04 NOTE — Telephone Encounter (Signed)
Lab results given to patient.  Priti Consoli,cma

## 2020-05-08 ENCOUNTER — Encounter: Payer: Self-pay | Admitting: Family Medicine

## 2020-05-12 ENCOUNTER — Other Ambulatory Visit: Payer: Self-pay | Admitting: Family Medicine

## 2020-05-21 ENCOUNTER — Encounter: Payer: Self-pay | Admitting: Family Medicine

## 2020-05-21 MED ORDER — AMLODIPINE BESYLATE 10 MG PO TABS: 10.0000 mg | ORAL_TABLET | Freq: Every day | ORAL | 1 refills | Status: DC

## 2020-06-19 ENCOUNTER — Encounter: Payer: Self-pay | Admitting: Family Medicine

## 2020-06-19 DIAGNOSIS — Z1231 Encounter for screening mammogram for malignant neoplasm of breast: Secondary | ICD-10-CM

## 2020-06-21 ENCOUNTER — Other Ambulatory Visit: Payer: Self-pay | Admitting: Family Medicine

## 2020-06-25 ENCOUNTER — Ambulatory Visit (INDEPENDENT_AMBULATORY_CARE_PROVIDER_SITE_OTHER): Payer: Medicare HMO

## 2020-06-25 ENCOUNTER — Other Ambulatory Visit: Payer: Self-pay

## 2020-06-25 DIAGNOSIS — Z23 Encounter for immunization: Secondary | ICD-10-CM | POA: Diagnosis not present

## 2020-07-09 ENCOUNTER — Other Ambulatory Visit: Payer: Self-pay

## 2020-07-09 ENCOUNTER — Ambulatory Visit
Admission: RE | Admit: 2020-07-09 | Discharge: 2020-07-09 | Disposition: A | Discharge status: Home / Self Care | Payer: Medicare HMO | Source: Ambulatory Visit | Attending: Family Medicine | Admitting: Family Medicine

## 2020-07-09 DIAGNOSIS — Z1231 Encounter for screening mammogram for malignant neoplasm of breast: Secondary | ICD-10-CM | POA: Diagnosis not present

## 2020-07-17 ENCOUNTER — Encounter: Payer: Self-pay | Admitting: Family Medicine

## 2020-07-17 ENCOUNTER — Other Ambulatory Visit: Payer: Self-pay

## 2020-07-17 ENCOUNTER — Telehealth (INDEPENDENT_AMBULATORY_CARE_PROVIDER_SITE_OTHER): Payer: Medicare HMO | Admitting: Family Medicine

## 2020-07-17 DIAGNOSIS — F4323 Adjustment disorder with mixed anxiety and depressed mood: Secondary | ICD-10-CM

## 2020-07-17 DIAGNOSIS — I10 Essential (primary) hypertension: Secondary | ICD-10-CM

## 2020-07-17 DIAGNOSIS — M8580 Other specified disorders of bone density and structure, unspecified site: Secondary | ICD-10-CM | POA: Diagnosis not present

## 2020-07-17 DIAGNOSIS — E039 Hypothyroidism, unspecified: Secondary | ICD-10-CM

## 2020-07-17 DIAGNOSIS — R69 Illness, unspecified: Secondary | ICD-10-CM | POA: Diagnosis not present

## 2020-07-17 DIAGNOSIS — E782 Mixed hyperlipidemia: Secondary | ICD-10-CM | POA: Diagnosis not present

## 2020-07-17 NOTE — Progress Notes (Signed)
Virtual Visit via telephoneNote  This visit type was conducted due to national recommendations for restrictions regarding the COVID-19 pandemic (e.g. social distancing).  This format is felt to be most appropriate for this patient at this time.  All issues noted in this document were discussed and addressed.  No physical exam was performed (except for noted visual exam findings with Video Visits).   I connected with Erin [name] today at  8:00 AM EST by telephone and verified that I am speaking with the correct person using two identifiers. Location patient: home Location provider: home office Persons participating in the virtual visit: patient, provider  I discussed the limitations, risks, security and privacy concerns of performing an evaluation and management service by telephone and the availability of in person appointments. I also discussed with the patient that there may be a patient responsible charge related to this service. The patient expressed understanding and agreed to proceed.  Interactive audio and video telecommunications were attempted between this provider and patient, however failed, due to patient having technical difficulties OR patient did not have access to video capability.  We continued and completed visit with audio only.   Reason for visit: f/u  HPI: HYPOTHYROIDISM Disease Monitoring Skin Changes: no  Heat/Cold intolerance: no  Medication Monitoring Compliance:  Taking synthroid   Last TSH:   Lab Results  Component Value Date   TSH 1.58 01/16/2020  She is still following up with her endocrinologist. She see's them next month.   HYPERTENSION  Disease Monitoring  Home BP Monitoring 115-120/78 Chest pain- no    Dyspnea- no Medications  Compliance-  Taking amlodipine 10 mg daily.  Edema- no Still walking a couple of miles per day. Eating healthier as well.   Anxiety: notes this time of year is hard for her. She notes she is dealing with this fairly well.  She is staying active and keeping her mind positive. She remains on amitriptyline.   Osteopenia: she has been seeing a chiropractor for years and notes they have her on a biodensity program to strengthen her bones. She is on vitamin D and calcium.       ROS: See pertinent positives and negatives per HPI.  Past Medical History:  Diagnosis Date  . Cancer [facility]) 2011   thyroid, Dr. Carlis Abbott  . GERD (gastroesophageal reflux disease)   . Hyperlipidemia    Hx  . Hypertension   . PONV (postoperative nausea and vomiting)   . Thyroid disease     Past Surgical History:  Procedure Laterality Date  . CATARACT EXTRACTION W/PHACO Right 11/29/2019   Procedure: CATARACT EXTRACTION PHACO AND INTRAOCULAR LENS PLACEMENT ([place]) RIGHT VIVITY TORIC LENS 8.80 00:45.2 ;  Surgeon: Birder Robson, MD;  Location: [place];  Service: Ophthalmology;  Laterality: Right;  . CATARACT EXTRACTION W/PHACO Left 12/20/2019   Procedure: CATARACT EXTRACTION PHACO AND INTRAOCULAR LENS PLACEMENT ([facility]) LEFT VIVITY TORIC LENS 7.51  00:43.7;  Surgeon: Birder Robson, MD;  Location: [place];  Service: Ophthalmology;  Laterality: Left;  . CHOLECYSTECTOMY    . COLONOSCOPY WITH PROPOFOL N/A 12/23/2016   Procedure: COLONOSCOPY WITH PROPOFOL;  Surgeon: Lollie Sails, MD;  Location: [facility] ENDOSCOPY;  Service: Endoscopy;  Laterality: N/A;  . GALLBLADDER SURGERY  2000  . KNEE SURGERY  2003/ 2010   right knee x2 Dr. Marry Guan for torn meniscus  . [place]   right, arthritis  . THYROIDECTOMY  2011   right side    Family History  Problem Relation Age of Onset  . Stroke Mother   . Heart disease Father   . Heart attack Father   . Cancer Maternal Grandmother        pancreatic  . Breast cancer Neg Hx     SOCIAL HX: nonsmoker   Current Outpatient Medications:  .  amitriptyline (ELAVIL) 50 MG tablet, TAKE 1 TABLET BY MOUTH EVERYDAY AT BEDTIME, Disp: 90 tablet, Rfl: 1 .  amLODipine  (NORVASC) 10 MG tablet, Take 1 tablet (10 mg total) by mouth daily., Disp: 90 tablet, Rfl: 1 .  Calcium Carb-Cholecalciferol (CALCIUM 1000 + D PO), Take by mouth., Disp: , Rfl:  .  cholecalciferol (VITAMIN D) 1000 UNITS tablet, Take 1,000 Units by mouth daily., Disp: , Rfl:  .  fluticasone (FLONASE) 50 MCG/ACT nasal spray, Place 2 sprays into both nostrils daily., Disp: 16 g, Rfl: 6 .  levothyroxine (SYNTHROID) 75 MCG tablet, TAKE 1 TABLET (75 MCG TOTAL) BY MOUTH DAILY BEFORE BREAKFAST., Disp: 90 tablet, Rfl: 3 .  Multiple Vitamins-Minerals (CENTRUM SILVER PO), Take by mouth., Disp: , Rfl:  .  NON FORMULARY, goli gummies - apple cider gummies takes 2-3 gummies once daily, Disp: , Rfl:  .  omeprazole (PRILOSEC) 20 MG capsule, TAKE 1 CAPSULE BY MOUTH EVERY DAY, Disp: 90 capsule, Rfl: 2  EXAM: This was a telephone visit and thus no physical exam was completed.   ASSESSMENT AND PLAN:  Discussed the following assessment and plan:  Problem List Items Addressed This Visit    Adjustment disorder with mixed anxiety and depressed mood    Relatively stable. She will continue to stay active. She will continue amitriptyline. She will let us know if she has any worsening symptoms.       Hyperlipidemia    Check lipid panel. Continue diet and exercise.       Relevant Orders   Lipid panel   Hypertension    Well controlled. Continue amlodipine 10 mg daily. Continue healthy diet and exercise.      Hypothyroidism    Continue synthroid 75 mcg daily. Patient will see endocrinology as planned. Defer TSH to them.       Osteopenia    Check Dexa scan. Continue vitamin D and calcium.       Relevant Orders   DG Bone Density       I discussed the assessment and treatment plan with the patient. The patient was provided an opportunity to ask questions and all were answered. The patient agreed with the plan and demonstrated an understanding of the instructions.   The patient was advised to call back or  seek an in-person evaluation if the symptoms worsen or if the condition fails to improve as anticipated.  I provided 10 minutes of non-face-to-face time during this encounter.   [name], MD

## 2020-07-17 NOTE — Assessment & Plan Note (Signed)
Relatively stable. She will continue to stay active. She will continue amitriptyline. She will let us know if she has any worsening symptoms.

## 2020-07-17 NOTE — Assessment & Plan Note (Signed)
Continue synthroid 75 mcg daily. Patient will see endocrinology as planned. Defer TSH to them.

## 2020-07-17 NOTE — Assessment & Plan Note (Signed)
Check Dexa scan. Continue vitamin D and calcium.

## 2020-07-17 NOTE — Assessment & Plan Note (Signed)
Well controlled. Continue amlodipine 10 mg daily. Continue healthy diet and exercise.

## 2020-07-17 NOTE — Assessment & Plan Note (Signed)
Check lipid panel.  Continue diet and exercise. ?

## 2020-07-24 ENCOUNTER — Other Ambulatory Visit (INDEPENDENT_AMBULATORY_CARE_PROVIDER_SITE_OTHER): Payer: Medicare HMO

## 2020-07-24 ENCOUNTER — Other Ambulatory Visit: Payer: Self-pay

## 2020-07-24 DIAGNOSIS — E782 Mixed hyperlipidemia: Secondary | ICD-10-CM

## 2020-07-24 LAB — LIPID PANEL
Cholesterol: 185 mg/dL (ref 0–200)
HDL: 53.2 mg/dL (ref >39.00)
LDL Cholesterol: 111 mg/dL — ABNORMAL HIGH (ref 0–99)
NonHDL: 131.75
Total CHOL/HDL Ratio: 3
Triglycerides: 105 mg/dL (ref 0.0–149.0)
VLDL: 21 mg/dL (ref 0.0–40.0)

## 2020-07-30 ENCOUNTER — Telehealth: Payer: Self-pay

## 2020-07-30 NOTE — Telephone Encounter (Signed)
-----   Message from Glori Luis, MD sent at 07/30/2020 10:29 AM EST ----- Please let the patient know that her LDL cholesterol has improved though is still not at quite at goal. She could continue with diet and exercise if she would like or we could add a statin such as crestor to get her LDL to <100.

## 2020-08-08 DIAGNOSIS — H43813 Vitreous degeneration, bilateral: Secondary | ICD-10-CM | POA: Diagnosis not present

## 2020-08-15 ENCOUNTER — Encounter: Payer: Self-pay | Admitting: Family Medicine

## 2020-08-22 DIAGNOSIS — E890 Postprocedural hypothyroidism: Secondary | ICD-10-CM | POA: Diagnosis not present

## 2020-08-22 DIAGNOSIS — E063 Autoimmune thyroiditis: Secondary | ICD-10-CM | POA: Diagnosis not present

## 2020-08-22 DIAGNOSIS — E785 Hyperlipidemia, unspecified: Secondary | ICD-10-CM | POA: Diagnosis not present

## 2020-08-22 DIAGNOSIS — K229 Disease of esophagus, unspecified: Secondary | ICD-10-CM | POA: Diagnosis not present

## 2020-08-22 DIAGNOSIS — C73 Malignant neoplasm of thyroid gland: Secondary | ICD-10-CM | POA: Diagnosis not present

## 2020-08-25 ENCOUNTER — Other Ambulatory Visit: Payer: Self-pay | Admitting: Family Medicine

## 2020-08-31 DIAGNOSIS — E063 Autoimmune thyroiditis: Secondary | ICD-10-CM | POA: Diagnosis not present

## 2020-08-31 DIAGNOSIS — E785 Hyperlipidemia, unspecified: Secondary | ICD-10-CM | POA: Diagnosis not present

## 2020-08-31 DIAGNOSIS — K229 Disease of esophagus, unspecified: Secondary | ICD-10-CM | POA: Diagnosis not present

## 2020-08-31 DIAGNOSIS — C73 Malignant neoplasm of thyroid gland: Secondary | ICD-10-CM | POA: Diagnosis not present

## 2020-10-01 DIAGNOSIS — Z0100 Encounter for examination of eyes and vision without abnormal findings: Secondary | ICD-10-CM | POA: Diagnosis not present

## 2020-10-10 DIAGNOSIS — L821 Other seborrheic keratosis: Secondary | ICD-10-CM | POA: Diagnosis not present

## 2020-10-10 DIAGNOSIS — D2272 Melanocytic nevi of left lower limb, including hip: Secondary | ICD-10-CM | POA: Diagnosis not present

## 2020-10-10 DIAGNOSIS — L570 Actinic keratosis: Secondary | ICD-10-CM | POA: Diagnosis not present

## 2020-10-10 DIAGNOSIS — D2261 Melanocytic nevi of right upper limb, including shoulder: Secondary | ICD-10-CM | POA: Diagnosis not present

## 2020-10-10 DIAGNOSIS — D2262 Melanocytic nevi of left upper limb, including shoulder: Secondary | ICD-10-CM | POA: Diagnosis not present

## 2020-10-10 DIAGNOSIS — Z85828 Personal history of other malignant neoplasm of skin: Secondary | ICD-10-CM | POA: Diagnosis not present

## 2020-10-10 DIAGNOSIS — D225 Melanocytic nevi of trunk: Secondary | ICD-10-CM | POA: Diagnosis not present

## 2020-10-30 DIAGNOSIS — M722 Plantar fascial fibromatosis: Secondary | ICD-10-CM | POA: Diagnosis not present

## 2020-10-30 DIAGNOSIS — M79671 Pain in right foot: Secondary | ICD-10-CM | POA: Diagnosis not present

## 2020-10-31 ENCOUNTER — Other Ambulatory Visit: Payer: Self-pay | Admitting: Family Medicine

## 2020-11-07 ENCOUNTER — Other Ambulatory Visit: Payer: Self-pay

## 2020-11-07 ENCOUNTER — Ambulatory Visit
Admission: RE | Admit: 2020-11-07 | Discharge: 2020-11-07 | Disposition: A | Discharge status: Home / Self Care | Payer: Medicare HMO | Source: Ambulatory Visit | Attending: Family Medicine | Admitting: Family Medicine

## 2020-11-07 DIAGNOSIS — M8580 Other specified disorders of bone density and structure, unspecified site: Secondary | ICD-10-CM | POA: Diagnosis not present

## 2020-11-07 DIAGNOSIS — Z13820 Encounter for screening for osteoporosis: Secondary | ICD-10-CM | POA: Diagnosis not present

## 2020-11-07 DIAGNOSIS — M85852 Other specified disorders of bone density and structure, left thigh: Secondary | ICD-10-CM | POA: Diagnosis not present

## 2020-11-07 DIAGNOSIS — Z780 Asymptomatic menopausal state: Secondary | ICD-10-CM | POA: Insufficient documentation

## 2020-11-07 DIAGNOSIS — M8589 Other specified disorders of bone density and structure, multiple sites: Secondary | ICD-10-CM | POA: Insufficient documentation

## 2020-11-13 ENCOUNTER — Encounter: Payer: Self-pay | Admitting: Family Medicine

## 2020-11-13 DIAGNOSIS — E039 Hypothyroidism, unspecified: Secondary | ICD-10-CM

## 2020-11-13 NOTE — Telephone Encounter (Signed)
Patient say she is having no chest pain just that she does not feel right her pulse is running about 80 at rest and BP is good 119/83, she says that she does have times she feels either extremely cold or extremely hot, she said this was what it was like the last time her thyroid was acting up and medication needed to be adjusted.  No appointments available and patient does not want to be seen at [facility] , she said she would go to ER or UC if she developed chest pain or pain in her jaw line or down her arm.  Please advise where to schedule there is one appt acute on 4/20?

## 2020-11-13 NOTE — Telephone Encounter (Signed)
Tried to reach patient for triage no answer left message to call office.

## 2020-11-14 ENCOUNTER — Other Ambulatory Visit (INDEPENDENT_AMBULATORY_CARE_PROVIDER_SITE_OTHER): Payer: Medicare HMO

## 2020-11-14 ENCOUNTER — Other Ambulatory Visit: Payer: Self-pay

## 2020-11-14 DIAGNOSIS — E039 Hypothyroidism, unspecified: Secondary | ICD-10-CM | POA: Diagnosis not present

## 2020-11-14 LAB — TSH: TSH: 0.99 u[IU]/mL (ref 0.35–4.50)

## 2020-11-14 NOTE — Telephone Encounter (Signed)
4/20 would be fine. She can come in for a TSH prior to that. I placed an order.

## 2020-11-14 NOTE — Progress Notes (Signed)
Patient was given her bone density results and wanted a copy to pick up.  Dariane Natzke,cma

## 2020-11-20 DIAGNOSIS — M722 Plantar fascial fibromatosis: Secondary | ICD-10-CM | POA: Diagnosis not present

## 2020-11-23 ENCOUNTER — Other Ambulatory Visit: Payer: Self-pay

## 2020-11-26 ENCOUNTER — Ambulatory Visit: Payer: Medicare HMO | Admitting: Family Medicine

## 2020-11-29 ENCOUNTER — Other Ambulatory Visit: Payer: Self-pay

## 2020-11-29 ENCOUNTER — Encounter: Payer: Self-pay | Admitting: Family Medicine

## 2020-11-29 ENCOUNTER — Ambulatory Visit (INDEPENDENT_AMBULATORY_CARE_PROVIDER_SITE_OTHER): Payer: Medicare HMO | Admitting: Family Medicine

## 2020-11-29 DIAGNOSIS — R69 Illness, unspecified: Secondary | ICD-10-CM | POA: Diagnosis not present

## 2020-11-29 DIAGNOSIS — E039 Hypothyroidism, unspecified: Secondary | ICD-10-CM | POA: Diagnosis not present

## 2020-11-29 DIAGNOSIS — E782 Mixed hyperlipidemia: Secondary | ICD-10-CM | POA: Diagnosis not present

## 2020-11-29 DIAGNOSIS — F4323 Adjustment disorder with mixed anxiety and depressed mood: Secondary | ICD-10-CM

## 2020-11-29 DIAGNOSIS — M8580 Other specified disorders of bone density and structure, unspecified site: Secondary | ICD-10-CM

## 2020-11-29 NOTE — Assessment & Plan Note (Signed)
The patient has declined medication.  She will continue vitamin D and calcium.  She will try to do weightbearing exercises to help build her bones.

## 2020-11-29 NOTE — Progress Notes (Signed)
[name], MD Phone: [phone]  Erin [name] is a 70 y.o. female who presents today for same day visit.   Anxiety: Patient notes several weeks ago she was having issues where she had an odd sensation in her upper chest and neck.  There was no chest pain or pressure.  No palpitations.  No shortness of breath.  No nausea, vomiting, diarrhea, abdominal pain, cough, congestion, or fevers.  She has no depression.  She notes this felt similar to the last time her thyroid function was off or when her blood pressure has been elevated.  Her TSH was checked and it was acceptable.  She was checking her blood pressure at home and this was acceptable as well.  She thought that maybe it was anxiety.  She did note it would improve if she went and exercised or worked outside in the yard.  This occurred off and on for several days and then just resolved on its own.  Hypothyroidism: Currently taking Synthroid 75 mcg once daily.  Current TSH acceptable.  Hyperlipidemia: She has declined cholesterol medication in the past.  She wonders if there is an over-the-counter supplement she can take.  Osteoporosis: Patient has a diagnosis of osteoporosis based on FRAX scoring.  She does not want medication for this at this time.  She notes she has been doing exercises through her chiropractor that is trying to help build her bones.  Social History   Tobacco Use  Smoking Status Never Smoker  Smokeless Tobacco Never Used    Current Outpatient Medications on File Prior to Visit  Medication Sig Dispense Refill  . amitriptyline (ELAVIL) 50 MG tablet TAKE 1 TABLET BY MOUTH EVERYDAY AT BEDTIME 90 tablet 1  . amLODipine (NORVASC) 10 MG tablet TAKE 1 TABLET BY MOUTH EVERY DAY 90 tablet 1  . Calcium Carb-Cholecalciferol (CALCIUM 1000 + D PO) Take by mouth.    . cholecalciferol (VITAMIN D) 1000 UNITS tablet Take 1,000 Units by mouth daily.    . fluticasone (FLONASE) 50 MCG/ACT nasal spray Place 2 sprays into both  nostrils daily. 16 g 6  . levothyroxine (SYNTHROID) 75 MCG tablet TAKE 1 TABLET (75 MCG TOTAL) BY MOUTH DAILY BEFORE BREAKFAST. 90 tablet 3  . Multiple Vitamins-Minerals (CENTRUM SILVER PO) Take by mouth.    . NON FORMULARY goli gummies - apple cider gummies takes 2-3 gummies once daily    . omeprazole (PRILOSEC) 20 MG capsule TAKE 1 CAPSULE BY MOUTH EVERY DAY 90 capsule 2   No current facility-administered medications on file prior to visit.     ROS see history of present illness  Objective  Physical Exam Vitals:   11/29/20 0816  BP: 118/78  Pulse: 74  Temp: (!) 96.2 F (35.7 C)  SpO2: 99%    BP Readings from Last 3 Encounters:  11/29/20 118/78  01/16/20 110/60  12/20/19 112/86   Wt Readings from Last 3 Encounters:  11/29/20 125 lb 6.4 oz (56.9 kg)  07/17/20 119 lb (54 kg)  03/06/20 119 lb (54 kg)    Physical Exam Constitutional:      General: She is not in acute distress.    Appearance: She is not diaphoretic.  Neck:     Thyroid: No thyroid mass.  Cardiovascular:     Rate and Rhythm: Normal rate and regular rhythm.     Heart sounds: Normal heart sounds.  Pulmonary:     Effort: Pulmonary effort is normal.     Breath sounds: Normal breath sounds.  Skin:    General: Skin is warm and dry.  Neurological:     Mental Status: She is alert.      Assessment/Plan: Please see individual problem list.  Problem List Items Addressed This Visit    Hyperlipidemia    The patient has declined medication.  I discussed red yeast rice would be an option for over-the-counter treatment.  Discussed if she started on that she should let us know so we could check labs 6 weeks later.      Hypothyroidism    Adequately controlled on Synthroid 75 mcg once daily.  She will continue this.      Osteopenia    The patient has declined medication.  She will continue vitamin D and calcium.  She will try to do weightbearing exercises to help build her bones.      Adjustment disorder  with mixed anxiety and depressed mood    Denies depression.  Recent episode could have been related to anxiety.  It does not seem consistent with a cardiac cause.  Her thyroid function was acceptable.  Her blood pressure has been good.  She does not want additional medication for anxiety and I do not think this warrants that.  She will monitor for recurrence and let us know if it does recur.        This visit occurred during the SARS-CoV-2 public health emergency.  Safety protocols were in place, including screening questions prior to the visit, additional usage of staff PPE, and extensive cleaning of exam room while observing appropriate contact time as indicated for disinfecting solutions.    [name], MD [place]

## 2020-11-29 NOTE — Assessment & Plan Note (Signed)
Denies depression.  Recent episode could have been related to anxiety.  It does not seem consistent with a cardiac cause.  Her thyroid function was acceptable.  Her blood pressure has been good.  She does not want additional medication for anxiety and I do not think this warrants that.  She will monitor for recurrence and let us know if it does recur.

## 2020-11-29 NOTE — Assessment & Plan Note (Signed)
The patient has declined medication.  I discussed red yeast rice would be an option for over-the-counter treatment.  Discussed if she started on that she should let us know so we could check labs 6 weeks later.

## 2020-11-29 NOTE — Patient Instructions (Signed)
Nice to see you. Please let us know if you decide to start on red yeast rice. If you have recurrent anxiety issues please let us know as well.

## 2020-11-29 NOTE — Assessment & Plan Note (Signed)
Adequately controlled on Synthroid 75 mcg once daily.  She will continue this.

## 2021-01-21 IMAGING — MG DIGITAL SCREENING BILAT W/ TOMO W/ CAD
8 series · 8 of 24 positions shown · non-contrast
Comparison: Previous exam(s).

CLINICAL DATA: Screening.

EXAM:
DIGITAL SCREENING BILATERAL MAMMOGRAM WITH TOMO AND CAD

[R CC synth-2D]
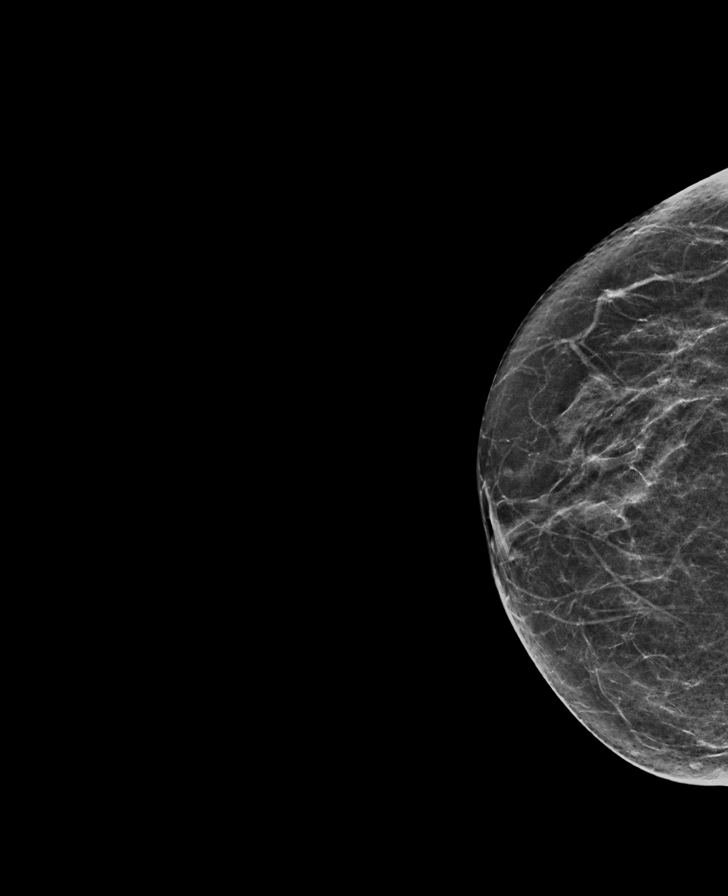

[R MLO synth-2D]
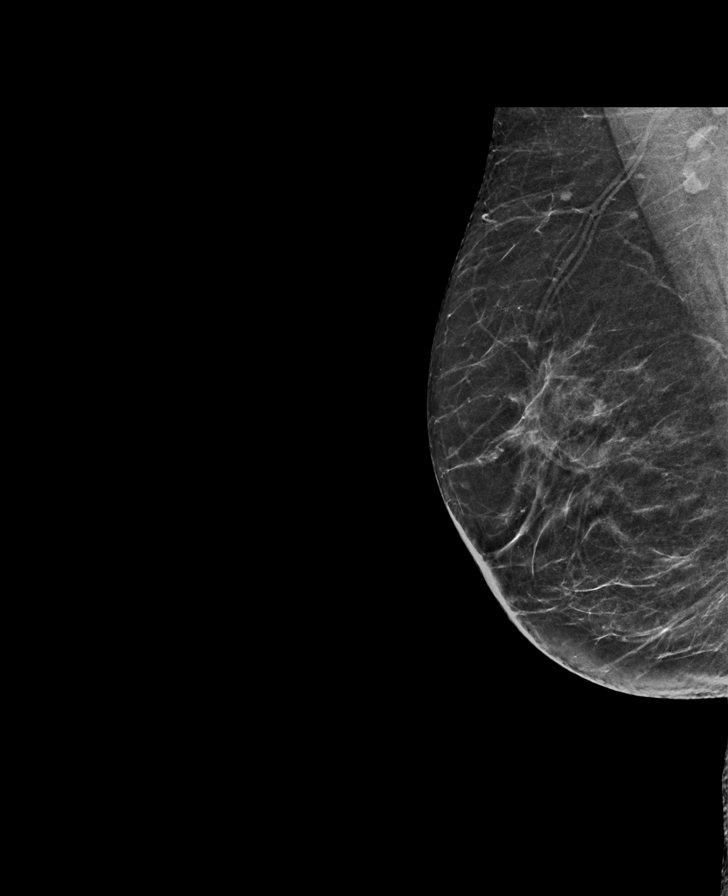

[L MLO synth-2D]
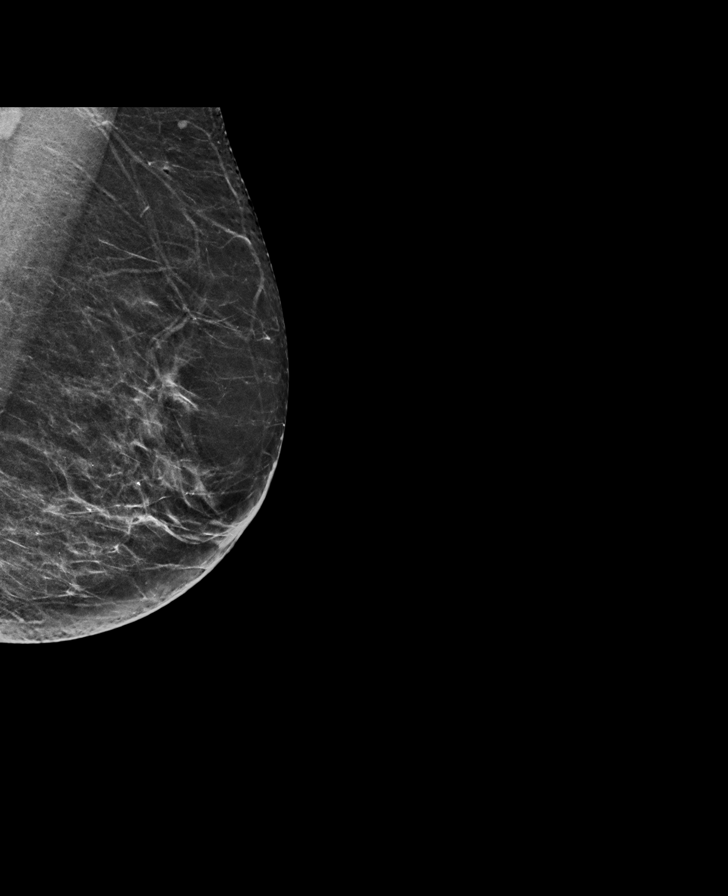

[L CC synth-2D]
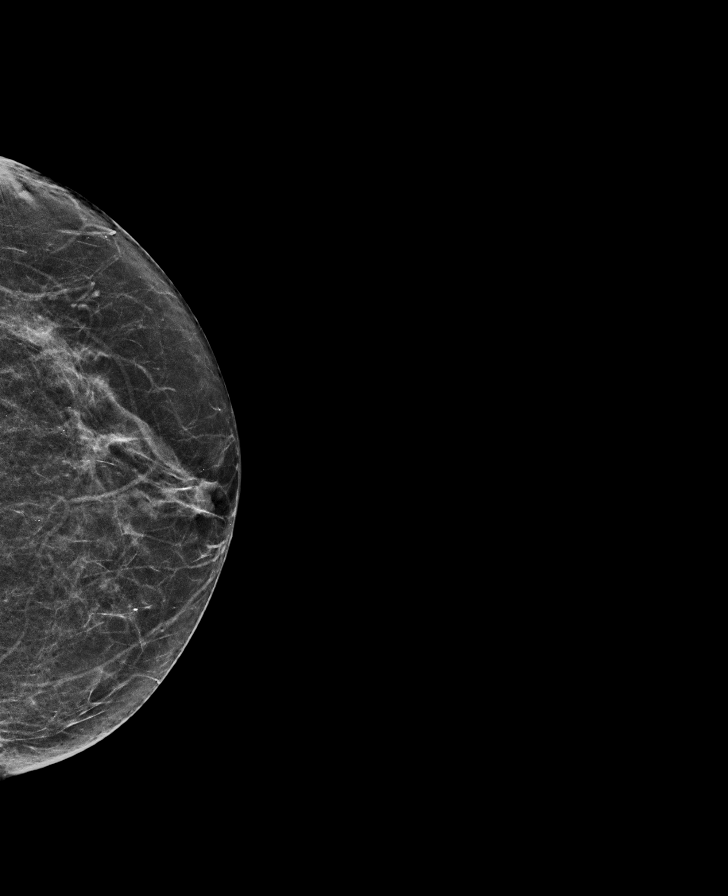

[R CC tomo · tomo slice 29/58.0]
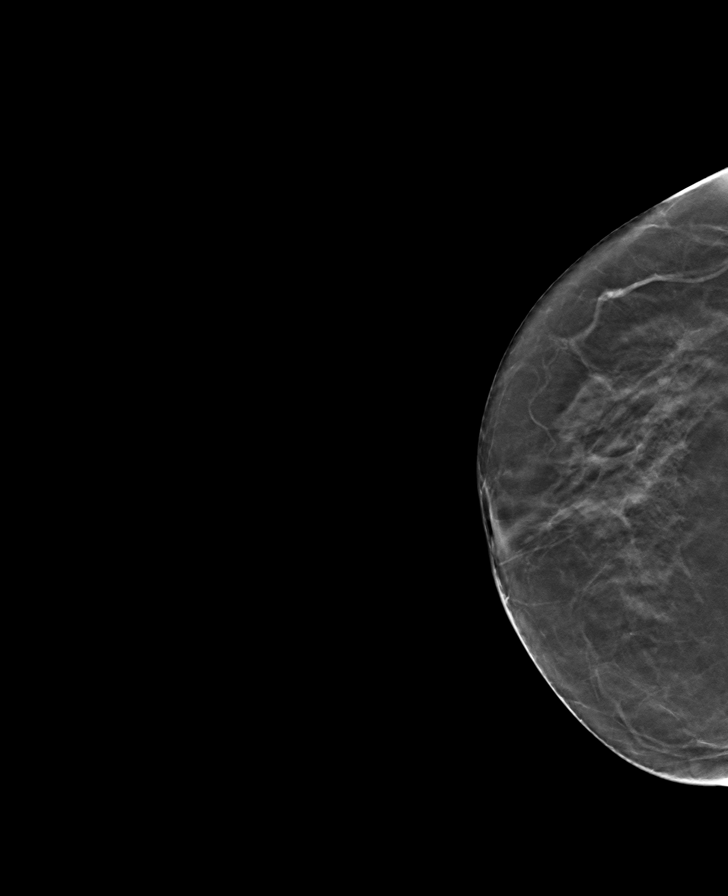

[L MLO tomo · tomo slice 33/65.0]
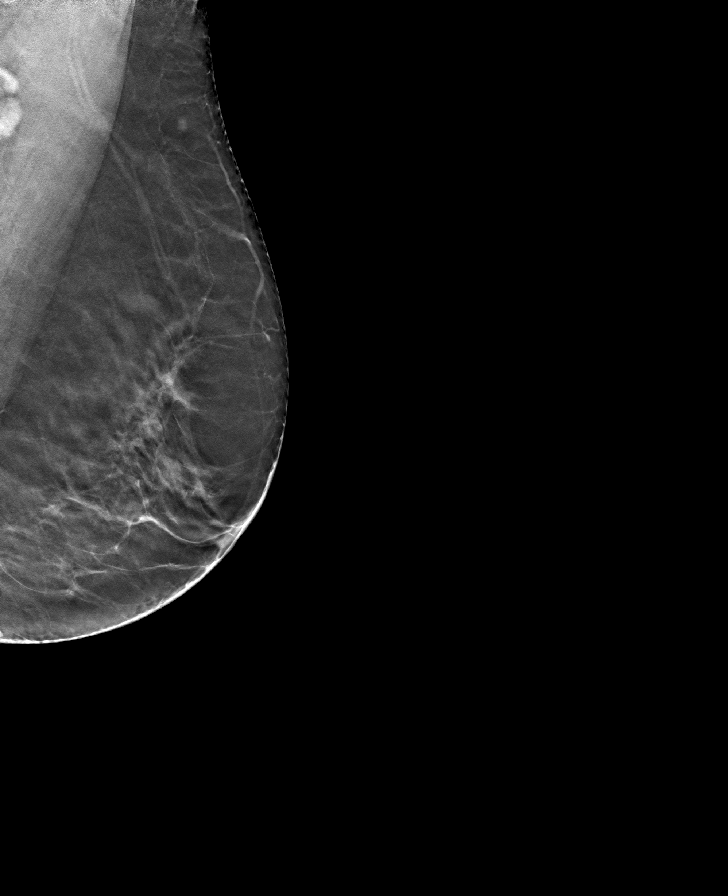

[R MLO tomo · tomo slice 31/62.0]
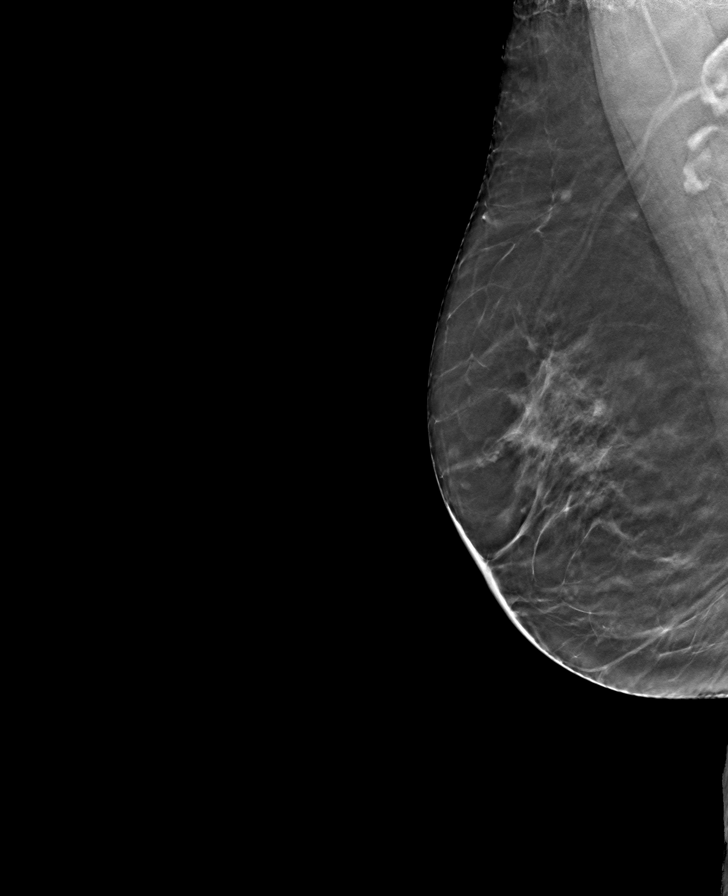

[L CC tomo · tomo slice 28/55.0]
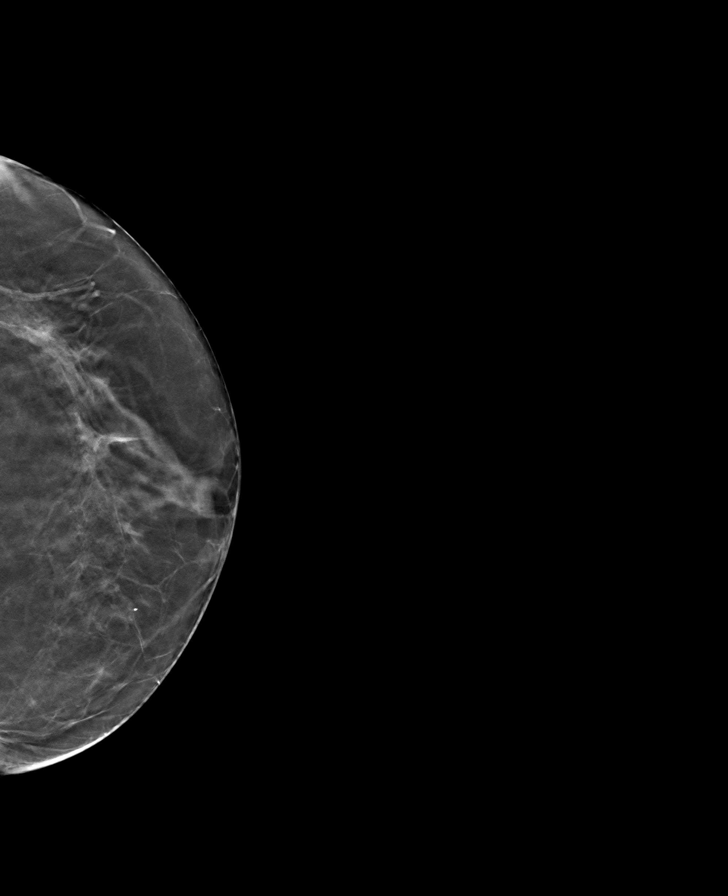

[8 of 24 positions shown; findings below may reference images not displayed]

ACR Breast Density Category b: There are scattered areas of
fibroglandular density.
FINDINGS: There are no findings suspicious for malignancy. Images were
processed with CAD.
IMPRESSION: No mammographic evidence of malignancy. A result letter of this
screening mammogram will be mailed directly to the patient.

RECOMMENDATION:
Screening mammogram in one year. (Code:[id number])

BI-RADS CATEGORY  1: Negative.

## 2021-01-22 ENCOUNTER — Encounter: Payer: Self-pay | Admitting: Family Medicine

## 2021-01-22 NOTE — Telephone Encounter (Signed)
Patient scheduled for virtual visit for 8:30 am.

## 2021-01-22 NOTE — Telephone Encounter (Signed)
Please advise 

## 2021-01-23 ENCOUNTER — Telehealth: Payer: Medicare HMO | Admitting: Family Medicine

## 2021-01-28 ENCOUNTER — Ambulatory Visit (INDEPENDENT_AMBULATORY_CARE_PROVIDER_SITE_OTHER): Payer: Medicare HMO | Admitting: Family Medicine

## 2021-01-28 ENCOUNTER — Other Ambulatory Visit: Payer: Self-pay

## 2021-01-28 DIAGNOSIS — R195 Other fecal abnormalities: Secondary | ICD-10-CM | POA: Diagnosis not present

## 2021-01-28 NOTE — Assessment & Plan Note (Signed)
The patient has had 2 occurrences of loose stools in the last 2 weeks.  I discussed this could be related to a viral illness or food issue.  Subsequently ended up being constipated though that seems to have improved today.  Discussed she could use Benefiber daily and use a stool softener occasionally to help with constipation.  I advised at this point we will just monitor and she should contact us if she has recurrent loose stools or if the constipation persists.

## 2021-01-28 NOTE — Progress Notes (Signed)
Virtual Visit via telephone Note  This visit type was conducted due to national recommendations for restrictions regarding the COVID-19 pandemic (e.g. social distancing).  This format is felt to be most appropriate for this patient at this time.  All issues noted in this document were discussed and addressed.  No physical exam was performed (except for noted visual exam findings with Video Visits).   I connected with Erin [name] today at  3:15 PM EDT by telephone and verified that I am speaking with the correct person using two identifiers. Location patient: home Location provider: work Persons participating in the virtual visit: patient, provider  I discussed the limitations, risks, security and privacy concerns of performing an evaluation and management service by telephone and the availability of in person appointments. I also discussed with the patient that there may be a patient responsible charge related to this service. The patient expressed understanding and agreed to proceed.  Interactive audio and video telecommunications were attempted between this provider and patient, however failed, due to patient having technical difficulties OR patient did not have access to video capability.  We continued and completed visit with audio only.   Reason for visit: Same-day visit.  HPI: Loose stools: Patient notes 2 weeks ago she had 1 day of loose stools that occurred 3 times and then was done.  It recurred last week on Monday and then stopped.  She then became constipated with having small bowel movements and some straining.  She took a stool softener and started Benefiber and had a good bowel movement today.  She did have a little abdominal discomfort with the loose stools though otherwise no abdominal pain.  No blood in her stool.  No fevers.   ROS: See pertinent positives and negatives per HPI.  Past Medical History:  Diagnosis Date   Cancer [facility]) 2011   thyroid, Dr. Carlis Abbott   GERD  (gastroesophageal reflux disease)    Hyperlipidemia    Hx   Hypertension    PONV (postoperative nausea and vomiting)    Thyroid disease     Past Surgical History:  Procedure Laterality Date   CATARACT EXTRACTION W/PHACO Right 11/29/2019   Procedure: CATARACT EXTRACTION PHACO AND INTRAOCULAR LENS PLACEMENT ([place]) RIGHT VIVITY TORIC LENS 8.80 00:45.2 ;  Surgeon: Birder Robson, MD;  Location: [place];  Service: Ophthalmology;  Laterality: Right;   CATARACT EXTRACTION W/PHACO Left 12/20/2019   Procedure: CATARACT EXTRACTION PHACO AND INTRAOCULAR LENS PLACEMENT ([facility]) LEFT VIVITY TORIC LENS 7.51  00:43.7;  Surgeon: Birder Robson, MD;  Location: [place];  Service: Ophthalmology;  Laterality: Left;   CHOLECYSTECTOMY     COLONOSCOPY WITH PROPOFOL N/A 12/23/2016   Procedure: COLONOSCOPY WITH PROPOFOL;  Surgeon: Lollie Sails, MD;  Location[facility] ENDOSCOPY;  Service: Endoscopy;  Laterality: N/A;   GALLBLADDER SURGERY  2000   KNEE SURGERY  2003/ 2010   right knee x2 Dr. Marry Guan for torn meniscus   [place]   right, arthritis   THYROIDECTOMY  2011   right side    Family History  Problem Relation Age of Onset   Stroke Mother    Heart disease Father    Heart attack Father    Cancer Maternal Grandmother        pancreatic   Breast cancer Neg Hx     SOCIAL HX: Non-smoker   Current Outpatient Medications:    amitriptyline (ELAVIL) 50 MG tablet, TAKE 1 TABLET BY MOUTH EVERYDAY AT BEDTIME, Disp: 90 tablet,  Rfl: 1   amLODipine (NORVASC) 10 MG tablet, TAKE 1 TABLET BY MOUTH EVERY DAY, Disp: 90 tablet, Rfl: 1   Calcium Carb-Cholecalciferol (CALCIUM 1000 + D PO), Take by mouth., Disp: , Rfl:    cholecalciferol (VITAMIN D) 1000 UNITS tablet, Take 1,000 Units by mouth daily., Disp: , Rfl:    fluticasone (FLONASE) 50 MCG/ACT nasal spray, Place 2 sprays into both nostrils daily., Disp: 16 g, Rfl: 6   levothyroxine (SYNTHROID) 75 MCG tablet, TAKE 1 TABLET  (75 MCG TOTAL) BY MOUTH DAILY BEFORE BREAKFAST., Disp: 90 tablet, Rfl: 3   Multiple Vitamins-Minerals (CENTRUM SILVER PO), Take by mouth., Disp: , Rfl:    omeprazole (PRILOSEC) 20 MG capsule, TAKE 1 CAPSULE BY MOUTH EVERY DAY, Disp: 90 capsule, Rfl: 2  EXAM:  This was a telephone visit and thus no exam was completed.  ASSESSMENT AND PLAN:  Discussed the following assessment and plan:  Problem List Items Addressed This Visit     Loose stools    The patient has had 2 occurrences of loose stools in the last 2 weeks.  I discussed this could be related to a viral illness or food issue.  Subsequently ended up being constipated though that seems to have improved today.  Discussed she could use Benefiber daily and use a stool softener occasionally to help with constipation.  I advised at this point we will just monitor and she should contact us if she has recurrent loose stools or if the constipation persists.        Return if symptoms worsen or fail to improve.   I discussed the assessment and treatment plan with the patient. The patient was provided an opportunity to ask questions and all were answered. The patient agreed with the plan and demonstrated an understanding of the instructions.   The patient was advised to call back or seek an in-person evaluation if the symptoms worsen or if the condition fails to improve as anticipated.  I provided 8 minutes of non-face-to-face time during this encounter.   [name], MD

## 2021-01-29 ENCOUNTER — Encounter: Payer: Self-pay | Admitting: Family Medicine

## 2021-01-29 DIAGNOSIS — K9049 Malabsorption due to intolerance, not elsewhere classified: Secondary | ICD-10-CM

## 2021-01-31 ENCOUNTER — Other Ambulatory Visit: Payer: Self-pay | Admitting: Family Medicine

## 2021-02-07 ENCOUNTER — Encounter: Payer: Self-pay | Admitting: Family Medicine

## 2021-02-21 ENCOUNTER — Other Ambulatory Visit: Payer: Self-pay | Admitting: Family Medicine

## 2021-03-07 ENCOUNTER — Telehealth: Payer: Self-pay

## 2021-03-07 ENCOUNTER — Ambulatory Visit (INDEPENDENT_AMBULATORY_CARE_PROVIDER_SITE_OTHER): Payer: Medicare HMO

## 2021-03-07 VITALS — Ht 63.0 in | Wt 125.0 lb

## 2021-03-07 DIAGNOSIS — Z0000 Encounter for general adult medical examination without abnormal findings: Secondary | ICD-10-CM

## 2021-03-07 DIAGNOSIS — Z1231 Encounter for screening mammogram for malignant neoplasm of breast: Secondary | ICD-10-CM | POA: Diagnosis not present

## 2021-03-07 NOTE — Patient Instructions (Addendum)
  Ms. Erin [name] , Thank you for taking time to come for your Medicare Wellness Visit. I appreciate your ongoing commitment to your health goals. Please review the following plan we discussed and let me know if I can assist you in the future.   These are the goals we discussed:  Goals       Patient Stated     Follow up with Primary Care Provider (pt-stated)      Follow up as needed        This is a list of the screening recommended for you and due dates:  Health Maintenance  Topic Date Due   COVID-19 Vaccine (3 - Pfizer risk series) 03/23/2021*   Flu Shot  04/23/2021*   Zoster (Shingles) Vaccine (1 of 2) 06/07/2021*   Hepatitis C Screening: USPSTF Recommendation to screen - Ages 18-79 yo.  03/07/2022*   Pneumonia vaccines (1 of 2 - PCV13) 03/07/2022*   Mammogram  07/09/2021   Colon Cancer Screening  12/23/2021   Tetanus Vaccine  01/15/2030   DEXA scan (bone density measurement)  Completed   HPV Vaccine  Aged Out  *Topic was postponed. The date shown is not the original due date.

## 2021-03-07 NOTE — Progress Notes (Signed)
Subjective:   Erin [name] is a 70 y.o. female who presents for Medicare Annual (Subsequent) preventive examination.  Review of Systems    No ROS.  Medicare Wellness Virtual Visit.  Visual/audio telehealth visit, UTA vital signs.   See social history for additional risk factors.   Cardiac Risk Factors include: advanced age (>23mn, >>85women);hypertension     Objective:    Today's Vitals   03/07/21 0913  Weight: 125 lb (56.7 kg)  Height: '5\' 3"'$  (1.6 m)   Body mass index is 22.14 kg/m.  Advanced Directives 03/07/2021 03/06/2020 12/20/2019 11/29/2019 03/04/2019 12/23/2016  Does Patient Have a Medical Advance Directive? Yes Yes Yes Yes Yes Yes  Type of AParamedicof ACochranLiving will HThree RiversLiving will H[place]Living will H[place]orgetownLiving will HBeaver CityLiving will -  Does patient want to make changes to medical advance directive? No - Patient declined No - Patient declined - No - Patient declined No - Patient declined -  Copy of HWestphaliain Chart? No - copy requested No - copy requested No - copy requested No - copy requested No - copy requested -    Current Medications (verified) Outpatient Encounter Medications as of 03/07/2021  Medication Sig   amitriptyline (ELAVIL) 50 MG tablet TAKE 1 TABLET BY MOUTH EVERYDAY AT BEDTIME   amLODipine (NORVASC) 10 MG tablet TAKE 1 TABLET BY MOUTH EVERY DAY   cholecalciferol (VITAMIN D) 1000 UNITS tablet Take 1,000 Units by mouth daily.   fluticasone (FLONASE) 50 MCG/ACT nasal spray Place 2 sprays into both nostrils daily.   levothyroxine (SYNTHROID) 75 MCG tablet TAKE 1 TABLET (75 MCG TOTAL) BY MOUTH DAILY BEFORE BREAKFAST.   Multiple Vitamins-Minerals (CENTRUM SILVER PO) Take by mouth.   omeprazole (PRILOSEC) 20 MG capsule TAKE 1 CAPSULE BY MOUTH EVERY DAY   [DISCONTINUED] Calcium Carb-Cholecalciferol (CALCIUM 1000 + D PO)  Take by mouth.   No facility-administered encounter medications on file as of 03/07/2021.    Allergies (verified) Amoxicillin, Atorvastatin, and Codeine   History: Past Medical History:  Diagnosis Date   Cancer ([place] 2011   thyroid, Dr. CCarlis Abbott  GERD (gastroesophageal reflux disease)    Hyperlipidemia    Hx   Hypertension    PONV (postoperative nausea and vomiting)    Thyroid disease    Past Surgical History:  Procedure Laterality Date   CATARACT EXTRACTION W/PHACO Right 11/29/2019   Procedure: CATARACT EXTRACTION PHACO AND INTRAOCULAR LENS PLACEMENT ([place] RIGHT VIVITY TORIC LENS 8.80 00:45.2 ;  Surgeon: PBirder Robson MD;  [place]  Service: Ophthalmology;  Laterality: Right;   CATARACT EXTRACTION W/PHACO Left 12/20/2019   Procedure: CATARACT EXTRACTION PHACO AND INTRAOCULAR LENS PLACEMENT (IOC) LEFT VIVITY TORIC LENS 7.51  00:43.7;  Surgeon: PBirder Robson MD;  [place]  Service: Ophthalmology;  Laterality: Left;   CHOLECYSTECTOMY     COLONOSCOPY WITH PROPOFOL N/A 12/23/2016   Procedure: COLONOSCOPY WITH PROPOFOL;  Surgeon: SLollie Sails MD;  [facility];  Service: Endoscopy;  Laterality: N/A;   GALLBLADDER SURGERY  2000   KNEE SURGERY  2003/ 2010   right knee x2 Dr. HMarry Guanfor torn meniscus   [place]  right, arthritis   THYROIDECTOMY  2011   right side   Family History  Problem Relation Age of Onset   Stroke Mother    Heart disease Father    Heart attack Father  Cancer Maternal Grandmother        pancreatic   Breast cancer Neg Hx    Social History   Socioeconomic History   Marital status: Single    Spouse name: Not on file   Number of children: Not on file   Years of education: Not on file   Highest education level: Not on file  Occupational History   Not on file  Tobacco Use   Smoking status: Never   Smokeless tobacco: Never  Vaping Use   Vaping Use: Never used   Substance and Sexual Activity   Alcohol use: Yes    Alcohol/week: 0.0 standard drinks    Comment: socially (1x/week)   Drug use: No   Sexual activity: Never    Partners: Female  Other Topics Concern   Not on file  Social History Narrative   Lives in [place].      Work - [place] Strain: Low Risk    Difficulty of Paying Living Expenses: Not hard at all  Food Insecurity: No Food Insecurity   Worried About Charity fundraiser in the Last Year: Never true   Arboriculturist in the Last Year: Never true  Transportation Needs: No Transportation Needs   Lack of Transportation (Medical): No   Lack of Transportation (Non-Medical): No  Physical Activity: Not on file  Stress: No Stress Concern Present   Feeling of Stress : Not at all  Social Connections: Unknown   Frequency of Communication with Friends and Family: More than three times a week   Frequency of Social Gatherings with Friends and Family: More than three times a week   Attends Religious Services: Not on Electrical engineer or Organizations: Not on file   Attends Archivist Meetings: Not on file   Marital Status: Not on file    Tobacco Counseling Counseling given: Not Answered   Clinical Intake:  Pre-visit preparation completed: Yes        Diabetes: No  How often do you need to have someone help you when you read instructions, pamphlets, or other written materials from your doctor or pharmacy?: 1 - Never    Interpreter Needed?: No      Activities of Daily Living In your present state of health, do you have any difficulty performing the following activities: 03/07/2021  Hearing? N  Vision? N  Difficulty concentrating or making decisions? N  Walking or climbing stairs? N  Dressing or bathing? N  Doing errands, shopping? N  Preparing Food and eating ? N  Using the Toilet? N  In the past six months, have you accidently leaked urine?  N  Do you have problems with loss of bowel control? N  Managing your Medications? N  Managing your Finances? N  Housekeeping or managing your Housekeeping? N  Some recent data might be hidden    Patient Care Team: Leone Haven, MD as PCP - General (Family Medicine)  Indicate any recent Medical Services you may have received from other than [facility] providers in the past year (date may be approximate).     Assessment:   This is a routine wellness examination for [place].  I connected with Erin [name] today by telephone and verified that I am speaking with the correct person using two identifiers. Location patient: home Location provider: work Persons participating in the virtual visit: patient, Marine scientist.    I discussed the limitations, risks, security  and privacy concerns of performing an evaluation and management service by telephone and the availability of in person appointments. The patient expressed understanding and verbally consented to this telephonic visit.    Interactive audio and video telecommunications were attempted between this provider and patient, however failed, due to patient having technical difficulties OR patient did not have access to video capability.  We continued and completed visit with audio only.  Some vital signs may be absent or patient reported.   Hearing/Vision screen Hearing Screening - Comments:: Patient is able to hear conversational tones without difficulty.  No issues reported. Vision Screening - Comments:: Cataract extraction, bilateral  They have seen their ophthalmologist.   Dietary issues and exercise activities discussed: Current Exercise Habits: Home exercise routine, Type of exercise: strength training/weights;calisthenics;walking, Time (Minutes): 20, Frequency (Times/Week): 7, Weekly Exercise (Minutes/Week): 140, Intensity: Mild [place] diet Good water intake   Goals Addressed               This Visit's Progress     Patient Stated      Follow up with Primary Care Provider (pt-stated)        Follow up as needed       Depression Screen PHQ 2/9 Scores 03/07/2021 11/29/2020 07/17/2020 03/06/2020 01/16/2020 10/10/2019 07/06/2019  PHQ - 2 Score 0 0 0 0 0 0 0    Fall Risk Fall Risk  03/07/2021 11/29/2020 07/17/2020 03/06/2020 01/16/2020  Falls in the past year? 0 0 0 0 0  Number falls in past yr: 0 0 0 0 0  Injury with Fall? - - - - -  Follow up Falls evaluation completed Falls evaluation completed Falls evaluation completed Falls evaluation completed Falls evaluation completed    [place]: Adequate lighting in your home to reduce risk of falls? Yes   ASSISTIVE DEVICES UTILIZED TO PREVENT FALLS: Life alert? No  Use of a cane, walker or w/c? No   TIMED UP AND GO: Was the test performed? No .   Cognitive Function: Patient is alert and oriented x3.  MMSE/6CIT deferred. Normal by direct communication/observation.  MMSE - Mini Mental State Exam 03/06/2020  Not completed: Unable to complete     6CIT Screen 03/04/2019  What Year? 0 points  What month? 0 points  What time? 0 points  Count back from 20 0 points  Months in reverse 0 points  Repeat phrase 0 points  Total Score 0    Immunizations Immunization History  Administered Date(s) Administered   Fluad Quad(high Dose 65+) 05/12/2019, 06/25/2020   Influenza, High Dose Seasonal PF 05/28/2017, 06/07/2018   Influenza-Unspecified 04/20/2013, 05/28/2015   PFIZER(Purple Top)SARS-COV-2 Vaccination 03/05/2020, 03/26/2020   Td 01/16/2020   Tdap 12/04/2005   Mammogram- ordered per consent Shingrix vaccine- deferred per patient preference.  PNA vaccine- deferred per patient preference.  Hep C Screening- deferred per patient preference.  Covid vaccine- 2 completed.   Health Maintenance Health Maintenance  Topic Date Due   COVID-19 Vaccine (3 - Pfizer risk series) 03/23/2021 (Originally 04/23/2020)   INFLUENZA VACCINE  04/23/2021 (Originally  03/04/2021)   Zoster Vaccines- Shingrix (1 of 2) 06/07/2021 (Originally 04/07/1970)   Hepatitis C Screening  03/07/2022 (Originally 04/06/1969)   PNA vac Low Risk Adult (1 of 2 - PCV13) 03/07/2022 (Originally 04/06/2017)   MAMMOGRAM  07/09/2021   COLONOSCOPY (Pts 45-48yr Insurance coverage will need to be confirmed)  12/23/2021   TETANUS/TDAP  01/15/2030   DEXA SCAN  Completed   HPV VACCINES  Aged Out   Lung Cancer Screening: (Low Dose CT Chest recommended if Age 80-80 years, 30 pack-year currently smoking OR have quit w/in 15years.) does not qualify.   Vision Screening: Recommended annual ophthalmology exams for early detection of glaucoma and other disorders of the eye.  Dental Screening: Recommended annual dental exams for proper oral hygiene.  Community Resource Referral / Chronic Care Management: CRR required this visit?  No   CCM required this visit?  No      Plan:   Keep all routine maintenance appointments.   I have personally reviewed and noted the following in the patient's chart:   Medical and social history Use of alcohol, tobacco or illicit drugs  Current medications and supplements including opioid prescriptions. Patient is not currently taking opioid.  Functional ability and status Nutritional status Physical activity Advanced directives List of other physicians Hospitalizations, surgeries, and ER visits in previous 12 months Vitals Screenings to include cognitive, depression, and falls Referrals and appointments  In addition, I have reviewed and discussed with patient certain preventive protocols, quality metrics, and best practice recommendations. A written personalized care plan for preventive services as well as general preventive health recommendations were provided to patient via mychart.     Varney Biles, LPN   [id number]

## 2021-03-07 NOTE — Telephone Encounter (Signed)
No answer when called for shchedule AWV. Unable to leave a message. Reschedule as appropriate.

## 2021-03-18 ENCOUNTER — Ambulatory Visit: Payer: Medicare HMO | Admitting: Dietician

## 2021-04-24 ENCOUNTER — Other Ambulatory Visit: Payer: Self-pay | Admitting: Family Medicine

## 2021-06-03 ENCOUNTER — Ambulatory Visit (INDEPENDENT_AMBULATORY_CARE_PROVIDER_SITE_OTHER): Payer: Medicare HMO | Admitting: Family Medicine

## 2021-06-03 ENCOUNTER — Encounter: Payer: Self-pay | Admitting: Family Medicine

## 2021-06-03 ENCOUNTER — Other Ambulatory Visit: Payer: Self-pay

## 2021-06-03 VITALS — BP 110/60 | HR 85 | Temp 98.2°F | Ht 63.0 in | Wt 126.8 lb

## 2021-06-03 DIAGNOSIS — E039 Hypothyroidism, unspecified: Secondary | ICD-10-CM

## 2021-06-03 DIAGNOSIS — K219 Gastro-esophageal reflux disease without esophagitis: Secondary | ICD-10-CM | POA: Diagnosis not present

## 2021-06-03 DIAGNOSIS — J40 Bronchitis, not specified as acute or chronic: Secondary | ICD-10-CM | POA: Insufficient documentation

## 2021-06-03 DIAGNOSIS — E782 Mixed hyperlipidemia: Secondary | ICD-10-CM | POA: Diagnosis not present

## 2021-06-03 DIAGNOSIS — Z23 Encounter for immunization: Secondary | ICD-10-CM | POA: Diagnosis not present

## 2021-06-03 DIAGNOSIS — I10 Essential (primary) hypertension: Secondary | ICD-10-CM

## 2021-06-03 DIAGNOSIS — R195 Other fecal abnormalities: Secondary | ICD-10-CM

## 2021-06-03 LAB — LIPID PANEL
Cholesterol: 202 mg/dL — ABNORMAL HIGH (ref 0–200)
HDL: 38.9 mg/dL — ABNORMAL LOW (ref >39.00)
LDL Cholesterol: 128 mg/dL — ABNORMAL HIGH (ref 0–99)
NonHDL: 163.52
Total CHOL/HDL Ratio: 5
Triglycerides: 180 mg/dL — ABNORMAL HIGH (ref 0.0–149.0)
VLDL: 36 mg/dL (ref 0.0–40.0)

## 2021-06-03 LAB — COMPREHENSIVE METABOLIC PANEL
ALT: 21 U/L (ref 0–35)
AST: 24 U/L (ref 0–37)
Albumin: 4.4 g/dL (ref 3.5–5.2)
Alkaline Phosphatase: 69 U/L (ref 39–117)
BUN: 14 mg/dL (ref 6–23)
CO2: 28 mEq/L (ref 19–32)
Calcium: 9.6 mg/dL (ref 8.4–10.5)
Chloride: 104 mEq/L (ref 96–112)
Creatinine, Ser: 0.75 mg/dL (ref 0.40–1.20)
GFR: 81.38 mL/min (ref >60.00)
Glucose, Bld: 94 mg/dL (ref 70–99)
Potassium: 4.2 mEq/L (ref 3.5–5.1)
Sodium: 142 mEq/L (ref 135–145)
Total Bilirubin: 0.3 mg/dL (ref 0.2–1.2)
Total Protein: 7.3 g/dL (ref 6.0–8.3)

## 2021-06-03 LAB — TSH: TSH: 2.06 u[IU]/mL (ref 0.35–5.50)

## 2021-06-03 MED ORDER — AZITHROMYCIN 250 MG PO TABS: ORAL_TABLET | ORAL | 0 refills | Status: AC

## 2021-06-03 NOTE — Assessment & Plan Note (Signed)
Check lipid panel today.  Continue with diet changes.

## 2021-06-03 NOTE — Assessment & Plan Note (Addendum)
Suspect bronchitis.  Discussed that this could represent a viral illness or bacterial illness.  She was negative COVID 1 week ago.  She is outside of the quarantine and treatment window for COVID thus there is no reason to test her for COVID again.  Given that she has been worsening with her cough over the last several days we will go ahead and treat with azithromycin.  She will continue over-the-counter Robitussin-DM.  If she develops shortness of breath or cough productive of blood she will seek medical attention immediately.  If her cough worsens she will let us know.

## 2021-06-03 NOTE — Progress Notes (Signed)
[name], MD Phone: [phone]  Erin [name] is a 70 y.o. female who presents today for f/u.  HYPERTENSION Disease Monitoring Home BP Monitoring not checking Chest pain- no    Dyspnea- no Medications Compliance-  taking amlodipine.   Edema- no BMET    Component Value Date/Time   NA 142 01/16/2020 0853   NA 141 04/20/2012 0708   K 4.3 01/16/2020 0853   K 4.0 04/20/2012 0708   CL 104 01/16/2020 0853   CL 106 04/20/2012 0708   CO2 30 01/16/2020 0853   CO2 30 04/20/2012 0708   GLUCOSE 100 (H) 01/16/2020 0853   GLUCOSE 104 (H) 04/20/2012 0708   BUN 16 01/16/2020 0853   BUN 17 04/20/2012 0708   CREATININE 0.73 01/16/2020 0853   CREATININE 0.76 04/20/2012 0708   CALCIUM 9.5 01/16/2020 0853   CALCIUM 9.4 04/20/2012 0708   GFRNONAA >60 07/06/2019 0930   GFRNONAA >60 04/20/2012 0708   GFRAA >60 07/06/2019 0930   GFRAA >60 04/20/2012 0708   HYPOTHYROIDISM Disease Monitoring Weight changes: no  Skin Changes: no Heat/Cold intolerance: no  Medication Monitoring Compliance:  taking synthroid   Last TSH:   Lab Results  Component Value Date   TSH 0.99 11/14/2020   GERD:   Reflux symptoms: rare depending on what she eats   Abd pain: no   Blood in stool: no  Dysphagia: no   EGD: 2007  Medication: omeprazole  Respiratory infection: Patient notes this started on 1022/22.  Started with sore throat and then she developed cough.  She notes the cough is gotten worse the last couple of days.  She is not getting much out.  There is no sinus congestion.  No fevers.  No postnasal drip.  No shortness of breath.  She tested negative for COVID on 05/27/2021.  She notes no COVID exposures.  She has been vaccinated x2.  She has been using Robitussin-DM for symptom management.  Hyperlipidemia: Patient has been watching what she eats.  She is eating more protein.   Social History   Tobacco Use  Smoking Status Never  Smokeless Tobacco Never    Current Outpatient Medications  on File Prior to Visit  Medication Sig Dispense Refill   amitriptyline (ELAVIL) 50 MG tablet TAKE 1 TABLET BY MOUTH EVERYDAY AT BEDTIME 90 tablet 1   amLODipine (NORVASC) 10 MG tablet TAKE 1 TABLET BY MOUTH EVERY DAY 90 tablet 1   cholecalciferol (VITAMIN D) 1000 UNITS tablet Take 1,000 Units by mouth daily.     levothyroxine (SYNTHROID) 75 MCG tablet TAKE 1 TABLET (75 MCG TOTAL) BY MOUTH DAILY BEFORE BREAKFAST. 90 tablet 3   Multiple Vitamins-Minerals (CENTRUM SILVER PO) Take by mouth.     omeprazole (PRILOSEC) 20 MG capsule TAKE 1 CAPSULE BY MOUTH EVERY DAY 90 capsule 2   No current facility-administered medications on file prior to visit.     ROS see history of present illness  Objective  Physical Exam Vitals:   06/03/21 0811  BP: 110/60  Pulse: 85  Temp: 98.2 F (36.8 C)  SpO2: 98%    BP Readings from Last 3 Encounters:  06/03/21 110/60  11/29/20 118/78  01/16/20 110/60   Wt Readings from Last 3 Encounters:  06/03/21 126 lb 12.8 oz (57.5 kg)  03/07/21 125 lb (56.7 kg)  11/29/20 125 lb 6.4 oz (56.9 kg)    Physical Exam Constitutional:      General: She is not in acute distress.    Appearance: She is  not diaphoretic.  Cardiovascular:     Rate and Rhythm: Normal rate and regular rhythm.     Heart sounds: Normal heart sounds.  Pulmonary:     Effort: Pulmonary effort is normal.     Breath sounds: Normal breath sounds.  Musculoskeletal:     Right lower leg: No edema.     Left lower leg: No edema.  Skin:    General: Skin is warm and dry.  Neurological:     Mental Status: She is alert.     Assessment/Plan: Please see individual problem list.  Problem List Items Addressed This Visit     Bronchitis    Suspect bronchitis.  Discussed that this could represent a viral illness or bacterial illness.  She was negative COVID 1 week ago.  She is outside of the quarantine and treatment window for COVID thus there is no reason to test her for COVID again.  Given that  she has been worsening with her cough over the last several days we will go ahead and treat with azithromycin.  She will continue over-the-counter Robitussin-DM.  If she develops shortness of breath or cough productive of blood she will seek medical attention immediately.  If her cough worsens she will let us know.      Relevant Medications   azithromycin (ZITHROMAX) 250 MG tablet   GERD (gastroesophageal reflux disease)    Generally well controlled.  She will continue omeprazole 20 mg once daily.  She will avoid food triggers.      Hyperlipidemia    Check lipid panel today.  Continue with diet changes.      Relevant Orders   Comp Met (CMET)   Lipid panel   Hypertension - Primary    Well-controlled.  Continue amlodipine 10 mg once daily.      Relevant Orders   Comp Met (CMET)   Hypothyroidism    Check TSH.  Continue Synthroid 75 mcg once daily.      Relevant Orders   TSH   Loose stools    Reports these have significantly improved with dietary changes.  She will continue to monitor.        Health Maintenance: Encouraged her to get her flu shot in 1 to 2 weeks after she has recovered from her current illness.  Return in about 6 months (around 12/01/2021) for Hypertension/hypothyroidism.  This visit occurred during the SARS-CoV-2 public health emergency.  Safety protocols were in place, including screening questions prior to the visit, additional usage of staff PPE, and extensive cleaning of exam room while observing appropriate contact time as indicated for disinfecting solutions.    [name], MD [place]

## 2021-06-03 NOTE — Patient Instructions (Signed)
Nice to see you. You likely have bronchitis.  We will treat you with azithromycin to cover for a possible bacterial cause.  If your symptoms do not improve please let us know. If you develop shortness of breath or cough productive of blood please seek medical attention immediately. We will get labs today and contact you with results.

## 2021-06-03 NOTE — Assessment & Plan Note (Signed)
Well-controlled.  Continue amlodipine 10 mg once daily. 

## 2021-06-03 NOTE — Assessment & Plan Note (Signed)
Reports these have significantly improved with dietary changes.  She will continue to monitor.

## 2021-06-03 NOTE — Assessment & Plan Note (Signed)
Generally well controlled.  She will continue omeprazole 20 mg once daily.  She will avoid food triggers.

## 2021-06-03 NOTE — Assessment & Plan Note (Signed)
Check TSH.  Continue Synthroid 75 mcg once daily. 

## 2021-06-10 ENCOUNTER — Other Ambulatory Visit: Payer: Self-pay | Admitting: Family Medicine

## 2021-06-10 DIAGNOSIS — Z1231 Encounter for screening mammogram for malignant neoplasm of breast: Secondary | ICD-10-CM

## 2021-06-18 ENCOUNTER — Ambulatory Visit (INDEPENDENT_AMBULATORY_CARE_PROVIDER_SITE_OTHER): Payer: Medicare HMO

## 2021-06-18 ENCOUNTER — Other Ambulatory Visit: Payer: Self-pay

## 2021-06-18 DIAGNOSIS — Z23 Encounter for immunization: Secondary | ICD-10-CM | POA: Diagnosis not present

## 2021-07-04 ENCOUNTER — Encounter: Payer: Self-pay | Admitting: Family Medicine

## 2021-07-05 NOTE — Telephone Encounter (Signed)
Pt called office requesting to speak with you in regards to previous messages. Pls call when you get a moment. Also we don't have any appt available until 12/6 at 8:30 with [place]

## 2021-07-05 NOTE — Telephone Encounter (Signed)
I called and scheduled the patient for urgent care tomorrow for her UTI symptoms.  Kenlee Maler,cma

## 2021-07-06 ENCOUNTER — Ambulatory Visit
Admission: RE | Admit: 2021-07-06 | Discharge: 2021-07-06 | Disposition: A | Discharge status: Home / Self Care | Payer: Medicare HMO | Source: Ambulatory Visit | Attending: Emergency Medicine | Admitting: Emergency Medicine

## 2021-07-06 ENCOUNTER — Other Ambulatory Visit: Payer: Self-pay

## 2021-07-06 VITALS — BP 130/80 | HR 64 | Temp 98.1°F | Resp 16

## 2021-07-06 DIAGNOSIS — R300 Dysuria: Secondary | ICD-10-CM

## 2021-07-06 DIAGNOSIS — R350 Frequency of micturition: Secondary | ICD-10-CM

## 2021-07-06 LAB — POCT URINALYSIS DIP (MANUAL ENTRY)
Bilirubin, UA: NEGATIVE
Glucose, UA: NEGATIVE mg/dL
Ketones, POC UA: NEGATIVE mg/dL
Leukocytes, UA: NEGATIVE
Nitrite, UA: NEGATIVE
Protein Ur, POC: NEGATIVE mg/dL
Spec Grav, UA: <=1.005 — AB (ref 1.010–1.025)
Urobilinogen, UA: 0.2 E.U./dL
pH, UA: 5.5 (ref 5.0–8.0)

## 2021-07-06 NOTE — ED Provider Notes (Signed)
Erin [name]    CSN: [id number] Arrival date & time: 07/06/21  8850      History   Chief Complaint Chief Complaint  Patient presents with   Dysuria    HPI Erin [name] is a 70 y.o. female.  Patient presents with 3-day history of dysuria, urgency, and frequency.  She denies fever, chills, abdominal pain, flank pain, vaginal discharge, pelvic pain, or other symptoms.  Treatment attempted at home with increased water intake.  Her medical history includes hypertension, thyroid cancer, hyperlipidemia, GERD.  The history is provided by the patient and medical records.   Past Medical History:  Diagnosis Date   Cancer [facility]) 2011   thyroid, Dr. Carlis Abbott   GERD (gastroesophageal reflux disease)    Hyperlipidemia    Hx   Hypertension    PONV (postoperative nausea and vomiting)    Thyroid disease     Patient Active Problem List   Diagnosis Date Noted   Bronchitis 06/03/2021   Loose stools 01/28/2021   Basal cell carcinoma 10/10/2019   Hypertension 07/06/2019   Palpitations 07/06/2019   Thyroid nodule 01/05/2019   Chest pain with moderate risk for cardiac etiology 11/03/2017   Colon polyps 05/28/2017   Neck pain on right side 07/09/2016   Adjustment disorder with mixed anxiety and depressed mood 11/26/2015   Right hip pain 10/24/2013   Arthralgia 08/11/2013   Osteopenia 06/08/2013   GERD (gastroesophageal reflux disease) 04/19/2012   Hyperlipidemia 04/19/2012   Hypothyroidism 04/19/2012    Past Surgical History:  Procedure Laterality Date   CATARACT EXTRACTION W/PHACO Right 11/29/2019   Procedure: CATARACT EXTRACTION PHACO AND INTRAOCULAR LENS PLACEMENT ([place]) RIGHT VIVITY TORIC LENS 8.80 00:45.2 ;  Surgeon: Erin Robson, Erin [name];  Location: [place];  Service: Ophthalmology;  Laterality: Right;   CATARACT EXTRACTION W/PHACO Left 12/20/2019   Procedure: CATARACT EXTRACTION PHACO AND INTRAOCULAR LENS PLACEMENT ([facility]) LEFT VIVITY TORIC LENS 7.51  00:43.7;   Surgeon: Erin Robson, Erin [name];  Location: [place];  Service: Ophthalmology;  Laterality: Left;   CHOLECYSTECTOMY     COLONOSCOPY WITH PROPOFOL N/A 12/23/2016   Procedure: COLONOSCOPY WITH PROPOFOL;  Surgeon: Erin Sails, Erin [name];  [name];  Service: Endoscopy;  Laterality: N/A;   GALLBLADDER SURGERY  2000   KNEE SURGERY  2003/ 2010   right knee x2 Dr. Marry [name] for torn meniscus   [name]   right, arthritis   THYROIDECTOMY  2011   right side    OB History   No obstetric history on file.      Home Medications    Prior to Admission medications   Medication Sig Start Date End Date Taking? Authorizing Provider  amitriptyline (ELAVIL) 50 MG tablet TAKE 1 TABLET BY MOUTH EVERYDAY AT BEDTIME 02/21/21   Erin Haven, Erin [name]  amLODipine (NORVASC) 10 MG tablet TAKE 1 TABLET BY MOUTH EVERY DAY 04/24/21   Erin Haven, Erin [name]  cholecalciferol (VITAMIN D) 1000 UNITS tablet Take 1,000 Units by mouth daily.    Provider, Historical, Erin [name]  levothyroxine (SYNTHROID) 75 MCG tablet TAKE 1 TABLET (75 MCG TOTAL) BY MOUTH DAILY BEFORE BREAKFAST. 01/05/19   Erin Haven, Erin [name]  Multiple Vitamins-Minerals (CENTRUM SILVER PO) Take by mouth.    Provider, Historical, [name]  omeprazole (PRILOSEC) 20 MG capsule TAKE 1 CAPSULE BY MOUTH EVERY DAY 01/31/21   Erin Haven, Erin [name]    Family History Family History  Problem Relation Age of Onset   Stroke Mother  Heart disease Father    Heart attack Father    Cancer Maternal Grandmother        pancreatic   Breast cancer Neg Hx     Social History Social History   Tobacco Use   Smoking status: Never   Smokeless tobacco: Never  Vaping Use   Vaping Use: Never used  Substance Use Topics   Alcohol use: Yes    Alcohol/week: 0.0 standard drinks    Comment: socially (1x/week)   Drug use: No     Allergies   Amoxicillin, Atorvastatin, and Codeine   Review of Systems Review of Systems  Constitutional:   Negative for chills and fever.  Respiratory:  Negative for cough and shortness of breath.   Cardiovascular:  Negative for chest pain and palpitations.  Gastrointestinal:  Negative for abdominal pain and vomiting.  Genitourinary:  Positive for dysuria, frequency and urgency. Negative for flank pain, hematuria, pelvic pain and vaginal discharge.  Skin:  Negative for color change and rash.  All other systems reviewed and are negative.   Physical Exam Triage Vital Signs ED Triage Vitals  Enc Vitals Group     BP      Pulse      Resp      Temp      Temp src      SpO2      Weight      Height      Head Circumference      Peak Flow      Pain Score      Pain Loc      Pain Edu?      Excl. in [place]?    No data found.  Updated Vital Signs BP 130/80   Pulse 64   Temp 98.1 F (36.7 C)   Resp 16   SpO2 98%   Visual Acuity Right Eye Distance:   Left Eye Distance:   Bilateral Distance:    Right Eye Near:   Left Eye Near:    Bilateral Near:     Physical Exam Vitals and nursing note reviewed.  Constitutional:      General: She is not in acute distress.    Appearance: She is well-developed.  HENT:     Mouth/Throat:     Mouth: Mucous membranes are moist.  Cardiovascular:     Rate and Rhythm: Normal rate and regular rhythm.     Heart sounds: Normal heart sounds.  Pulmonary:     Effort: Pulmonary effort is normal. No respiratory distress.     Breath sounds: Normal breath sounds.  Abdominal:     Palpations: Abdomen is soft.     Tenderness: There is no abdominal tenderness. There is no right CVA tenderness, left CVA tenderness, guarding or rebound.  Musculoskeletal:     Cervical back: Neck supple.  Skin:    General: Skin is warm and dry.  Neurological:     Mental Status: She is alert.  Psychiatric:        Mood and Affect: Mood normal.        Behavior: Behavior normal.     UC Treatments / Results  Labs (all labs ordered are listed, but only abnormal results are  displayed) Labs Reviewed  POCT URINALYSIS DIP (MANUAL ENTRY) - Abnormal; Notable for the following components:      Result Value   Spec Grav, UA <=1.005 (*)    Blood, UA trace-lysed (*)    All other components within normal limits  EKG   Radiology No results found.  Procedures Procedures (including critical care time)  Medications Ordered in UC Medications - No data to display  Initial Impression / Assessment and Plan / UC Course  I have reviewed the triage vital signs and the nursing notes.  Pertinent labs & imaging results that were available during my care of the patient were reviewed by me and considered in my medical decision making (see chart for details).   Dysuria, urinary frequency.  Urine does not indicate infection.  Education provided on dysuria and urinary frequency.  Instructed patient to follow-up with her gynecologist or PCP if her symptoms are not improving.  She agrees to plan of care.   Final Clinical Impressions(s) / UC Diagnoses   Final diagnoses:  Dysuria  Urinary frequency     Discharge Instructions      Your urine does not show signs of infection today.  Follow up with your gynecologist or primary care provider if your symptoms are not improving.        ED Prescriptions   None    PDMP not reviewed this encounter.   Sharion Balloon, NP 07/06/21 [phone]

## 2021-07-06 NOTE — Discharge Instructions (Addendum)
Your urine does not show signs of infection today.  Follow up with your gynecologist or primary care provider if your symptoms are not improving.

## 2021-07-06 NOTE — ED Triage Notes (Signed)
Pt here with increased urinary frequency, urgency and burning x 3 days.

## 2021-07-09 ENCOUNTER — Telehealth: Payer: Medicare HMO | Admitting: Adult Health

## 2021-07-10 ENCOUNTER — Other Ambulatory Visit: Payer: Self-pay

## 2021-07-10 ENCOUNTER — Ambulatory Visit
Admission: RE | Admit: 2021-07-10 | Discharge: 2021-07-10 | Disposition: A | Discharge status: Home / Self Care | Payer: Medicare HMO | Source: Ambulatory Visit | Attending: Family Medicine | Admitting: Family Medicine

## 2021-07-10 DIAGNOSIS — Z1231 Encounter for screening mammogram for malignant neoplasm of breast: Secondary | ICD-10-CM | POA: Diagnosis not present

## 2021-08-14 ENCOUNTER — Other Ambulatory Visit: Payer: Self-pay | Admitting: Family Medicine

## 2021-08-14 DIAGNOSIS — H43813 Vitreous degeneration, bilateral: Secondary | ICD-10-CM | POA: Diagnosis not present

## 2021-08-14 DIAGNOSIS — Z0100 Encounter for examination of eyes and vision without abnormal findings: Secondary | ICD-10-CM | POA: Diagnosis not present

## 2021-08-29 DIAGNOSIS — E063 Autoimmune thyroiditis: Secondary | ICD-10-CM | POA: Diagnosis not present

## 2021-08-29 DIAGNOSIS — K229 Disease of esophagus, unspecified: Secondary | ICD-10-CM | POA: Diagnosis not present

## 2021-08-29 DIAGNOSIS — E785 Hyperlipidemia, unspecified: Secondary | ICD-10-CM | POA: Diagnosis not present

## 2021-08-29 DIAGNOSIS — M1991 Primary osteoarthritis, unspecified site: Secondary | ICD-10-CM | POA: Diagnosis not present

## 2021-08-29 DIAGNOSIS — E890 Postprocedural hypothyroidism: Secondary | ICD-10-CM | POA: Diagnosis not present

## 2021-08-29 DIAGNOSIS — C73 Malignant neoplasm of thyroid gland: Secondary | ICD-10-CM | POA: Diagnosis not present

## 2021-08-29 LAB — CBC: RBC: 5.24 — AB (ref 3.87–5.11)

## 2021-08-29 LAB — BASIC METABOLIC PANEL
BUN: 12 (ref 4–21)
Chloride: 104 (ref 99–108)
Creatinine: 0.8 (ref <=1.1)
Glucose: 96
Potassium: 4.8 mEq/L (ref 3.5–5.1)
Sodium: 141 (ref 137–147)

## 2021-08-29 LAB — COMPREHENSIVE METABOLIC PANEL
Albumin: 4.6 (ref 3.5–5.0)
Calcium: 9.6 (ref 8.7–10.7)

## 2021-08-29 LAB — CBC AND DIFFERENTIAL
HCT: 47 — AB (ref 36–46)
Hemoglobin: 15.6 (ref 12.0–16.0)
Platelets: 311 10*3/uL (ref 150–400)
WBC: 8.6

## 2021-08-29 LAB — HEPATIC FUNCTION PANEL: Bilirubin, Total: 0.3

## 2021-08-30 LAB — LIPID PANEL
Cholesterol: 246 — AB (ref 0–200)
HDL: 39 (ref 35–70)
Triglycerides: 195 — AB (ref 40–160)

## 2021-09-10 DIAGNOSIS — E063 Autoimmune thyroiditis: Secondary | ICD-10-CM | POA: Diagnosis not present

## 2021-09-10 DIAGNOSIS — K229 Disease of esophagus, unspecified: Secondary | ICD-10-CM | POA: Diagnosis not present

## 2021-09-10 DIAGNOSIS — C73 Malignant neoplasm of thyroid gland: Secondary | ICD-10-CM | POA: Diagnosis not present

## 2021-09-10 DIAGNOSIS — E785 Hyperlipidemia, unspecified: Secondary | ICD-10-CM | POA: Diagnosis not present

## 2021-10-10 DIAGNOSIS — L821 Other seborrheic keratosis: Secondary | ICD-10-CM | POA: Diagnosis not present

## 2021-10-10 DIAGNOSIS — D2272 Melanocytic nevi of left lower limb, including hip: Secondary | ICD-10-CM | POA: Diagnosis not present

## 2021-10-10 DIAGNOSIS — D225 Melanocytic nevi of trunk: Secondary | ICD-10-CM | POA: Diagnosis not present

## 2021-10-10 DIAGNOSIS — D2262 Melanocytic nevi of left upper limb, including shoulder: Secondary | ICD-10-CM | POA: Diagnosis not present

## 2021-10-10 DIAGNOSIS — Z85828 Personal history of other malignant neoplasm of skin: Secondary | ICD-10-CM | POA: Diagnosis not present

## 2021-10-18 ENCOUNTER — Other Ambulatory Visit: Payer: Self-pay | Admitting: Family Medicine

## 2021-10-20 ENCOUNTER — Other Ambulatory Visit: Payer: Self-pay | Admitting: Family Medicine

## 2021-11-18 ENCOUNTER — Encounter: Payer: Self-pay | Admitting: Family Medicine

## 2021-11-19 ENCOUNTER — Ambulatory Visit: Payer: Medicare HMO | Admitting: Internal Medicine

## 2021-11-19 ENCOUNTER — Ambulatory Visit (INDEPENDENT_AMBULATORY_CARE_PROVIDER_SITE_OTHER): Payer: Medicare HMO | Admitting: Family

## 2021-11-19 ENCOUNTER — Encounter: Payer: Self-pay | Admitting: Family

## 2021-11-19 ENCOUNTER — Ambulatory Visit (INDEPENDENT_AMBULATORY_CARE_PROVIDER_SITE_OTHER): Payer: Medicare HMO

## 2021-11-19 VITALS — BP 130/72 | HR 99 | Temp 98.7°F | Ht 63.0 in | Wt 130.4 lb

## 2021-11-19 DIAGNOSIS — E782 Mixed hyperlipidemia: Secondary | ICD-10-CM | POA: Diagnosis not present

## 2021-11-19 DIAGNOSIS — R0789 Other chest pain: Secondary | ICD-10-CM | POA: Diagnosis not present

## 2021-11-19 LAB — COMPREHENSIVE METABOLIC PANEL
ALT: 18 U/L (ref 0–35)
AST: 23 U/L (ref 0–37)
Albumin: 4.7 g/dL (ref 3.5–5.2)
Alkaline Phosphatase: 74 U/L (ref 39–117)
BUN: 15 mg/dL (ref 6–23)
CO2: 28 mEq/L (ref 19–32)
Calcium: 9.5 mg/dL (ref 8.4–10.5)
Chloride: 103 mEq/L (ref 96–112)
Creatinine, Ser: 0.76 mg/dL (ref 0.40–1.20)
GFR: 79.84 mL/min (ref >60.00)
Glucose, Bld: 113 mg/dL — ABNORMAL HIGH (ref 70–99)
Potassium: 4 mEq/L (ref 3.5–5.1)
Sodium: 140 mEq/L (ref 135–145)
Total Bilirubin: 0.4 mg/dL (ref 0.2–1.2)
Total Protein: 7.5 g/dL (ref 6.0–8.3)

## 2021-11-19 LAB — CBC WITH DIFFERENTIAL/PLATELET
Basophils Absolute: 0.1 10*3/uL (ref 0.0–0.1)
Basophils Relative: 0.6 % (ref 0.0–3.0)
Eosinophils Absolute: 0.1 10*3/uL (ref 0.0–0.7)
Eosinophils Relative: 1.3 % (ref 0.0–5.0)
HCT: 44.8 % (ref 36.0–46.0)
Hemoglobin: 15.3 g/dL — ABNORMAL HIGH (ref 12.0–15.0)
Lymphocytes Relative: 18.2 % (ref 12.0–46.0)
Lymphs Abs: 1.7 10*3/uL (ref 0.7–4.0)
MCHC: 34.3 g/dL (ref 30.0–36.0)
MCV: 91.3 fl (ref 78.0–100.0)
Monocytes Absolute: 0.6 10*3/uL (ref 0.1–1.0)
Monocytes Relative: 6 % (ref 3.0–12.0)
Neutro Abs: 7.1 10*3/uL (ref 1.4–7.7)
Neutrophils Relative %: 73.9 % (ref 43.0–77.0)
Platelets: 293 10*3/uL (ref 150.0–400.0)
RBC: 4.9 Mil/uL (ref 3.87–5.11)
RDW: 13.8 % (ref 11.5–15.5)
WBC: 9.6 10*3/uL (ref 4.0–10.5)

## 2021-11-19 LAB — TSH: TSH: 1.32 u[IU]/mL (ref 0.35–5.50)

## 2021-11-19 NOTE — Progress Notes (Signed)
Patient stated she has uneasy feeling up around neck and upper chest area. Not painful though. Not sure if it is thyroid out of whack or BP ?

## 2021-11-19 NOTE — Assessment & Plan Note (Addendum)
Patient is well-appearing and nontoxic in appearance.  ? we discussed that symptoms and description remains vague at this time.  Etiology is unclear. Denies SOB.  No previous history of lung disease.  Interestingly "chest tightness" described as ache is relieved with deep palpation and with exercise.  Atypical presentation for costochondritis.  Question if this is musculoskeletal in etiology and advised her to trial  using heat, topical agents including Biofreeze or Voltaren gel.  Pending chest x-ray, labs slightly. She is orthostatic based on DBP dropping 10 points. See flow sheet.  As dizziness is not a frequent or persistent occurrence, we jointly agreed that patient will continue to drink lots of water and be very conscientious when going from sitting to standing.  We may opt to decrease amlodipine in the future if symptom were to become persistent. ?EKG shows NSR.Nonspecific T wave change in lead V1.  no significant changes when compared to previous EKG Dec 17, 2017.  At that time she was seen in Dr. Rockey Situ, cardiology for restratification.  She did not opt to have a CT calcium score at that time.   ?Based on family history, nonspecific EKG changes, elevated LDL cholesterol, we agreed consult for restratification and consideration for CT calcium score is appropriate.  I placed a referral to cardiology. ?Close follow-up and emphasized the importance of patient remaining vigilant. ?

## 2021-11-19 NOTE — Progress Notes (Signed)
? ?Subjective:  ? ? Patient ID: Erin [name], female    DOB: 01/09/1951, 71 y.o.   MRN: [id number] ? ?CC: Erin [name] is a 71 y.o. female who presents today for an acute visit.   ? ?HPI: She complains of 'not feeling right' x 5 days, feeling better.  ?She felt dizzy when bent over and stood up, since resolved. Symptom didn't 'last long'. She describes more noticeable feeling across chest, not sure if tightness.describes as ache. Not pressure or pain. Feels better with pressing on chest. No pain with moving arms around. Not associated with eating.  Improves and will resolve when walking. Dont injury, heavy lifting.  ?No cp, palpitations, epigastric pain, burping, abdominal pain, cough, sob, vertigo, ha, vision changes ? ?Chronic bilateral tinnitus.  ? ?She hasnt tried tylenol.  ?Yesterday started monitoring BP 116/81, pulse 112 after a walk yesterday.  ?121/82, 125/85, 133/90, 126/83 ? ?Physically active and walks an hour daily. No upper body weight lifting recently.  ? ?Never smoker ?No h/o asthma ?H/o right thyroidectomy due to cancer.  ? ?She follows with Dr Ronnald Collum , seen 09/2021 for annual labs and thyroid US. Per patient , known left thyroid is unchanged. She has paper copy of and TSH and thyroid antibodies. No record in epic.  ?HTN-compliant with amlodipine 10 mg ?Drinking plenty of water.  ? ?History of hypothyroidism and thyroid nodule.  She is compliant with levothyroxine 75 mcg ? ?TSH , 5 months ago 2.06 ?HISTORY:  ?Past Medical History:  ?Diagnosis Date  ? Cancer [facility]) 2011  ? thyroid, Dr. Carlis Abbott  ? GERD (gastroesophageal reflux disease)   ? Hyperlipidemia   ? Hx  ? Hypertension   ? PONV (postoperative nausea and vomiting)   ? Thyroid disease   ? ?Past Surgical History:  ?Procedure Laterality Date  ? CATARACT EXTRACTION W/PHACO Right 11/29/2019  ? Procedure: CATARACT EXTRACTION PHACO AND INTRAOCULAR LENS PLACEMENT ([place]) RIGHT VIVITY TORIC LENS 8.80 00:45.2 ;  Surgeon: Birder Robson, MD;  Location:  [place];  Service: Ophthalmology;  Laterality: Right;  ? CATARACT EXTRACTION W/PHACO Left 12/20/2019  ? Procedure: CATARACT EXTRACTION PHACO AND INTRAOCULAR LENS PLACEMENT ([facility]) LEFT VIVITY TORIC LENS 7.51  00:43.7;  Surgeon: Birder Robson, MD;  Location: [place];  Service: Ophthalmology;  Laterality: Left;  ? CHOLECYSTECTOMY    ? COLONOSCOPY WITH PROPOFOL N/A 12/23/2016  ? Procedure: COLONOSCOPY WITH PROPOFOL;  Surgeon: Lollie Sails, MD;  Location: [facility] ENDOSCOPY;  Service: Endoscopy;  Laterality: N/A;  ? GALLBLADDER SURGERY  2000  ? KNEE SURGERY  2003/ 2010  ? right knee x2 Dr. Marry Guan for torn meniscus  ? SHOULDER SURGERY  1990  ? right, arthritis  ? THYROIDECTOMY  2011  ? right side  ? ?Family History  ?Problem Relation Age of Onset  ? Stroke Mother   ? Heart disease Father   ? Heart attack Father   ? Cancer Maternal Grandmother   ?     pancreatic  ? Breast cancer Neg Hx   ? ? ?Allergies: Amoxicillin, Atorvastatin, and Codeine ?Current Outpatient Medications on File Prior to Visit  ?Medication Sig Dispense Refill  ? amitriptyline (ELAVIL) 50 MG tablet TAKE 1 TABLET BY MOUTH EVERYDAY AT BEDTIME 90 tablet 1  ? amLODipine (NORVASC) 10 MG tablet TAKE 1 TABLET BY MOUTH EVERY DAY 90 tablet 1  ? cholecalciferol (VITAMIN D) 1000 UNITS tablet Take 1,000 Units by mouth daily.    ? levothyroxine (SYNTHROID) 75 MCG tablet TAKE 1 TABLET (  75 MCG TOTAL) BY MOUTH DAILY BEFORE BREAKFAST. 90 tablet 3  ? Multiple Vitamins-Minerals (CENTRUM SILVER PO) Take by mouth.    ? omeprazole (PRILOSEC) 20 MG capsule TAKE 1 CAPSULE BY MOUTH EVERY DAY 90 capsule 2  ? ?No current facility-administered medications on file prior to visit.  ? ? ?Social History  ? ?Tobacco Use  ? Smoking status: Never  ? Smokeless tobacco: Never  ?Vaping Use  ? Vaping Use: Never used  ?Substance Use Topics  ? Alcohol use: Yes  ?  Alcohol/week: 0.0 standard drinks  ?  Comment: socially (1x/week)  ? Drug use: No  ? ? ?Review of Systems   ?Constitutional:  Negative for chills and fever.  ?Respiratory:  Negative for cough and shortness of breath.   ?Cardiovascular:  Negative for chest pain, palpitations and leg swelling.  ?Gastrointestinal:  Negative for nausea and vomiting.  ?   ?Objective:  ?  ?BP 130/72 (BP Location: Left Arm, Patient Position: Sitting, Cuff Size: Normal)   Pulse 99   Temp 98.7 ?F (37.1 ?C) (Oral)   Ht '5\' 3"'$  (1.6 m)   Wt 130 lb 6.4 oz (59.1 kg)   SpO2 98%   BMI 23.10 kg/m?  ? ? ?Physical Exam ?Vitals reviewed.  ?Constitutional:   ?   Appearance: She is well-developed.  ?Eyes:  ?   Conjunctiva/sclera: Conjunctivae normal.  ?Neck:  ?   Thyroid: No thyroid mass, thyromegaly or thyroid tenderness.  ?   Comments: Absent right thyroid ?Cardiovascular:  ?   Rate and Rhythm: Normal rate and regular rhythm.  ?   Pulses: Normal pulses.  ?   Heart sounds: Normal heart sounds.  ?Pulmonary:  ?   Effort: Pulmonary effort is normal.  ?   Breath sounds: Normal breath sounds. No wheezing, rhonchi or rales.  ?Chest:  ?   Chest wall: No mass, swelling, tenderness or edema.  ? ? ?   Comments: Area marked on diagram where patient feels ache ?Skin: ?   General: Skin is warm and dry.  ?Neurological:  ?   Mental Status: She is alert.  ?Psychiatric:     ?   Speech: Speech normal.     ?   Behavior: Behavior normal.     ?   Thought Content: Thought content normal.  ? ? ?   ?Assessment & Plan:  ? ? ?Problem List Items Addressed This Visit   ? ?  ? Other  ? Chest tightness - Primary  ?  Patient is well-appearing and nontoxic in appearance.  ? we discussed that symptoms and description remains vague at this time.  Etiology is unclear. Denies SOB.  No previous history of lung disease.  Interestingly "chest tightness" described as ache is relieved with deep palpation and with exercise.  Atypical presentation for costochondritis.  Question if this is musculoskeletal in etiology and advised her to trial  using heat, topical agents including Biofreeze or  Voltaren gel.  Pending chest x-ray, labs slightly. She is orthostatic based on DBP dropping 10 points. See flow sheet.  As dizziness is not a frequent or persistent occurrence, we jointly agreed that patient will continue to drink lots of water and be very conscientious when going from sitting to standing.  We may opt to decrease amlodipine in the future if symptom were to become persistent. ?EKG shows NSR.Nonspecific T wave change in lead V1.  no significant changes when compared to previous EKG Dec 17, 2017.  At that time she was seen  in Dr. Rockey Situ, cardiology for restratification.  She did not opt to have a CT calcium score at that time.   ?Based on family history, nonspecific EKG changes, elevated LDL cholesterol, we agreed consult for restratification and consideration for CT calcium score is appropriate.  I placed a referral to cardiology. ?Close follow-up and emphasized the importance of patient remaining vigilant. ? ?  ?  ? Relevant Orders  ? EKG 12-Lead (Completed)  ? DG Chest 2 View  ? Comprehensive metabolic panel  ? CBC with Differential/Platelet  ? TSH  ? Ambulatory referral to Cardiology  ? Hyperlipidemia  ? Relevant Orders  ? Ambulatory referral to Cardiology  ? ? ? ?I am having Leonora A. Atkerson maintain her cholecalciferol, Multiple Vitamins-Minerals (CENTRUM SILVER PO), levothyroxine, amitriptyline, amLODipine, and omeprazole. ? ? ?No orders of the defined types were placed in this encounter. ? ? ?Return precautions given.  ? ?Risks, benefits, and alternatives of the medications and treatment plan prescribed today were discussed, and patient expressed understanding.  ? ?Education regarding symptom management and diagnosis given to patient on AVS. ? ?Continue to follow with Leone Haven, MD for routine health maintenance.  ? ?Raynelle Highland Hollings and I agreed with plan.  ? ?Mable Paris, FNP ? ?I have spent 35 minutes with a patient including precharting, exam, reviewing medical records including  cardiology consult, and discussion plan of care regarding differential, treatment.  ?  ? ? ?

## 2021-11-19 NOTE — Patient Instructions (Addendum)
As discussed, your symptoms is nonspecific at this time.   ?We do not know etiology so I want you to stay extremely vigilant and certainly me know of any new concerns or new symptom development ?May trial heat, gentle massage, biofreeze, tylenol to see if relieves the pain as we did discuss that this is musculoskeletal etiology ? ?Referral to cardiology ?Denies exertional chest pain or pressure, numbness or tingling radiating to left arm or jaw, palpitations, dizziness, frequent headaches, changes in vision, or shortness of breath.  ? ? ? ?

## 2021-11-22 ENCOUNTER — Encounter: Payer: Self-pay | Admitting: Family

## 2021-11-25 ENCOUNTER — Encounter: Payer: Self-pay | Admitting: Family Medicine

## 2021-11-25 ENCOUNTER — Ambulatory Visit: Payer: Medicare HMO | Admitting: Cardiovascular Disease

## 2021-11-25 NOTE — Progress Notes (Deleted)
Cardiology Office Note  Date:  11/25/2021   ID:  Erin [name], DOB 11/01/1950, MRN [id number]  PCP:  Erin Haven, MD   No chief complaint on file.   HPI:  [name] is a 71 yo woman with PMH of  GERD Atypical chest pain Adjustment disorder anxiety depressed mood Nonsmoker, no diabetes Referred by  Erin [name] for chest tightness  Last seen in cardiology clinic May 2019 At that time with chest pain, had resolved by the time she was seen in the office No significant work-up at that time  Seen by primary care November 19, 2021 felt dizzy when bent over and stood up, since resolved. Symptom didn't 'last long'. She describes more noticeable feeling across chest, not sure if tightness.describes as ache. Not pressure or pain. Feels better with pressing on chest. No pain with moving arms around. Not associated with eating.  Improves and will resolve when walking. Dont injury, heavy lifting.       Typically she isActive,goes to the [facility], walks miles at a time without symptoms Does lots of cardio  Periodic anxiety Per the notes   EKG personally reviewed by myself on todays visit Shows normal sinus rhythm rate 89 bpm no significant ST or T-wave changes  History of father with MI at age 3, died, smoker,  Mom with CVA, non smoker  PMH:   has a past medical history of Cancer ([place]) (2011), GERD (gastroesophageal reflux disease), Hyperlipidemia, Hypertension, PONV (postoperative nausea and vomiting), and Thyroid disease.  PSH:    Past Surgical History:  Procedure Laterality Date   CATARACT EXTRACTION W/PHACO Right 11/29/2019   Procedure: CATARACT EXTRACTION PHACO AND INTRAOCULAR LENS PLACEMENT ([place]) RIGHT VIVITY TORIC LENS 8.80 00:45.2 ;  Surgeon: Birder Robson, MD;  Location: [place];  Service: Ophthalmology;  Laterality: Right;   CATARACT EXTRACTION W/PHACO Left 12/20/2019   Procedure: CATARACT EXTRACTION PHACO AND INTRAOCULAR LENS PLACEMENT ([facility]) LEFT  VIVITY TORIC LENS 7.51  00:43.7;  Surgeon: Birder Robson, MD;  Location: [place];  Service: Ophthalmology;  Laterality: Left;   CHOLECYSTECTOMY     COLONOSCOPY WITH PROPOFOL N/A 12/23/2016   Procedure: COLONOSCOPY WITH PROPOFOL;  Surgeon: Lollie Sails, MD;  Location[facility] ENDOSCOPY;  Service: Endoscopy;  Laterality: N/A;   GALLBLADDER SURGERY  2000   KNEE SURGERY  2003/ 2010   right knee x2 Dr. Marry Guan for torn meniscus   SHOULDER SURGERY  1990   right, arthritis   THYROIDECTOMY  2011   right side    Current Outpatient Medications  Medication Sig Dispense Refill   amitriptyline (ELAVIL) 50 MG tablet TAKE 1 TABLET BY MOUTH EVERYDAY AT BEDTIME 90 tablet 1   amLODipine (NORVASC) 10 MG tablet TAKE 1 TABLET BY MOUTH EVERY DAY 90 tablet 1   cholecalciferol (VITAMIN D) 1000 UNITS tablet Take 1,000 Units by mouth daily.     levothyroxine (SYNTHROID) 75 MCG tablet TAKE 1 TABLET (75 MCG TOTAL) BY MOUTH DAILY BEFORE BREAKFAST. 90 tablet 3   Multiple Vitamins-Minerals (CENTRUM SILVER PO) Take by mouth.     omeprazole (PRILOSEC) 20 MG capsule TAKE 1 CAPSULE BY MOUTH EVERY DAY 90 capsule 2   No current facility-administered medications for this visit.     Allergies:   Amoxicillin, Atorvastatin, and Codeine   Social History:  The patient  reports that she has never smoked. She has never used smokeless tobacco. She reports current alcohol use. She reports that she does not use drugs.   Family  History:   family history includes Cancer in her maternal grandmother; Heart attack in her father; Heart disease in her father; Stroke in her mother.    Review of Systems: Review of Systems  Constitutional: Negative.   Respiratory: Negative.    Cardiovascular: Negative.        Chest pressure  Gastrointestinal: Negative.   Musculoskeletal: Negative.   Neurological: Negative.   Psychiatric/Behavioral: Negative.    All other systems reviewed and are negative.   PHYSICAL EXAM: VS:   There were no vitals taken for this visit. , BMI There is no height or weight on file to calculate BMI. GEN: Well nourished, well developed, in no acute distress  HEENT: normal  Neck: no JVD, carotid bruits, or masses Cardiac: RRR; no murmurs, rubs, or gallops,no edema  Respiratory:  clear to auscultation bilaterally, normal work of breathing GI: soft, nontender, nondistended, + BS MS: no deformity or atrophy  Skin: warm and dry, no rash Neuro:  Strength and sensation are intact Psych: euthymic mood, full affect    Recent Labs: 11/19/2021: ALT 18; BUN 15; Creatinine, Ser 0.76; Hemoglobin 15.3; Platelets 293.0; Potassium 4.0; Sodium 140; TSH 1.32    Lipid Panel Lab Results  Component Value Date   CHOL 246 (A) 08/29/2021   HDL 39 08/29/2021   LDLCALC 128 (H) 06/03/2021   TRIG 195 (A) 08/29/2021      Wt Readings from Last 3 Encounters:  11/19/21 130 lb 6.4 oz (59.1 kg)  06/03/21 126 lb 12.8 oz (57.5 kg)  03/07/21 125 lb (56.7 kg)       ASSESSMENT AND PLAN:  Chest pain  Plan: EKG 12-Lead Strong family history though father was a smoker Father had strokes She reports some chest pressure early April but symptoms have resolved Back to full exercise, aerobic activities without symptoms EKG and clinical exam are normal Feels her symptoms were secondary to abnormal thyroid Discussed various types of testing including stress tests and screening studies such as CT coronary calcium scoring She'll call us if she would like to CT coronary calcium score  Adjustment disorder with mixed anxiety and depressed mood Managed by primary care Exercising  Mixed hyperlipidemia Mildly elevated cholesterol, not on a statin Discussed with her at length, Would leave it up to her whether she would like risk stratification study to help guide management of her cholesterol such as with CT coronary calcium scoring  Hypothyroidism, unspecified type Recent medication adjustment down to 88 Feels  better  Gastroesophageal reflux disease, esophagitis presence not specified On proton pump inhibitor  Disposition:   F/U  As needed   Total encounter time more than 60 minutes  Greater than 50% was spent in counseling and coordination of care with the patient    No orders of the defined types were placed in this encounter.    Signed, Esmond Plants, M.D., Ph.D. 11/25/2021  [place], [place]

## 2021-12-02 ENCOUNTER — Ambulatory Visit (INDEPENDENT_AMBULATORY_CARE_PROVIDER_SITE_OTHER): Payer: Medicare HMO | Admitting: Family Medicine

## 2021-12-02 ENCOUNTER — Encounter: Payer: Self-pay | Admitting: Family Medicine

## 2021-12-02 DIAGNOSIS — E039 Hypothyroidism, unspecified: Secondary | ICD-10-CM

## 2021-12-02 DIAGNOSIS — D582 Other hemoglobinopathies: Secondary | ICD-10-CM | POA: Diagnosis not present

## 2021-12-02 DIAGNOSIS — R0789 Other chest pain: Secondary | ICD-10-CM

## 2021-12-02 DIAGNOSIS — I10 Essential (primary) hypertension: Secondary | ICD-10-CM

## 2021-12-02 DIAGNOSIS — E782 Mixed hyperlipidemia: Secondary | ICD-10-CM

## 2021-12-02 LAB — CBC WITH DIFFERENTIAL/PLATELET
Basophils Absolute: 0.1 10*3/uL (ref 0.0–0.1)
Basophils Relative: 0.8 % (ref 0.0–3.0)
Eosinophils Absolute: 0.3 10*3/uL (ref 0.0–0.7)
Eosinophils Relative: 3.7 % (ref 0.0–5.0)
HCT: 42.9 % (ref 36.0–46.0)
Hemoglobin: 14.5 g/dL (ref 12.0–15.0)
Lymphocytes Relative: 24.5 % (ref 12.0–46.0)
Lymphs Abs: 1.9 10*3/uL (ref 0.7–4.0)
MCHC: 33.9 g/dL (ref 30.0–36.0)
MCV: 91.6 fl (ref 78.0–100.0)
Monocytes Absolute: 0.4 10*3/uL (ref 0.1–1.0)
Monocytes Relative: 5.4 % (ref 3.0–12.0)
Neutro Abs: 5.2 10*3/uL (ref 1.4–7.7)
Neutrophils Relative %: 65.6 % (ref 43.0–77.0)
Platelets: 276 10*3/uL (ref 150.0–400.0)
RBC: 4.68 Mil/uL (ref 3.87–5.11)
RDW: 14 % (ref 11.5–15.5)
WBC: 7.9 10*3/uL (ref 4.0–10.5)

## 2021-12-02 MED ORDER — ROSUVASTATIN CALCIUM 5 MG PO TABS: ORAL_TABLET | ORAL | 3 refills | Status: DC

## 2021-12-02 MED ORDER — AMLODIPINE BESYLATE 10 MG PO TABS
5.0000 mg | ORAL_TABLET | Freq: Every day | ORAL | 1 refills | Status: DC
Start: 2021-12-02 — End: 2021-12-03

## 2021-12-02 NOTE — Assessment & Plan Note (Signed)
Minimally elevated.  Has been relatively stable over the last several years.  Discussed monitoring at this time.  If she has any significant changes in her lab values we would consider hematology evaluation.  Orders placed. ?

## 2021-12-02 NOTE — Assessment & Plan Note (Addendum)
Adequately controlled.  Given her recent lightheadedness we will reduce her amlodipine to 5 mg once daily.  She will monitor her blood pressure.  Discussed a goal of less than 140/90.  Reducing her amlodipine dose may help with her swelling as well.  If the lightheadedness and swelling do not improve with reducing the amlodipine dose she will let us know.  If her blood pressure trends up over 140/90 she will let us know. ?

## 2021-12-02 NOTE — Progress Notes (Signed)
?[name], MD ?Phone: [phone] ? ?Erin [name] is a 71 y.o. female who presents today for f/u. ? ?HYPERTENSION ?Disease Monitoring ?Home BP Monitoring 120s/80s Chest pain- no recurrence   Dyspnea- no ?Medications ?Compliance-  taking amlodipine. Lightheadedness-  yes  Edema- occasionally ?BMET ?   ?Component Value Date/Time  ? NA 140 11/19/2021 1139  ? NA 141 08/29/2021 0000  ? NA 141 04/20/2012 0708  ? K 4.0 11/19/2021 1139  ? K 4.0 04/20/2012 0708  ? CL 103 11/19/2021 1139  ? CL 106 04/20/2012 0708  ? CO2 28 11/19/2021 1139  ? CO2 30 04/20/2012 0708  ? GLUCOSE 113 (H) 11/19/2021 1139  ? GLUCOSE 104 (H) 04/20/2012 0708  ? BUN 15 11/19/2021 1139  ? BUN 12 08/29/2021 0000  ? BUN 17 04/20/2012 0708  ? CREATININE 0.76 11/19/2021 1139  ? CREATININE 0.76 04/20/2012 0708  ? CALCIUM 9.5 11/19/2021 1139  ? CALCIUM 9.4 04/20/2012 0708  ? GFRNONAA >60 07/06/2019 0930  ? GFRNONAA >60 04/20/2012 0708  ? GFRAA >60 07/06/2019 0930  ? GFRAA >60 04/20/2012 0708  ? ?Chest discomfort: Patient notes she had no recurrence.  She reports it was an odd sensation in her chest.  She has an appointment with cardiology in the near future. ? ?Edema: Patient reports some lower extremity edema that occurs occasionally if she stands a lot.  Goes down with elevating her legs.  No PND or orthopnea. ? ?Lightheadedness: This happens in the morning and occurs intermittently throughout the day after she takes her blood pressure medicines. ? ?Hypertriglyceridemia: Patient reports triglycerides were around 195 with her last labs.  She does not eat a lot of carbohydrates.  Not much alcohol intake.  She was not fasting for that test. ? ?Hypothyroidism: She is taking Synthroid.  No skin changes.  Occasional heat and cold intolerance. ? ?Elevated hemoglobin: Minimally elevated on recent labs.  Has been relatively stable over the last 2 years.  She does not smoke. ? ?Social History  ? ?Tobacco Use  ?Smoking Status Never  ?Smokeless Tobacco Never   ? ? ?Current Outpatient Medications on File Prior to Visit  ?Medication Sig Dispense Refill  ? amitriptyline (ELAVIL) 50 MG tablet TAKE 1 TABLET BY MOUTH EVERYDAY AT BEDTIME 90 tablet 1  ? cholecalciferol (VITAMIN D) 1000 UNITS tablet Take 1,000 Units by mouth daily.    ? levothyroxine (SYNTHROID) 75 MCG tablet TAKE 1 TABLET (75 MCG TOTAL) BY MOUTH DAILY BEFORE BREAKFAST. 90 tablet 3  ? Multiple Vitamins-Minerals (CENTRUM SILVER PO) Take by mouth.    ? omeprazole (PRILOSEC) 20 MG capsule TAKE 1 CAPSULE BY MOUTH EVERY DAY 90 capsule 2  ? ?No current facility-administered medications on file prior to visit.  ? ? ? ?ROS see history of present illness ? ?Objective ? ?Physical Exam ?Vitals:  ? 12/02/21 0809  ?BP: 110/70  ?Pulse: 82  ?Temp: 98.1 ?F (36.7 ?C)  ?SpO2: 98%  ? ?Lying blood pressure 116/68 pulse 72 ?Sitting blood pressure 102/68 pulse 79 ?Standing blood pressure 102/64 pulse 86 ? ?BP Readings from Last 3 Encounters:  ?12/02/21 110/70  ?11/19/21 130/72  ?07/06/21 130/80  ? ?Wt Readings from Last 3 Encounters:  ?12/02/21 130 lb 12.8 oz (59.3 kg)  ?11/19/21 130 lb 6.4 oz (59.1 kg)  ?06/03/21 126 lb 12.8 oz (57.5 kg)  ? ? ?Physical Exam ?Constitutional:   ?   General: She is not in acute distress. ?   Appearance: She is not diaphoretic.  ?Cardiovascular:  ?  Rate and Rhythm: Normal rate and regular rhythm.  ?   Heart sounds: Normal heart sounds.  ?Pulmonary:  ?   Effort: Pulmonary effort is normal.  ?   Breath sounds: Normal breath sounds.  ?Musculoskeletal:  ?   Right lower leg: No edema.  ?   Left lower leg: No edema.  ?Skin: ?   General: Skin is warm and dry.  ?Neurological:  ?   Mental Status: She is alert.  ? ? ? ?Assessment/Plan: Please see individual problem list. ? ?Problem List Items Addressed This Visit   ? ? Chest tightness  ?  No recurrence.  She will see cardiology as planned. ? ?  ?  ? Elevated hemoglobin (HCC)  ?  Minimally elevated.  Has been relatively stable over the last several years.   Discussed monitoring at this time.  If she has any significant changes in her lab values we would consider hematology evaluation.  Orders placed. ? ?  ?  ? Relevant Orders  ? CBC w/Diff  ? Hyperlipidemia  ?  Reports triglycerides minimally elevated previously.  She was nonfasting.  Discussed no medication is needed for her triglycerides though we could consider a statin for her LDL.  She has had arthralgias with Lipitor.  We will trial Crestor 5 mg 3 days a week.  CMA will contact the patient to schedule her for lab work in 6 weeks for her cholesterol. ? ?  ?  ? Relevant Medications  ? amLODipine (NORVASC) 10 MG tablet  ? rosuvastatin (CRESTOR) 5 MG tablet  ? Other Relevant Orders  ? LDL cholesterol, direct  ? Hepatic function panel  ? Hypertension  ?  Adequately controlled.  Given her recent lightheadedness we will reduce her amlodipine to 5 mg once daily.  She will monitor her blood pressure.  Discussed a goal of less than 140/90.  Reducing her amlodipine dose may help with her swelling as well.  If the lightheadedness and swelling do not improve with reducing the amlodipine dose she will let us know.  If her blood pressure trends up over 140/90 she will let us know. ? ?  ?  ? Relevant Medications  ? amLODipine (NORVASC) 10 MG tablet  ? rosuvastatin (CRESTOR) 5 MG tablet  ? Hypothyroidism  ?  She will continue Synthroid 75 mcg once daily. ? ?  ?  ? ? ? ?Health Maintenance: Patient declined pneumonia vaccine, Shingrix vaccine, and COVID-vaccine.  Discussed that I would recommend that she get all of these. ? ?Return in about 3 months (around 03/04/2022) for Hypertension follow-up. ? ?This visit occurred during the SARS-CoV-2 public health emergency.  Safety protocols were in place, including screening questions prior to the visit, additional usage of staff PPE, and extensive cleaning of exam room while observing appropriate contact time as indicated for disinfecting solutions.  ? ? ?[name], MD ?[place] ? ?

## 2021-12-02 NOTE — Assessment & Plan Note (Addendum)
Reports triglycerides minimally elevated previously.  She was nonfasting.  Discussed no medication is needed for her triglycerides though we could consider a statin for her LDL.  She has had arthralgias with Lipitor.  We will trial Crestor 5 mg 3 days a week.  CMA will contact the patient to schedule her for lab work in 6 weeks for her cholesterol. ?

## 2021-12-02 NOTE — Assessment & Plan Note (Signed)
She will continue Synthroid 75 mcg once daily. ?

## 2021-12-02 NOTE — Assessment & Plan Note (Signed)
No recurrence.  She will see cardiology as planned. ?

## 2021-12-02 NOTE — Patient Instructions (Signed)
Nice to see you. ?We will contact you with your lab results. ?Please reduce your amlodipine dose to 5 mg once daily.  If your lightheadedness and swelling does not improve with this dose reduction please let me know. ?Please see cardiology as planned. ?

## 2021-12-03 ENCOUNTER — Encounter: Payer: Self-pay | Admitting: Family Medicine

## 2021-12-03 MED ORDER — AMLODIPINE BESYLATE 5 MG PO TABS: 5.0000 mg | ORAL_TABLET | Freq: Every day | ORAL | 1 refills | Status: DC

## 2021-12-06 ENCOUNTER — Ambulatory Visit: Payer: Medicare HMO | Admitting: Family Medicine

## 2021-12-20 NOTE — Progress Notes (Signed)
Cardiology Office Note  Date:  12/23/2021   ID:  Erin [name], DOB 16-Dec-1950, MRN [id number]  PCP:  Erin Haven, MD   Chief Complaint  Patient presents with   New Patient (Initial Visit)    Ref by Erin Paris, FNP for chest pain. Patient c/o having some spells of chest pain that would come and go. Medications reviewed by the patient verbally.     HPI:  [name] is a 71 yo woman with PMH of  GERD chest pain Adjustment disorder anxiety depressed mood Nonsmoker, no diabetes Strong family history cardiac disease Referred by  Erin [name] for chest tightness  Last seen in cardiology clinic May 2019 At that time with chest pain, had resolved by the time she was seen in the office No significant work-up at that time  Seen by primary care November 19, 2021 felt dizzy when bent over and stood up, since resolved. Symptom didn't 'last long'. She describes more noticeable feeling across chest, not sure if tightness.describes as ache. Not pressure or pain. Feels better with pressing on chest. No pain with moving arms around. Not associated with eating.  Improves and will resolve when walking. Dont injury, heavy lifting.   In follow-up today reports having some episodes of chest discomfort Like she overused the chest muscles, random in [place] Walks couple miles on track, unable to bring on the chest pain symptoms with her exercise regiment Does rub her bands, unclear if this is contributing  History of hyperlipidemia Total chol 246 Recently started on crestor QOD  EKG personally reviewed by myself on todays visit Shows normal sinus rhythm rate 85 bpm no significant ST or T-wave changes  History of father with MI at age 74, died, smoker,  Mom with CVA, non smoker  PMH:   has a past medical history of Cancer ([place]) (2011), GERD (gastroesophageal reflux disease), Hyperlipidemia, Hypertension, PONV (postoperative nausea and vomiting), and Thyroid disease.  PSH:    Past  Surgical History:  Procedure Laterality Date   CATARACT EXTRACTION W/PHACO Right 11/29/2019   Procedure: CATARACT EXTRACTION PHACO AND INTRAOCULAR LENS PLACEMENT ([place]) RIGHT VIVITY TORIC LENS 8.80 00:45.2 ;  Surgeon: Birder Robson, MD;  Location: [facility];  Service: Ophthalmology;  Laterality: Right;   CATARACT EXTRACTION W/PHACO Left 12/20/2019   Procedure: CATARACT EXTRACTION PHACO AND INTRAOCULAR LENS PLACEMENT ([facility]) LEFT VIVITY TORIC LENS 7.51  00:43.7;  Surgeon: Birder Robson, MD;  Location: [facility];  Service: Ophthalmology;  Laterality: Left;   CHOLECYSTECTOMY     COLONOSCOPY WITH PROPOFOL N/A 12/23/2016   Procedure: COLONOSCOPY WITH PROPOFOL;  Surgeon: Lollie Sails, MD;  Location: [facility];  Service: Endoscopy;  Laterality: N/A;   GALLBLADDER SURGERY  2000   KNEE SURGERY  2003/ 2010   right knee x2 Dr. Marry Guan for torn meniscus   SHOULDER SURGERY  1990   right, arthritis   THYROIDECTOMY  2011   right side    Current Outpatient Medications  Medication Sig Dispense Refill   amitriptyline (ELAVIL) 50 MG tablet TAKE 1 TABLET BY MOUTH EVERYDAY AT BEDTIME 90 tablet 1   amLODipine (NORVASC) 5 MG tablet Take 1 tablet (5 mg total) by mouth daily. 90 tablet 1   cholecalciferol (VITAMIN D) 1000 UNITS tablet Take 1,000 Units by mouth daily.     levothyroxine (SYNTHROID) 75 MCG tablet TAKE 1 TABLET (75 MCG TOTAL) BY MOUTH DAILY BEFORE BREAKFAST. 90 tablet 3   Multiple Vitamins-Minerals (CENTRUM SILVER PO) Take by mouth.  omeprazole (PRILOSEC) 20 MG capsule TAKE 1 CAPSULE BY MOUTH EVERY DAY 90 capsule 2   rosuvastatin (CRESTOR) 5 MG tablet Take 5 mg (1 tablet) by mouth on Monday, Wednesday, and Friday 36 tablet 3   No current facility-administered medications for this visit.     Allergies:   Amoxicillin, Atorvastatin, and Codeine   Social History:  The patient  reports that she has never smoked. She has never used smokeless tobacco. She reports  current alcohol use. She reports that she does not use drugs.   Family History:   family history includes Cancer in her maternal grandmother; Heart attack in her father; Heart disease in her father; Stroke in her mother.    Review of Systems: Review of Systems  Constitutional: Negative.   Respiratory: Negative.    Cardiovascular: Negative.        Chest pressure  Gastrointestinal: Negative.   Musculoskeletal: Negative.   Neurological: Negative.   Psychiatric/Behavioral: Negative.    All other systems reviewed and are negative.   PHYSICAL EXAM: VS:  BP 110/76 (BP Location: Right Arm, Patient Position: Sitting, Cuff Size: Normal)   Pulse 85   Ht '5\' 3"'$  (1.6 m)   Wt 129 lb 6 oz (58.7 kg)   SpO2 94%   BMI 22.92 kg/m  , BMI Body mass index is 22.92 kg/m. GEN: Well nourished, well developed, in no acute distress  HEENT: normal  Neck: no JVD, carotid bruits, or masses Cardiac: RRR; no murmurs, rubs, or gallops,no edema  Respiratory:  clear to auscultation bilaterally, normal work of breathing GI: soft, nontender, nondistended, + BS MS: no deformity or atrophy  Skin: warm and dry, no rash Neuro:  Strength and sensation are intact Psych: euthymic mood, full affect  Recent Labs: 11/19/2021: ALT 18; BUN 15; Creatinine, Ser 0.76; Potassium 4.0; Sodium 140; TSH 1.32 12/02/2021: Hemoglobin 14.5; Platelets 276.0    Lipid Panel Lab Results  Component Value Date   CHOL 246 (A) 08/29/2021   HDL 39 08/29/2021   LDLCALC 128 (H) 06/03/2021   TRIG 195 (A) 08/29/2021      Wt Readings from Last 3 Encounters:  12/23/21 129 lb 6 oz (58.7 kg)  12/02/21 130 lb 12.8 oz (59.3 kg)  11/19/21 130 lb 6.4 oz (59.1 kg)       ASSESSMENT AND PLAN:  Chest pain  Plan: EKG 12-Lead Strong family history  Chest pain dating back quite some time, seems to have recurred History of hyperlipidemia Unable to exclude angina After long discussion concerning various treatment options, she is indicated she  prefers a accurate evaluation of her cardiac status Cardiac CTA ordered at her request  Adjustment disorder with mixed anxiety and depressed mood Managed by primary care Exercising on a regular basis  Mixed hyperlipidemia Cholesterol 240 Recently started on low-dose Crestor every other day  Hypothyroidism, unspecified type Management primary care  Gastroesophageal reflux disease, esophagitis presence not specified On proton pump inhibitor   Total encounter time more than 60 minutes  Greater than 50% was spent in counseling and coordination of care with the patient    No orders of the defined types were placed in this encounter.    Signed, Esmond Plants, M.D., Ph.D. 12/23/2021  [place], [place]

## 2021-12-23 ENCOUNTER — Other Ambulatory Visit
Admission: RE | Admit: 2021-12-23 | Discharge: 2021-12-23 | Disposition: A | Discharge status: Home / Self Care | Payer: Medicare HMO | Attending: Cardiovascular Disease | Admitting: Cardiovascular Disease

## 2021-12-23 ENCOUNTER — Encounter: Payer: Self-pay | Admitting: Cardiovascular Disease

## 2021-12-23 ENCOUNTER — Ambulatory Visit: Payer: Medicare HMO | Admitting: Cardiovascular Disease

## 2021-12-23 VITALS — BP 110/76 | HR 85 | Ht 63.0 in | Wt 129.4 lb

## 2021-12-23 DIAGNOSIS — R0789 Other chest pain: Secondary | ICD-10-CM | POA: Insufficient documentation

## 2021-12-23 DIAGNOSIS — I10 Essential (primary) hypertension: Secondary | ICD-10-CM

## 2021-12-23 DIAGNOSIS — R072 Precordial pain: Secondary | ICD-10-CM

## 2021-12-23 DIAGNOSIS — E782 Mixed hyperlipidemia: Secondary | ICD-10-CM

## 2021-12-23 DIAGNOSIS — R002 Palpitations: Secondary | ICD-10-CM

## 2021-12-23 LAB — BASIC METABOLIC PANEL
Anion gap: 10 (ref 5–15)
BUN: 15 mg/dL (ref 8–23)
CO2: 28 mmol/L (ref 22–32)
Calcium: 9.8 mg/dL (ref 8.9–10.3)
Chloride: 103 mmol/L (ref 98–111)
Creatinine, Ser: 0.79 mg/dL (ref 0.44–1.00)
GFR, Estimated: >60 mL/min (ref >60)
Glucose, Bld: 114 mg/dL — ABNORMAL HIGH (ref 70–99)
Potassium: 4.3 mmol/L (ref 3.5–5.1)
Sodium: 141 mmol/L (ref 135–145)

## 2021-12-23 MED ORDER — IVABRADINE HCL 7.5 MG PO TABS: 15.0000 mg | ORAL_TABLET | Freq: Once | ORAL | 0 refills | Status: AC

## 2021-12-23 MED ORDER — METOPROLOL TARTRATE 100 MG PO TABS: 100.0000 mg | ORAL_TABLET | Freq: Once | ORAL | 0 refills | Status: DC

## 2021-12-23 NOTE — Patient Instructions (Addendum)
Medication Instructions:  No changes  If you need a refill on your cardiac medications before your next appointment, please call your pharmacy.   Lab work: Today: BMP at [place] at [facility] 1st desk on the right to check in (REGISTRATION)  Lab hours: Monday- Friday (7:30 am- 5:30 pm)   Testing/Procedures: Your cardiac CT has been scheduled for May 25th at 2pm at:   [facility] [place], [place] [address] [phone]  Please arrive 15 mins early for check-in and test prep.    Please follow these instructions carefully (unless otherwise directed):   On the Night Before the Test: Be sure to Drink plenty of water. Do not consume any caffeinated/decaffeinated beverages or chocolate 12 hours prior to your test.   On the Day of the Test: Drink plenty of water until 1 hour prior to the test. Do not eat any food 4 hours prior to the test. You may take your regular medications prior to the test.  Take metoprolol (Lopressor) '25mg'$  and ivabradine (Corlanor) '15mg'$  two hours prior to test. These have been sent into your pharmacy. FEMALES- please wear underwire-free bra if available, avoid dresses & tight clothing       After the Test: Drink plenty of water. After receiving IV contrast, you may experience a mild flushed feeling. This is normal. On occasion, you may experience a mild rash up to 24 hours after the test. This is not dangerous. If this occurs, you can take Benadryl 25 mg and increase your fluid intake. If you experience trouble breathing, this can be serious. If it is severe call 911 IMMEDIATELY. If it is mild, please call our office.  Please allow 2-4 weeks for scheduling of routine cardiac CTs. Some insurance companies require a pre-authorization which may delay scheduling of this test.   For non-scheduling related questions, please contact the cardiac imaging nurse navigator should you have  any questions/concerns: Marchia Bond, Cardiac Imaging Nurse Navigator Gordy Clement, Cardiac Imaging Nurse Navigator [facility] Direct Office Dial: [phone]   For scheduling needs, including cancellations and rescheduling, please call [place], [phone].    Follow-Up: At [facility], you and your health needs are our priority.  As part of our continuing mission to provide you with exceptional heart care, we have created designated Provider Care Teams.  These Care Teams include your primary Cardiologist (physician) and Advanced Practice Providers (APPs -  Physician Assistants and Nurse Practitioners) who all work together to provide you with the care you need, when you need it.  You will need a follow up appointment as needed  Providers on your designated Care Team:   Murray Hodgkins, NP Christell Faith, PA-C Cadence Kathlen Mody, [place]  COVID-19 Vaccine Information can be found at: ShippingScam.co.uk For questions related to vaccine distribution or appointments, please email [email or web address] or call [phone].

## 2021-12-25 ENCOUNTER — Telehealth (HOSPITAL_COMMUNITY): Payer: Self-pay | Admitting: Emergency Medicine

## 2021-12-25 DIAGNOSIS — R0789 Other chest pain: Secondary | ICD-10-CM

## 2021-12-25 MED ORDER — METOPROLOL TARTRATE 25 MG PO TABS: 25.0000 mg | ORAL_TABLET | Freq: Once | ORAL | 0 refills | Status: DC

## 2021-12-25 NOTE — Telephone Encounter (Signed)
Attempted to call patient regarding upcoming cardiac CT appointment. °Left message on voicemail with name and callback number °Tristen Pennino RN Navigator Cardiac Imaging °[facility] °[phone] Office °[phone] Cell ° °

## 2021-12-25 NOTE — Telephone Encounter (Signed)
Reaching out to patient to offer assistance regarding upcoming cardiac imaging study; pt verbalizes understanding of appt date/time, parking situation and where to check in, pre-test NPO status and medications ordered, and verified current allergies; name and call back number provided for further questions should they arise Marchia Bond RN Navigator Cardiac Imaging [facility] [phone] office [phone] cell   '25mg'$  metoprolol + '15mg'$  ivabradine Denies iv issues  Arrival 145

## 2021-12-26 ENCOUNTER — Ambulatory Visit
Admission: RE | Admit: 2021-12-26 | Discharge: 2021-12-26 | Disposition: A | Discharge status: Home / Self Care | Payer: Medicare HMO | Source: Ambulatory Visit | Attending: Cardiovascular Disease | Admitting: Cardiovascular Disease

## 2021-12-26 DIAGNOSIS — R072 Precordial pain: Secondary | ICD-10-CM | POA: Insufficient documentation

## 2021-12-26 MED ORDER — NITROGLYCERIN 0.4 MG SL SUBL
0.8000 mg | SUBLINGUAL_TABLET | Freq: Once | SUBLINGUAL | Status: AC
  Administered 2021-12-26: 0.8 mg via SUBLINGUAL

## 2021-12-26 MED ORDER — IOHEXOL 350 MG/ML SOLN
75.0000 mL | Freq: Once | INTRAVENOUS | Status: AC | PRN
  Administered 2021-12-26: 75 mL via INTRAVENOUS

## 2021-12-26 MED ORDER — METOPROLOL TARTRATE 5 MG/5ML IV SOLN
10.0000 mg | Freq: Once | INTRAVENOUS | Status: AC
Start: 2021-12-26 — End: 2021-12-26
  Administered 2021-12-26: 10 mg via INTRAVENOUS

## 2021-12-26 NOTE — Progress Notes (Signed)
Patient tolerated procedure well. Ambulate w/o difficulty. Denies light headedness or being dizzy. Sitting in chair drinking water provided. Encouraged to drink extra water today and reasoning explained. Verbalized understanding. All questions answered. ABC intact. No further needs. Discharge from procedure area w/o issues.   °

## 2022-01-15 ENCOUNTER — Encounter: Payer: Self-pay | Admitting: *Deleted

## 2022-01-15 DIAGNOSIS — Z006 Encounter for examination for normal comparison and control in clinical research program: Secondary | ICD-10-CM

## 2022-01-15 NOTE — Research (Signed)
Message left with information about [employer] research . Encouraged her to call back.

## 2022-02-05 ENCOUNTER — Other Ambulatory Visit: Payer: Self-pay | Admitting: Family Medicine

## 2022-03-04 ENCOUNTER — Ambulatory Visit (INDEPENDENT_AMBULATORY_CARE_PROVIDER_SITE_OTHER): Payer: Medicare HMO | Admitting: Family Medicine

## 2022-03-04 ENCOUNTER — Encounter: Payer: Self-pay | Admitting: Family Medicine

## 2022-03-04 VITALS — BP 100/60 | HR 90 | Temp 98.3°F | Ht 63.0 in | Wt 128.0 lb

## 2022-03-04 DIAGNOSIS — K219 Gastro-esophageal reflux disease without esophagitis: Secondary | ICD-10-CM | POA: Diagnosis not present

## 2022-03-04 DIAGNOSIS — E782 Mixed hyperlipidemia: Secondary | ICD-10-CM | POA: Diagnosis not present

## 2022-03-04 DIAGNOSIS — I10 Essential (primary) hypertension: Secondary | ICD-10-CM | POA: Diagnosis not present

## 2022-03-04 LAB — HEPATIC FUNCTION PANEL
ALT: 17 U/L (ref 0–35)
AST: 21 U/L (ref 0–37)
Albumin: 4.3 g/dL (ref 3.5–5.2)
Alkaline Phosphatase: 67 U/L (ref 39–117)
Bilirubin, Direct: 0.1 mg/dL (ref 0.0–0.3)
Total Bilirubin: 0.4 mg/dL (ref 0.2–1.2)
Total Protein: 6.7 g/dL (ref 6.0–8.3)

## 2022-03-04 LAB — LDL CHOLESTEROL, DIRECT: Direct LDL: 85 mg/dL

## 2022-03-04 NOTE — Assessment & Plan Note (Addendum)
Tolerating Crestor.  She will continue Crestor 5 mg Monday Wednesday and Friday.  Check labs today.  I encouraged continued diet and exercise.

## 2022-03-04 NOTE — Progress Notes (Signed)
[name], MD Phone: [phone]  Erin [name] is a 71 y.o. female who presents today for f/u.  HYPERTENSION Disease Monitoring Home BP Monitoring not checked recently Chest pain- no    Dyspnea- no Medications Compliance-  taking amlodipine.   Edema- only if seated for a long period of time, resolves with propping legs up.  BMET    Component Value Date/Time   NA 141 12/23/2021 1633   NA 141 08/29/2021 0000   NA 141 04/20/2012 0708   K 4.3 12/23/2021 1633   K 4.0 04/20/2012 0708   CL 103 12/23/2021 1633   CL 106 04/20/2012 0708   CO2 28 12/23/2021 1633   CO2 30 04/20/2012 0708   GLUCOSE 114 (H) 12/23/2021 1633   GLUCOSE 104 (H) 04/20/2012 0708   BUN 15 12/23/2021 1633   BUN 12 08/29/2021 0000   BUN 17 04/20/2012 0708   CREATININE 0.79 12/23/2021 1633   CREATININE 0.76 04/20/2012 0708   CALCIUM 9.8 12/23/2021 1633   CALCIUM 9.4 04/20/2012 0708   GFRNONAA >60 12/23/2021 1633   GFRNONAA >60 04/20/2012 0708   GFRAA >60 07/06/2019 0930   GFRAA >60 04/20/2012 0708   HYPERLIPIDEMIA Symptoms Chest pain on exertion:  no   Medications: Compliance- taking crestor Right upper quadrant pain- no  Muscle aches- no Lipid Panel     Component Value Date/Time   CHOL 246 (A) 08/29/2021 0000   CHOL 144 04/20/2012 0708   TRIG 195 (A) 08/29/2021 0000   TRIG 129 04/20/2012 0708   HDL 39 08/29/2021 0000   HDL 38 (L) 04/20/2012 0708   CHOLHDL 5 06/03/2021 0831   VLDL 36.0 06/03/2021 0831   VLDL 26 04/20/2012 0708   LDLCALC 128 (H) 06/03/2021 0831   LDLCALC 80 04/20/2012 0708   GERD:   Reflux symptoms: no   Abd pain: no   Blood in stool: no  Dysphagia: no   EGD: in the past, had an ulcer, has been on omeprazole since then  Medication: omeprazole  Walks 2 miles daily for exercise.  Gets plenty of fruits and vegetables.  Not much fried foods.  Mostly eats chicken as her protein.  Occasional sweet tea.  Patient notes she is in the process of getting her colonoscopy  scheduled.    Social History   Tobacco Use  Smoking Status Never  Smokeless Tobacco Never    Current Outpatient Medications on File Prior to Visit  Medication Sig Dispense Refill   amitriptyline (ELAVIL) 50 MG tablet TAKE 1 TABLET BY MOUTH EVERYDAY AT BEDTIME 90 tablet 1   amLODipine (NORVASC) 5 MG tablet Take 1 tablet (5 mg total) by mouth daily. 90 tablet 1   cholecalciferol (VITAMIN D) 1000 UNITS tablet Take 1,000 Units by mouth daily.     levothyroxine (SYNTHROID) 75 MCG tablet TAKE 1 TABLET (75 MCG TOTAL) BY MOUTH DAILY BEFORE BREAKFAST. 90 tablet 3   Multiple Vitamins-Minerals (CENTRUM SILVER PO) Take by mouth.     omeprazole (PRILOSEC) 20 MG capsule TAKE 1 CAPSULE BY MOUTH EVERY DAY 90 capsule 2   rosuvastatin (CRESTOR) 5 MG tablet Take 5 mg (1 tablet) by mouth on Monday, Wednesday, and Friday 36 tablet 3   No current facility-administered medications on file prior to visit.     ROS see history of present illness  Objective  Physical Exam Vitals:   03/04/22 0903  BP: 100/60  Pulse: 90  Temp: 98.3 F (36.8 C)  SpO2: 97%    BP Readings from Last  3 Encounters:  03/04/22 100/60  12/26/21 97/66  12/23/21 110/76   Wt Readings from Last 3 Encounters:  03/04/22 128 lb (58.1 kg)  12/23/21 129 lb 6 oz (58.7 kg)  12/02/21 130 lb 12.8 oz (59.3 kg)    Physical Exam Constitutional:      General: She is not in acute distress.    Appearance: She is not diaphoretic.  Cardiovascular:     Rate and Rhythm: Normal rate and regular rhythm.     Heart sounds: Normal heart sounds.  Pulmonary:     Effort: Pulmonary effort is normal.     Breath sounds: Normal breath sounds.  Skin:    General: Skin is warm and dry.  Neurological:     Mental Status: She is alert.      Assessment/Plan: Please see individual problem list.  Problem List Items Addressed This Visit     GERD (gastroesophageal reflux disease) (Chronic)    Well-controlled.  She will continue omeprazole 20 mg  daily.      Hyperlipidemia (Chronic)    Tolerating Crestor.  She will continue Crestor 5 mg Monday Wednesday and Friday.  Check labs today.  I encouraged continued diet and exercise.      Relevant Orders   Direct LDL   Hepatic function panel   Hypertension - Primary (Chronic)    Well-controlled.  Continue amlodipine 5 mg daily.  If she has more persistent swelling she will let us know.       Return in about 6 months (around 09/04/2022) for CPE.   [name], MD [place]

## 2022-03-04 NOTE — Assessment & Plan Note (Signed)
Well-controlled.  She will continue omeprazole 20 mg daily.

## 2022-03-04 NOTE — Assessment & Plan Note (Signed)
Well-controlled.  Continue amlodipine 5 mg daily.  If she has more persistent swelling she will let us know.

## 2022-03-04 NOTE — Patient Instructions (Signed)
Nice to see you. We will get lab work today and contact with results. Please continue with healthy diet and exercise. Please make sure you get your colonoscopy scheduled.

## 2022-03-06 ENCOUNTER — Telehealth: Payer: Self-pay | Admitting: Family Medicine

## 2022-03-06 NOTE — Telephone Encounter (Signed)
Copied from [place]. Topic: Medicare AWV >> Mar 06, 2022 11:24 AM Devoria Glassing wrote: Reason for CRM: Left message for patient to schedule Annual Wellness Visit.  Please schedule with Nurse Health Advisor Denisa O'Brien-Blaney, LPN at [facility]. This appt can be telephone or office visit.  Please call [phone] ask for [facility]

## 2022-04-09 ENCOUNTER — Telehealth: Payer: Self-pay | Admitting: Family Medicine

## 2022-04-09 NOTE — Telephone Encounter (Signed)
Spoke with patient she req CB 04/14/22

## 2022-04-14 ENCOUNTER — Telehealth: Payer: Self-pay | Admitting: Family Medicine

## 2022-04-14 NOTE — Telephone Encounter (Signed)
Copied from [place] [id number]. Topic: Medicare AWV >> Apr 14, 2022  1:09 PM Devoria Glassing wrote: Reason for CRM: Left message for patient to schedule Annual Wellness Visit.  Please schedule with Nurse Health Advisor Denisa O'Brien-Blaney, LPN at [facility]. This appt can be telephone or office visit.  Please call [phone] ask for [facility]

## 2022-04-15 ENCOUNTER — Ambulatory Visit (INDEPENDENT_AMBULATORY_CARE_PROVIDER_SITE_OTHER): Payer: Medicare HMO

## 2022-04-15 VITALS — Ht 63.0 in | Wt 128.0 lb

## 2022-04-15 DIAGNOSIS — Z0000 Encounter for general adult medical examination without abnormal findings: Secondary | ICD-10-CM | POA: Diagnosis not present

## 2022-04-15 NOTE — Progress Notes (Signed)
Subjective:   Erin [name] is a 71 y.o. female who presents for Medicare Annual (Subsequent) preventive examination.  Review of Systems    No ROS.  Medicare Wellness Virtual Visit.  Visual/audio telehealth visit, UTA vital signs.   See social history for additional risk factors.   Cardiac Risk Factors include: advanced age (>66mn, >>61women)     Objective:    Today's Vitals   04/15/22 0822  Weight: 128 lb (58.1 kg)  Height: '5\' 3"'$  (1.6 m)   Body mass index is 22.67 kg/m.     04/15/2022    8:21 AM 03/07/2021   11:42 AM 03/06/2020    9:24 AM 12/20/2019   10:53 AM 11/29/2019    6:58 AM 03/04/2019    9:10 AM 12/23/2016    1:55 PM  Advanced Directives  Does Patient Have a Medical Advance Directive? Yes Yes Yes Yes Yes Yes Yes  Type of AParamedicof AThomsonLiving will HMoneeLiving will HLovel[place]Living will HTyeLiving will HH[place]Living will HWoodlawnLiving will   Does patient want to make changes to medical advance directive? No - Patient declined No - Patient declined No - Patient declined  No - Patient declined No - Patient declined   Copy of [place] Chart? No - copy requested No - copy requested No - copy requested No - copy requested No - copy requested No - copy requested     Current Medications (verified) Outpatient Encounter Medications as of 04/15/2022  Medication Sig   amitriptyline (ELAVIL) 50 MG tablet TAKE 1 TABLET BY MOUTH EVERYDAY AT BEDTIME   amLODipine (NORVASC) 5 MG tablet Take 1 tablet (5 mg total) by mouth daily.   cholecalciferol (VITAMIN D) 1000 UNITS tablet Take 1,000 Units by mouth daily.   levothyroxine (SYNTHROID) 75 MCG tablet TAKE 1 TABLET (75 MCG TOTAL) BY MOUTH DAILY BEFORE BREAKFAST.   Multiple Vitamins-Minerals (CENTRUM SILVER PO) Take by mouth.   omeprazole (PRILOSEC) 20 MG capsule TAKE 1  CAPSULE BY MOUTH EVERY DAY   rosuvastatin (CRESTOR) 5 MG tablet Take 5 mg (1 tablet) by mouth on Monday, Wednesday, and Friday   No facility-administered encounter medications on file as of 04/15/2022.    Allergies (verified) Amoxicillin, Atorvastatin, and Codeine   History: Past Medical History:  Diagnosis Date   [place]   thyroid, Dr. CCarlis Abbott  GERD (gastroesophageal reflux disease)    Hyperlipidemia    Hx   Hypertension    PONV (postoperative nausea and vomiting)    Thyroid disease    Past Surgical History:  Procedure Laterality Date   CATARACT EXTRACTION W/PHACO Right 11/29/2019   Procedure: CATARACT EXTRACTION PHACO AND INTRAOCULAR LENS PLACEMENT ([place] RIGHT VIVITY TORIC LENS 8.80 00:45.2 ;  Surgeon: PBirder Robson MD;  [place]  Service: Ophthalmology;  Laterality: Right;   CATARACT EXTRACTION W/PHACO Left 12/20/2019   Procedure: CATARACT EXTRACTION PHACO AND INTRAOCULAR LENS PLACEMENT (I[facility]) LEFT VIVITY TORIC LENS 7.51  00:43.7;  Surgeon: PBirder Robson MD;  [place]  Service: Ophthalmology;  Laterality: Left;   CHOLECYSTECTOMY     COLONOSCOPY WITH PROPOFOL N/A 12/23/2016   Procedure: COLONOSCOPY WITH PROPOFOL;  Surgeon: SLollie Sails MD;  [facility]  Service: Endoscopy;  Laterality: N/A;   GALLBLADDER SURGERY  2000   KNEE SURGERY  2003/ 2010   right knee x2 Dr. HMarry Guanfor torn meniscus  [place]   right, arthritis   THYROIDECTOMY  2011   right side   Family History  Problem Relation Age of Onset   Stroke Mother    Heart disease Father    Heart attack Father    Cancer Maternal Grandmother        pancreatic   Breast cancer Neg Hx    Social History   Socioeconomic History   Marital status: Single    Spouse name: Not on file   Number of children: Not on file   Years of education: Not on file   Highest education level: Not on file  Occupational History   Not on file   Tobacco Use   Smoking status: Never   Smokeless tobacco: Never  Vaping Use   Vaping Use: Never used  Substance and Sexual Activity   Alcohol use: Yes    Alcohol/week: 0.0 standard drinks of alcohol    Comment: socially (1x/week)   Drug use: No   Sexual activity: Never    Partners: Female  Other Topics Concern   Not on file  Social History Narrative   Lives in [place].      Work - [place] Strain: Low Risk  (04/15/2022)   Overall Financial Resource Strain (CARDIA)    Difficulty of Paying Living Expenses: Not hard at all  Food Insecurity: No Food Insecurity (04/15/2022)   Hunger Vital Sign    Worried About Running Out of Food in the Last Year: Never true    Ran Out of Food in the Last Year: Never true  Transportation Needs: No Transportation Needs (04/15/2022)   PRAPARE - Hydrologist (Medical): No    Lack of Transportation (Non-Medical): No  Physical Activity: Insufficiently Active (04/15/2022)   Exercise Vital Sign    Days of Exercise per Week: 3 days    Minutes of Exercise per Session: 30 min  Stress: No Stress Concern Present (04/15/2022)   [place]    Feeling of Stress : Not at all  Social Connections: Unknown (04/15/2022)   Social Connection and Isolation Panel [NHANES]    Frequency of Communication with Friends and Family: More than three times a week    Frequency of Social Gatherings with Friends and Family: Not on file    Attends Religious Services: Not on file    Active Member of Clubs or Organizations: Not on file    Attends Archivist Meetings: Not on file    Marital Status: Not on file    Tobacco Counseling Counseling given: Not Answered   Clinical Intake:  Pre-visit preparation completed: Yes        Diabetes: No  How often do you need to have someone help you when you read instructions,  pamphlets, or other written materials from your doctor or pharmacy?: 1 - Never   Interpreter Needed?: No      Activities of Daily Living    04/15/2022    8:20 AM  In your present state of health, do you have any difficulty performing the following activities:  Hearing? 0  Vision? 0  Difficulty concentrating or making decisions? 0  Walking or climbing stairs? 0  Dressing or bathing? 0  Doing errands, shopping? 0  Preparing Food and eating ? N  Using the Toilet? N  In the past six months, have you accidently leaked urine? N  Do  you have problems with loss of bowel control? N  Managing your Medications? N  Managing your Finances? N  Housekeeping or managing your Housekeeping? N    Patient Care Team: Leone Haven, MD as PCP - General (Family Medicine)  Indicate any recent Medical Services you may have received from other than [facility] providers in the past year (date may be approximate).     Assessment:   This is a routine wellness examination for Erin [name].  Virtual Visit via Telephone Note  I connected with  Erin [name] on 04/15/22 at  8:15 AM EDT by telephone and verified that I am speaking with the correct person using two identifiers.  Location: Patient: home Provider: office Persons participating in the virtual visit: patient/Nurse Health Advisor   I discussed the limitations of performing an evaluation and management service by telehealth. We continued and completed visit with audio only. Some vital signs may be absent or patient reported.     Hearing/Vision screen Hearing Screening - Comments:: Patient is able to hear conversational tones without difficulty. No issues reported. Vision Screening - Comments:: Followed by Dr. George Ina Cataract extraction, bilateral  They have seen their ophthalmologist  Dietary issues and exercise activities discussed: Current Exercise Habits: Home exercise routine, Time (Minutes): 30, Frequency (Times/Week): 3, Weekly Exercise  (Minutes/Week): 90, Intensity: Mild Healthy diet  Good water intake   Goals Addressed               This Visit's Progress     Patient Stated     Follow up with Primary Care Provider (pt-stated)        Follow up as needed. Maintain good health.        Depression Screen    04/15/2022    8:25 AM 03/04/2022    9:04 AM 11/19/2021   10:00 AM 06/03/2021    8:12 AM 03/07/2021    9:15 AM 11/29/2020    8:17 AM 07/17/2020    8:10 AM  PHQ 2/9 Scores  PHQ - 2 Score 0 0 0 0 0 0 0  PHQ- 9 Score   0        Fall Risk    04/15/2022    8:24 AM 03/04/2022    9:04 AM 11/19/2021   10:00 AM 03/07/2021   11:43 AM 11/29/2020    8:16 AM  Fall Risk   Falls in the past year? 0 0 0 0 0  Number falls in past yr: 0 0 0 0 0  Injury with Fall? 0 0 0    Risk for fall due to :  No Fall Risks No Fall Risks    Follow up Falls evaluation completed Falls evaluation completed Falls evaluation completed Falls evaluation completed Falls evaluation completed    [place]: Home free of loose throw rugs in walkways, pet beds, electrical cords, etc? Yes  Adequate lighting in your home to reduce risk of falls? Yes   ASSISTIVE DEVICES UTILIZED TO PREVENT FALLS: Life alert? No  Use of a cane, walker or w/c? No   TIMED UP AND GO: Was the test performed? No .   Cognitive Function:    03/06/2020    9:27 AM  MMSE - Mini Mental State Exam  Not completed: Unable to complete        04/15/2022    8:25 AM 03/04/2019    9:13 AM  6CIT Screen  What Year? 0 points 0 points  What month? 0 points 0  points  What time? 0 points 0 points  Count back from 20 0 points 0 points  Months in reverse 0 points 0 points  Repeat phrase 0 points 0 points  Total Score 0 points 0 points    Immunizations Immunization History  Administered Date(s) Administered   Fluad Quad(high Dose 65+) 05/12/2019, 06/25/2020, 06/18/2021   Influenza, High Dose Seasonal PF 05/28/2017, 06/07/2018    Influenza-Unspecified 04/20/2013, 05/28/2015   PFIZER(Purple Top)SARS-COV-2 Vaccination 03/05/2020, 03/26/2020   Td 01/16/2020   Tdap 12/04/2005   Shingles vaccine- discontinued per patient.  Screening Tests Health Maintenance  Topic Date Due   COLONOSCOPY (Pts 45-27yr Insurance coverage will need to be confirmed)  06/04/2022 (Originally 12/23/2021)   INFLUENZA VACCINE  11/02/2022 (Originally 03/04/2022)   Pneumonia Vaccine 70 Years old (1 - PCV) 12/03/2022 (Originally 04/06/2017)   MAMMOGRAM  07/10/2022   TETANUS/TDAP  01/15/2030   DEXA SCAN  Completed   HPV VACCINES  Aged Out   COVID-19 Vaccine  Discontinued   Hepatitis C Screening  Discontinued   Zoster Vaccines- Shingrix  Discontinued   Health Maintenance There are no preventive care reminders to display for this patient.  Colonoscopy- consult scheduled 06/2022.  Lung Cancer Screening: (Low Dose CT Chest recommended if Age 70-80years, 30 pack-year currently smoking OR have quit w/in 15years.) does not qualify.   Hepatitis C Screening: discontinued per patient.   Vision Screening: Recommended annual ophthalmology exams for early detection of glaucoma and other disorders of the eye.  Dental Screening: Recommended annual dental exams for proper oral hygiene  Community Resource Referral / Chronic Care Management: CRR required this visit?  No   CCM required this visit?  No      Plan:     I have personally reviewed and noted the following in the patient's chart:   Medical and social history Use of alcohol, tobacco or illicit drugs  Current medications and supplements including opioid prescriptions. Patient is not currently taking opioid prescriptions. Functional ability and status Nutritional status Physical activity Advanced directives List of other physicians Hospitalizations, surgeries, and ER visits in previous 12 months Vitals Screenings to include cognitive, depression, and falls Referrals and  appointments  In addition, I have reviewed and discussed with patient certain preventive protocols, quality metrics, and best practice recommendations. A written personalized care plan for preventive services as well as general preventive health recommendations were provided to patient.     OVarney Biles LPN   [id number]

## 2022-04-15 NOTE — Patient Instructions (Addendum)
Erin [name] , Thank you for taking time to come for your Medicare Wellness Visit. I appreciate your ongoing commitment to your health goals. Please review the following plan we discussed and let me know if I can assist you in the future.   These are the goals we discussed:  Goals       Patient Stated     Follow up with Primary Care Provider (pt-stated)      Follow up as needed. Maintain good health.         This is a list of the screening recommended for you and due dates:  Health Maintenance  Topic Date Due   Colon Cancer Screening  06/04/2022*   Flu Shot  11/02/2022*   Pneumonia Vaccine (1 - PCV) 12/03/2022*   Mammogram  07/10/2022   Tetanus Vaccine  01/15/2030   DEXA scan (bone density measurement)  Completed   HPV Vaccine  Aged Out   COVID-19 Vaccine  Discontinued   Hepatitis C Screening: USPSTF Recommendation to screen - Ages 13-79 yo.  Discontinued   Zoster (Shingles) Vaccine  Discontinued  *Topic was postponed. The date shown is not the original due date.    End of life planning; Advance aging; Advanced directives discussed.  Copy of current HCPOA/Living Will requested.    Next appointment: Follow up in one year for your annual wellness visit    Preventive Care 65 Years and Older, Female Preventive care refers to lifestyle choices and visits with your health care provider that can promote health and wellness. What does preventive care include? A yearly physical exam. This is also called an annual well check. Dental exams once or twice a year. Routine eye exams. Ask your health care provider how often you should have your eyes checked. Personal lifestyle choices, including: Daily care of your teeth and gums. Regular physical activity. Eating a healthy diet. Avoiding tobacco and drug use. Limiting alcohol use. Practicing safe sex. Taking low-dose aspirin every day. Taking vitamin and mineral supplements as recommended by your health care provider. What happens  during an annual well check? The services and screenings done by your health care provider during your annual well check will depend on your age, overall health, lifestyle risk factors, and family history of disease. Counseling  Your health care provider may ask you questions about your: Alcohol use. Tobacco use. Drug use. Emotional well-being. Home and relationship well-being. Sexual activity. Eating habits. History of falls. Memory and ability to understand (cognition). Work and work Statistician. Reproductive health. Screening  You may have the following tests or measurements: Height, weight, and BMI. Blood pressure. Lipid and cholesterol levels. These may be checked every 5 years, or more frequently if you are over 70 years old. Skin check. Lung cancer screening. You may have this screening every year starting at age 70 if you have a 30-pack-year history of smoking and currently smoke or have quit within the past 15 years. Fecal occult blood test (FOBT) of the stool. You may have this test every year starting at age 70. Flexible sigmoidoscopy or colonoscopy. You may have a sigmoidoscopy every 5 years or a colonoscopy every 10 years starting at age 70. Hepatitis C blood test. Hepatitis B blood test. Sexually transmitted disease (STD) testing. Diabetes screening. This is done by checking your blood sugar (glucose) after you have not eaten for a while (fasting). You may have this done every 1-3 years. Bone density scan. This is done to screen for osteoporosis. You may have this done  starting at age 70. Mammogram. This may be done every 1-2 years. Talk to your health care provider about how often you should have regular mammograms. Talk with your health care provider about your test results, treatment options, and if necessary, the need for more tests. Vaccines  Your health care provider may recommend certain vaccines, such as: Influenza vaccine. This is recommended every  year. Tetanus, diphtheria, and acellular pertussis (Tdap, Td) vaccine. You may need a Td booster every 10 years. Zoster vaccine. You may need this after age 70. Pneumococcal 13-valent conjugate (PCV13) vaccine. One dose is recommended after age 70. Pneumococcal polysaccharide (PPSV23) vaccine. One dose is recommended after age 70. Talk to your health care provider about which screenings and vaccines you need and how often you need them. This information is not intended to replace advice given to you by your health care provider. Make sure you discuss any questions you have with your health care provider. Document Released: 08/17/2015 Document Revised: 04/09/2016 Document Reviewed: 05/22/2015 Elsevier Interactive Patient Education  2017 [place] Prevention in the Home Falls can cause injuries. They can happen to people of all ages. There are many things you can do to make your home safe and to help prevent falls. What can I do on the outside of my home? Regularly fix the edges of walkways and driveways and fix any cracks. Remove anything that might make you trip as you walk through a door, such as a raised step or threshold. Trim any bushes or trees on the path to your home. Use bright outdoor lighting. Clear any walking paths of anything that might make someone trip, such as rocks or tools. Regularly check to see if handrails are loose or broken. Make sure that both sides of any steps have handrails. Any raised decks and porches should have guardrails on the edges. Have any leaves, snow, or ice cleared regularly. Use sand or salt on walking paths during winter. Clean up any spills in your garage right away. This includes oil or grease spills. What can I do in the bathroom? Use night lights. Install grab bars by the toilet and in the tub and shower. Do not use towel bars as grab bars. Use non-skid mats or decals in the tub or shower. If you need to sit down in the shower, use a  plastic, non-slip stool. Keep the floor dry. Clean up any water that spills on the floor as soon as it happens. Remove soap buildup in the tub or shower regularly. Attach bath mats securely with double-sided non-slip rug tape. Do not have throw rugs and other things on the floor that can make you trip. What can I do in the bedroom? Use night lights. Make sure that you have a light by your bed that is easy to reach. Do not use any sheets or blankets that are too big for your bed. They should not hang down onto the floor. Have a firm chair that has side arms. You can use this for support while you get dressed. Do not have throw rugs and other things on the floor that can make you trip. What can I do in the kitchen? Clean up any spills right away. Avoid walking on wet floors. Keep items that you use a lot in easy-to-reach places. If you need to reach something above you, use a strong step stool that has a grab bar. Keep electrical cords out of the way. Do not use floor polish or wax that  makes floors slippery. If you must use wax, use non-skid floor wax. Do not have throw rugs and other things on the floor that can make you trip. What can I do with my stairs? Do not leave any items on the stairs. Make sure that there are handrails on both sides of the stairs and use them. Fix handrails that are broken or loose. Make sure that handrails are as long as the stairways. Check any carpeting to make sure that it is firmly attached to the stairs. Fix any carpet that is loose or worn. Avoid having throw rugs at the top or bottom of the stairs. If you do have throw rugs, attach them to the floor with carpet tape. Make sure that you have a light switch at the top of the stairs and the bottom of the stairs. If you do not have them, ask someone to add them for you. What else can I do to help prevent falls? Wear shoes that: Do not have high heels. Have rubber bottoms. Are comfortable and fit you  well. Are closed at the toe. Do not wear sandals. If you use a stepladder: Make sure that it is fully opened. Do not climb a closed stepladder. Make sure that both sides of the stepladder are locked into place. Ask someone to hold it for you, if possible. Clearly mark and make sure that you can see: Any grab bars or handrails. First and last steps. Where the edge of each step is. Use tools that help you move around (mobility aids) if they are needed. These include: Canes. Walkers. Scooters. Crutches. Turn on the lights when you go into a dark area. Replace any light bulbs as soon as they burn out. Set up your furniture so you have a clear path. Avoid moving your furniture around. If any of your floors are uneven, fix them. If there are any pets around you, be aware of where they are. Review your medicines with your doctor. Some medicines can make you feel dizzy. This can increase your chance of falling. Ask your doctor what other things that you can do to help prevent falls. This information is not intended to replace advice given to you by your health care provider. Make sure you discuss any questions you have with your health care provider. Document Released: 05/17/2009 Document Revised: 12/27/2015 Document Reviewed: 08/25/2014 Elsevier Interactive Patient Education  2017 [employer].

## 2022-05-19 ENCOUNTER — Other Ambulatory Visit: Payer: Self-pay | Admitting: Family Medicine

## 2022-05-27 ENCOUNTER — Ambulatory Visit: Payer: Medicare HMO

## 2022-05-27 ENCOUNTER — Ambulatory Visit (INDEPENDENT_AMBULATORY_CARE_PROVIDER_SITE_OTHER): Payer: Medicare HMO

## 2022-05-27 DIAGNOSIS — Z23 Encounter for immunization: Secondary | ICD-10-CM | POA: Diagnosis not present

## 2022-06-03 ENCOUNTER — Other Ambulatory Visit: Payer: Self-pay | Admitting: Family Medicine

## 2022-06-03 DIAGNOSIS — Z1231 Encounter for screening mammogram for malignant neoplasm of breast: Secondary | ICD-10-CM

## 2022-06-11 DIAGNOSIS — K582 Mixed irritable bowel syndrome: Secondary | ICD-10-CM | POA: Diagnosis not present

## 2022-06-11 DIAGNOSIS — Z86010 Personal history of colonic polyps: Secondary | ICD-10-CM | POA: Diagnosis not present

## 2022-06-20 DIAGNOSIS — E559 Vitamin D deficiency, unspecified: Secondary | ICD-10-CM | POA: Diagnosis not present

## 2022-07-07 ENCOUNTER — Ambulatory Visit: Admit: 2022-07-07 | Payer: Medicare HMO | Admitting: Internal Medicine

## 2022-07-07 DIAGNOSIS — Z1211 Encounter for screening for malignant neoplasm of colon: Secondary | ICD-10-CM | POA: Diagnosis not present

## 2022-07-07 DIAGNOSIS — K5730 Diverticulosis of large intestine without perforation or abscess without bleeding: Secondary | ICD-10-CM | POA: Diagnosis not present

## 2022-07-07 DIAGNOSIS — Z86010 Personal history of colonic polyps: Secondary | ICD-10-CM | POA: Diagnosis not present

## 2022-07-07 SURGERY — COLONOSCOPY
Anesthesia: General

## 2022-07-13 ENCOUNTER — Other Ambulatory Visit: Payer: Self-pay | Admitting: Family Medicine

## 2022-07-15 ENCOUNTER — Ambulatory Visit
Admission: RE | Admit: 2022-07-15 | Discharge: 2022-07-15 | Disposition: A | Discharge status: Home / Self Care | Payer: Medicare HMO | Source: Ambulatory Visit | Attending: Family Medicine | Admitting: Family Medicine

## 2022-07-15 DIAGNOSIS — Z1231 Encounter for screening mammogram for malignant neoplasm of breast: Secondary | ICD-10-CM | POA: Insufficient documentation

## 2022-07-16 ENCOUNTER — Encounter: Payer: Self-pay | Admitting: Family Medicine

## 2022-07-26 ENCOUNTER — Other Ambulatory Visit: Payer: Self-pay | Admitting: Family Medicine

## 2022-08-27 DIAGNOSIS — H43813 Vitreous degeneration, bilateral: Secondary | ICD-10-CM | POA: Diagnosis not present

## 2022-08-27 DIAGNOSIS — M3501 Sicca syndrome with keratoconjunctivitis: Secondary | ICD-10-CM | POA: Diagnosis not present

## 2022-08-27 DIAGNOSIS — G43109 Migraine with aura, not intractable, without status migrainosus: Secondary | ICD-10-CM | POA: Diagnosis not present

## 2022-08-27 DIAGNOSIS — Z961 Presence of intraocular lens: Secondary | ICD-10-CM | POA: Diagnosis not present

## 2022-09-02 DIAGNOSIS — K229 Disease of esophagus, unspecified: Secondary | ICD-10-CM | POA: Diagnosis not present

## 2022-09-02 DIAGNOSIS — C73 Malignant neoplasm of thyroid gland: Secondary | ICD-10-CM | POA: Diagnosis not present

## 2022-09-02 DIAGNOSIS — E063 Autoimmune thyroiditis: Secondary | ICD-10-CM | POA: Diagnosis not present

## 2022-09-02 DIAGNOSIS — E890 Postprocedural hypothyroidism: Secondary | ICD-10-CM | POA: Diagnosis not present

## 2022-09-02 DIAGNOSIS — E785 Hyperlipidemia, unspecified: Secondary | ICD-10-CM | POA: Diagnosis not present

## 2022-09-02 LAB — TSH: TSH: 1.32 (ref 0.41–5.90)

## 2022-09-08 ENCOUNTER — Encounter: Payer: Self-pay | Admitting: Family Medicine

## 2022-09-08 ENCOUNTER — Ambulatory Visit (INDEPENDENT_AMBULATORY_CARE_PROVIDER_SITE_OTHER): Payer: Medicare HMO | Admitting: Family Medicine

## 2022-09-08 VITALS — BP 110/70 | HR 70 | Temp 98.0°F | Ht 63.0 in | Wt 126.4 lb

## 2022-09-08 DIAGNOSIS — Z0000 Encounter for general adult medical examination without abnormal findings: Secondary | ICD-10-CM

## 2022-09-08 DIAGNOSIS — I10 Essential (primary) hypertension: Secondary | ICD-10-CM

## 2022-09-08 DIAGNOSIS — E039 Hypothyroidism, unspecified: Secondary | ICD-10-CM | POA: Diagnosis not present

## 2022-09-08 MED ORDER — AMLODIPINE BESYLATE 5 MG PO TABS: 5.0000 mg | ORAL_TABLET | Freq: Every day | ORAL | 1 refills | Status: DC

## 2022-09-08 MED ORDER — LEVOTHYROXINE SODIUM 75 MCG PO TABS: ORAL_TABLET | ORAL | 3 refills | Status: AC

## 2022-09-08 NOTE — Progress Notes (Signed)
[name], MD Phone: [phone]  Erin [name] is a 72 y.o. female who presents today for CPE.  Diet: more chicken and vegetables, rare red meat, occasional sweet tea, not much junk Exercise: walks and does bands and light weights  Pap smear: aged out, reports no prior abnormals and was getting them consistently Colonoscopy: had in December with [facility] Mammogram: 07/15/22 negative Family history-  Pancreatic cancer: grandmother  Colon cancer: no  Breast cancer: no  Ovarian cancer: no Menses: postmenopausal Vaccines-   Flu: UTD  Tetanus: UTD  Shingles: declines  COVID19: x2  Pneumonia: declines  RSV: declines Hep C Screening: UTD Tobacco use: no Alcohol use: socially, 2 beers at most Illicit Drug use: no Dentist: yes Ophthalmology: yes Notes occasional joint aches that comes and goes. Wonders if this is related to her statin.  Notes chronic tinnitus.   Active Ambulatory Problems    Diagnosis Date Noted   GERD (gastroesophageal reflux disease) 04/19/2012   Hyperlipidemia 04/19/2012   Hypothyroidism 04/19/2012   Routine general medical examination at a health care facility 10/20/2012   Osteopenia 06/08/2013   Arthralgia 08/11/2013   Right hip pain 10/24/2013   Adjustment disorder with mixed anxiety and depressed mood 11/26/2015   Neck pain on right side 07/09/2016   Colon polyps 05/28/2017   Chest pain with moderate risk for cardiac etiology 11/03/2017   Thyroid nodule 01/05/2019   Hypertension 07/06/2019   Palpitations 07/06/2019   Basal cell carcinoma 10/10/2019   Loose stools 01/28/2021   Chest tightness 11/19/2021   Elevated hemoglobin (HCC) 12/02/2021   Resolved Ambulatory Problems    Diagnosis Date Noted   Shingles rash 04/27/2012   Cervical cancer screening 04/25/2013   Viral URI with cough 04/25/2013   Shoulder bursitis 11/06/2014   Cough 11/19/2018   Bronchitis 06/03/2021   Past Medical History:  Diagnosis Date   Cancer ([place]) 2011    PONV (postoperative nausea and vomiting)    Thyroid disease     Family History  Problem Relation Age of Onset   Stroke Mother    Heart disease Father    Heart attack Father    Cancer Maternal Grandmother        pancreatic   Breast cancer Neg Hx     Social History   Socioeconomic History   Marital status: Single    Spouse name: Not on file   Number of children: Not on file   Years of education: Not on file   Highest education level: Not on file  Occupational History   Not on file  Tobacco Use   Smoking status: Never   Smokeless tobacco: Never  Vaping Use   Vaping Use: Never used  Substance and Sexual Activity   Alcohol use: Yes    Alcohol/week: 0.0 standard drinks of alcohol    Comment: socially (1x/week)   Drug use: No   Sexual activity: Never    Partners: Female  Other Topics Concern   Not on file  Social History Narrative   Lives in [place].      Work - [place] Strain: Low Risk  (04/15/2022)   Overall Financial Resource Strain (CARDIA)    Difficulty of Paying Living Expenses: Not hard at all  Food Insecurity: No Food Insecurity (04/15/2022)   Hunger Vital Sign    Worried About Running Out of Food in the Last Year: Never true    [place] in the Last Year:  Never true  Transportation Needs: No Transportation Needs (04/15/2022)   PRAPARE - Hydrologist (Medical): No    Lack of Transportation (Non-Medical): No  Physical Activity: Insufficiently Active (04/15/2022)   Exercise Vital Sign    Days of Exercise per Week: 3 days    Minutes of Exercise per Session: 30 min  Stress: No Stress Concern Present (04/15/2022)   [place]    Feeling of Stress : Not at all  Social Connections: Unknown (04/15/2022)   Social Connection and Isolation Panel [NHANES]    Frequency of Communication with Friends and Family: More than  three times a week    Frequency of Social Gatherings with Friends and Family: Not on file    Attends Religious Services: Not on file    Active Member of [place] or Organizations: Not on file    Attends Archivist Meetings: Not on file    Marital Status: Not on file  Intimate Partner Violence: Not At Risk (04/15/2022)   Humiliation, Afraid, Rape, and Kick questionnaire    Fear of Current or Ex-Partner: No    Emotionally Abused: No    Physically Abused: No    Sexually Abused: No    ROS  General:  Negative for nexplained weight loss, fever Skin: Negative for new or changing mole, sore that won't heal HEENT: Positive for tinnitus, Negative for trouble hearing, trouble seeing, mouth sores, hoarseness, change in voice, dysphagia. CV:  Negative for chest pain, dyspnea, edema, palpitations Resp: Negative for cough, dyspnea, hemoptysis GI: Negative for nausea, vomiting, diarrhea, constipation, abdominal pain, melena, hematochezia. GU: Negative for dysuria, incontinence, urinary hesitance, hematuria, vaginal or penile discharge, polyuria, sexual difficulty, lumps in testicle or breasts MSK: Positive for joint pain, Negative for muscle cramps or aches, joint swelling Neuro: Negative for headaches, weakness, numbness, dizziness, passing out/fainting Psych: Negative for depression, anxiety, memory problems  Objective  Physical Exam Vitals:   09/08/22 0857  BP: 110/70  Pulse: 70  Temp: 98 F (36.7 C)  SpO2: 98%    BP Readings from Last 3 Encounters:  09/08/22 110/70  03/04/22 100/60  12/26/21 97/66   Wt Readings from Last 3 Encounters:  09/08/22 126 lb 6.4 oz (57.3 kg)  04/15/22 128 lb (58.1 kg)  03/04/22 128 lb (58.1 kg)    Physical Exam Constitutional:      General: She is not in acute distress.    Appearance: She is not diaphoretic.  HENT:     Head: Normocephalic and atraumatic.  Cardiovascular:     Rate and Rhythm: Normal rate and regular rhythm.     Heart  sounds: Normal heart sounds.  Pulmonary:     Effort: Pulmonary effort is normal.     Breath sounds: Normal breath sounds.  Abdominal:     General: Bowel sounds are normal. There is no distension.     Palpations: Abdomen is soft.     Tenderness: There is no abdominal tenderness.  Musculoskeletal:     Right lower leg: No edema.     Left lower leg: No edema.  Lymphadenopathy:     Cervical: No cervical adenopathy.  Skin:    General: Skin is warm and dry.  Neurological:     Mental Status: She is alert.  Psychiatric:        Mood and Affect: Mood normal.      Assessment/Plan:   Routine general medical examination at a health care facility Assessment &  Plan: Physical exam completed. Discussed continued healthy diet and exercise. We will request her colonoscopy results. DEXA scan up to date. She declines further covid vaccines, shingrix vaccine, pneumonia vaccine, and RSV vaccination. She understands the purpose of these vaccines. She reports she had labs recently with her endocrinologist. She will bring [place] a copy of those and if she needs further labs we will order them at that time.    Hypothyroidism, unspecified type -     Levothyroxine Sodium; TAKE 1 TABLET (75 MCG TOTAL) BY MOUTH DAILY BEFORE BREAKFAST.  Dispense: 90 tablet; Refill: 3  Primary hypertension -     amLODIPine Besylate; Take 1 tablet (5 mg total) by mouth daily.  Dispense: 90 tablet; Refill: 1    Return in about 6 months (around 03/09/2023) for HTN.   [name], MD [place]

## 2022-09-08 NOTE — Assessment & Plan Note (Signed)
Physical exam completed. Discussed continued healthy diet and exercise. We will request her colonoscopy results. DEXA scan up to date. She declines further covid vaccines, shingrix vaccine, pneumonia vaccine, and RSV vaccination. She understands the purpose of these vaccines. She reports she had labs recently with her endocrinologist. She will bring [place] a copy of those and if she needs further labs we will order them at that time.

## 2022-09-09 ENCOUNTER — Encounter: Payer: Self-pay | Admitting: Family Medicine

## 2022-09-09 ENCOUNTER — Telehealth: Payer: Self-pay | Admitting: Family Medicine

## 2022-09-09 DIAGNOSIS — E785 Hyperlipidemia, unspecified: Secondary | ICD-10-CM | POA: Diagnosis not present

## 2022-09-09 DIAGNOSIS — C73 Malignant neoplasm of thyroid gland: Secondary | ICD-10-CM | POA: Diagnosis not present

## 2022-09-09 DIAGNOSIS — E063 Autoimmune thyroiditis: Secondary | ICD-10-CM | POA: Diagnosis not present

## 2022-09-09 DIAGNOSIS — E782 Mixed hyperlipidemia: Secondary | ICD-10-CM

## 2022-09-09 DIAGNOSIS — K229 Disease of esophagus, unspecified: Secondary | ICD-10-CM | POA: Diagnosis not present

## 2022-09-09 NOTE — Telephone Encounter (Signed)
I called the patient and she is scheduled for labs. Nahomi Hegner,cma

## 2022-09-09 NOTE — Telephone Encounter (Signed)
Ordered.  She can have them at her convenience.

## 2022-09-09 NOTE — Telephone Encounter (Signed)
Pt called in staying that she only had her thyroid labs done, and if Dr.Sonnenberg can put an order in for the rest of labs like cholesterol. She would like to be notified when this its done.

## 2022-09-10 DIAGNOSIS — Z0100 Encounter for examination of eyes and vision without abnormal findings: Secondary | ICD-10-CM | POA: Diagnosis not present

## 2022-09-11 ENCOUNTER — Telehealth: Payer: Self-pay | Admitting: Family Medicine

## 2022-09-11 ENCOUNTER — Other Ambulatory Visit (INDEPENDENT_AMBULATORY_CARE_PROVIDER_SITE_OTHER): Payer: Medicare HMO

## 2022-09-11 DIAGNOSIS — E782 Mixed hyperlipidemia: Secondary | ICD-10-CM | POA: Diagnosis not present

## 2022-09-11 LAB — LIPID PANEL
Cholesterol: 153 mg/dL (ref 0–200)
HDL: 43.1 mg/dL (ref >39.00)
LDL Cholesterol: 91 mg/dL (ref 0–99)
NonHDL: 109.55
Total CHOL/HDL Ratio: 4
Triglycerides: 95 mg/dL (ref 0.0–149.0)
VLDL: 19 mg/dL (ref 0.0–40.0)

## 2022-09-11 LAB — COMPREHENSIVE METABOLIC PANEL
ALT: 21 U/L (ref 0–35)
AST: 21 U/L (ref 0–37)
Albumin: 4.4 g/dL (ref 3.5–5.2)
Alkaline Phosphatase: 69 U/L (ref 39–117)
BUN: 18 mg/dL (ref 6–23)
CO2: 29 mEq/L (ref 19–32)
Calcium: 9.5 mg/dL (ref 8.4–10.5)
Chloride: 103 mEq/L (ref 96–112)
Creatinine, Ser: 0.76 mg/dL (ref 0.40–1.20)
GFR: 79.39 mL/min (ref >60.00)
Glucose, Bld: 94 mg/dL (ref 70–99)
Potassium: 4.4 mEq/L (ref 3.5–5.1)
Sodium: 142 mEq/L (ref 135–145)
Total Bilirubin: 0.5 mg/dL (ref 0.2–1.2)
Total Protein: 6.7 g/dL (ref 6.0–8.3)

## 2022-09-11 NOTE — Telephone Encounter (Signed)
Lab work was abstracted and put in the lab basket for provider to review.  Erin [name],cma

## 2022-09-11 NOTE — Telephone Encounter (Signed)
Will review when I am back in the office.

## 2022-09-11 NOTE — Telephone Encounter (Signed)
Pt came into the office to drop off her thyroid report. Placed in provider folder up front

## 2022-09-12 NOTE — Telephone Encounter (Signed)
error 

## 2022-11-10 DIAGNOSIS — D2261 Melanocytic nevi of right upper limb, including shoulder: Secondary | ICD-10-CM | POA: Diagnosis not present

## 2022-11-10 DIAGNOSIS — Z85828 Personal history of other malignant neoplasm of skin: Secondary | ICD-10-CM | POA: Diagnosis not present

## 2022-11-10 DIAGNOSIS — X32XXXA Exposure to sunlight, initial encounter: Secondary | ICD-10-CM | POA: Diagnosis not present

## 2022-11-10 DIAGNOSIS — D2262 Melanocytic nevi of left upper limb, including shoulder: Secondary | ICD-10-CM | POA: Diagnosis not present

## 2022-11-10 DIAGNOSIS — C44619 Basal cell carcinoma of skin of left upper limb, including shoulder: Secondary | ICD-10-CM | POA: Diagnosis not present

## 2022-11-10 DIAGNOSIS — D225 Melanocytic nevi of trunk: Secondary | ICD-10-CM | POA: Diagnosis not present

## 2022-11-10 DIAGNOSIS — D2272 Melanocytic nevi of left lower limb, including hip: Secondary | ICD-10-CM | POA: Diagnosis not present

## 2022-11-10 DIAGNOSIS — L570 Actinic keratosis: Secondary | ICD-10-CM | POA: Diagnosis not present

## 2022-11-10 DIAGNOSIS — D485 Neoplasm of uncertain behavior of skin: Secondary | ICD-10-CM | POA: Diagnosis not present

## 2022-11-14 ENCOUNTER — Other Ambulatory Visit: Payer: Self-pay | Admitting: Family Medicine

## 2022-11-14 DIAGNOSIS — E782 Mixed hyperlipidemia: Secondary | ICD-10-CM

## 2023-01-08 DIAGNOSIS — C44619 Basal cell carcinoma of skin of left upper limb, including shoulder: Secondary | ICD-10-CM | POA: Diagnosis not present

## 2023-01-28 ENCOUNTER — Other Ambulatory Visit: Payer: Self-pay | Admitting: Family

## 2023-01-28 DIAGNOSIS — M1611 Unilateral primary osteoarthritis, right hip: Secondary | ICD-10-CM | POA: Diagnosis not present

## 2023-01-28 DIAGNOSIS — M87851 Other osteonecrosis, right femur: Secondary | ICD-10-CM | POA: Diagnosis not present

## 2023-01-28 DIAGNOSIS — M1711 Unilateral primary osteoarthritis, right knee: Secondary | ICD-10-CM | POA: Diagnosis not present

## 2023-01-29 ENCOUNTER — Other Ambulatory Visit: Payer: Self-pay | Admitting: Orthopedic Surgery

## 2023-01-29 DIAGNOSIS — M87851 Other osteonecrosis, right femur: Secondary | ICD-10-CM

## 2023-01-29 DIAGNOSIS — M1711 Unilateral primary osteoarthritis, right knee: Secondary | ICD-10-CM

## 2023-01-30 ENCOUNTER — Ambulatory Visit
Admission: RE | Admit: 2023-01-30 | Discharge: 2023-01-30 | Disposition: A | Discharge status: Home / Self Care | Payer: Medicare HMO | Source: Ambulatory Visit | Attending: Orthopedic Surgery | Admitting: Orthopedic Surgery

## 2023-01-30 DIAGNOSIS — M87851 Other osteonecrosis, right femur: Secondary | ICD-10-CM | POA: Insufficient documentation

## 2023-01-30 DIAGNOSIS — M1611 Unilateral primary osteoarthritis, right hip: Secondary | ICD-10-CM | POA: Diagnosis not present

## 2023-01-30 DIAGNOSIS — M25551 Pain in right hip: Secondary | ICD-10-CM | POA: Diagnosis not present

## 2023-02-18 ENCOUNTER — Other Ambulatory Visit: Payer: Medicare HMO

## 2023-02-25 ENCOUNTER — Encounter: Payer: Self-pay | Admitting: Family Medicine

## 2023-02-25 MED ORDER — AMITRIPTYLINE HCL 50 MG PO TABS: ORAL_TABLET | ORAL | 1 refills | Status: DC

## 2023-03-09 ENCOUNTER — Ambulatory Visit (INDEPENDENT_AMBULATORY_CARE_PROVIDER_SITE_OTHER): Payer: Medicare HMO | Admitting: Family Medicine

## 2023-03-09 ENCOUNTER — Encounter: Payer: Self-pay | Admitting: Family Medicine

## 2023-03-09 VITALS — BP 126/78 | HR 92 | Temp 98.6°F | Ht 63.0 in | Wt 127.2 lb

## 2023-03-09 DIAGNOSIS — I10 Essential (primary) hypertension: Secondary | ICD-10-CM

## 2023-03-09 DIAGNOSIS — G8929 Other chronic pain: Secondary | ICD-10-CM

## 2023-03-09 DIAGNOSIS — E782 Mixed hyperlipidemia: Secondary | ICD-10-CM | POA: Diagnosis not present

## 2023-03-09 DIAGNOSIS — M25561 Pain in right knee: Secondary | ICD-10-CM

## 2023-03-09 DIAGNOSIS — E039 Hypothyroidism, unspecified: Secondary | ICD-10-CM | POA: Diagnosis not present

## 2023-03-09 LAB — TSH: TSH: 1.1 u[IU]/mL (ref 0.35–5.50)

## 2023-03-09 NOTE — Progress Notes (Unsigned)
Marikay Alar, MD Phone: [phone]  Erin [name] is a 72 y.o. female who presents today for follow-up.  Hypertension: Patient reports blood pressures between 128-139/87-98.  She ends up checking her blood pressure when she starts to feel little funny in her chest and her blood pressure is in these ranges.  She does not check her blood pressure every day.  She notes no chest pain or shortness of breath.  She notes some edema when standing for a long period of time.  Hypothyroidism: Taking Synthroid.  No skin changes.  No heat or cold intolerance.  She does have history of thyroid cancer and notes her endocrinologist is retiring.  Hyperlipidemia: Taking Crestor 5 mg on Monday, Wednesday, and Friday.  No right upper quadrant pain.  No change to chronic muscle and joint issues.  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  She got a steroid shot.  She wants to hold off on surgery at this time.  She notes they also did an MRI of her right hip which revealed arthritis and a torn labrum.  Social History   Tobacco Use  Smoking Status Never  Smokeless Tobacco Never    Current Outpatient Medications on File Prior to Visit  Medication Sig Dispense Refill   amitriptyline (ELAVIL) 50 MG tablet TAKE 1 TABLET BY MOUTH EVERYDAY AT BEDTIME 90 tablet 1   amLODipine (NORVASC) 5 MG tablet Take 1 tablet (5 mg total) by mouth daily. 90 tablet 1   cholecalciferol (VITAMIN D) 1000 UNITS tablet Take 1,000 Units by mouth daily.     levothyroxine (SYNTHROID) 75 MCG tablet TAKE 1 TABLET (75 MCG TOTAL) BY MOUTH DAILY BEFORE BREAKFAST. 90 tablet 3   Multiple Vitamins-Minerals (CENTRUM SILVER PO) Take by mouth.     omeprazole (PRILOSEC) 20 MG capsule TAKE 1 CAPSULE BY MOUTH EVERY DAY 90 capsule 2   rosuvastatin (CRESTOR) 5 MG tablet TAKE 5 MG (1 TABLET) BY MOUTH ON MONDAY, WEDNESDAY, AND FRIDAY 36 tablet 3   No current facility-administered  medications on file prior to visit.     ROS see history of present illness  Objective  Physical Exam Vitals:   03/09/23 1328  BP: 126/78  Pulse: 92  Temp: 98.6 F (37 C)  SpO2: 97%    BP Readings from Last 3 Encounters:  03/09/23 126/78  09/08/22 110/70  03/04/22 100/60   Wt Readings from Last 3 Encounters:  03/09/23 127 lb 3.2 oz (57.7 kg)  09/08/22 126 lb 6.4 oz (57.3 kg)  04/15/22 128 lb (58.1 kg)    Physical Exam Constitutional:      General: She is not in acute distress.    Appearance: She is not diaphoretic.  Cardiovascular:     Rate and Rhythm: Normal rate and regular rhythm.     Heart sounds: Normal heart sounds.  Pulmonary:     Effort: Pulmonary effort is normal.     Breath sounds: Normal breath sounds.  Skin:    General: Skin is warm and dry.  Neurological:     Mental Status: She is alert.      Assessment/Plan: Please see individual problem list.  Hypothyroidism, unspecified type Assessment & Plan: Chronic issue.  Check TSH.  Continue Synthroid 75 mcg daily.  Refer to a new endocrinologist.  Orders: -     TSH -     Ambulatory referral to Endocrinology  Primary hypertension Assessment & Plan: Chronic issue.  I am not sure  her home blood pressure cuff is accurate given that our blood pressure reading today is acceptable.  She will return for nurse visit to compare her blood pressure cuff to our readings.  Discussed if her cuff is accurate then we need to alter her blood pressure medication regimen.  For now she will continue amlodipine 5 mg daily.   Mixed hyperlipidemia Assessment & Plan: Chronic issue.  Continue Crestor 5 mg Monday, Wednesday, and Friday.  Recent LDL adequately controlled.   Chronic pain of right knee Assessment & Plan: Chronic issue.  Patient reports mild arthritis.  She will continue to follow with orthopedics.  Discussed that she will know when she is ready for a knee replacement.      Health Maintenance: Patient  had colonoscopy through [place].  We are requesting records.  Return for this week for nurse BP check, 3 months PCP BP.   Marikay Alar, MD [facility]

## 2023-03-10 ENCOUNTER — Telehealth: Payer: Self-pay | Admitting: Internal Medicine

## 2023-03-10 ENCOUNTER — Ambulatory Visit: Payer: Medicare HMO

## 2023-03-10 DIAGNOSIS — G8929 Other chronic pain: Secondary | ICD-10-CM | POA: Insufficient documentation

## 2023-03-10 DIAGNOSIS — I10 Essential (primary) hypertension: Secondary | ICD-10-CM

## 2023-03-10 NOTE — Assessment & Plan Note (Signed)
Chronic issue.  Continue Crestor 5 mg Monday, Wednesday, and Friday.  Recent LDL adequately controlled.

## 2023-03-10 NOTE — Assessment & Plan Note (Signed)
Chronic issue.  Patient reports mild arthritis.  She will continue to follow with orthopedics.  Discussed that she will know when she is ready for a knee replacement.

## 2023-03-10 NOTE — Assessment & Plan Note (Signed)
Chronic issue.  I am not sure her home blood pressure cuff is accurate given that our blood pressure reading today is acceptable.  She will return for nurse visit to compare her blood pressure cuff to our readings.  Discussed if her cuff is accurate then we need to alter her blood pressure medication regimen.  For now she will continue amlodipine 5 mg daily.

## 2023-03-10 NOTE — Telephone Encounter (Signed)
Received nursing note -  Pt presented in office for a BP check pt was kind of anxious so I had her sit and relax for about 10 mins before I took her BP. Afetr the 10 mins I took pt BP in left arm and I got a reading of 134/82 O2 was 97 Hr was 92. Pt had her Bp machine from home and after letting her sit for a few mins I took her BP in her left arm and her readings were 136/86 O2 was 97 Hr was 88. I advised pt to keep a check on Bp by keeping a record of her numbers and bring with her to her next appt because she stated that she gets ALL types of readings at any given time.   Agree with continuing to spot check blood pressure.  Forwarded to PCP.

## 2023-03-10 NOTE — Assessment & Plan Note (Signed)
Chronic issue.  Check TSH.  Continue Synthroid 75 mcg daily.  Refer to a new endocrinologist.

## 2023-03-10 NOTE — Progress Notes (Signed)
Pt presented in office for a BP check pt was kind of anxious so I had her sit and relax for about 10 mins before I took her BP. Afetr the 10 mins I took pt BP in left arm and I got a reading of 134/82 O2 was 97 Hr was 92. Pt had her Bp machine from home and after letting her sit for a few mins I took her BP in her left arm and her readings were 136/86 O2 was 97 Hr was 88. I advised pt to keep a check on Bp by keeping a record of her numbers and bring with her to her next appt because she stated that she gets ALL types of readings at any given time.

## 2023-03-11 NOTE — Telephone Encounter (Signed)
Pt returned call and note was read. Pt is scheduled for 9/18 at 1120

## 2023-03-11 NOTE — Telephone Encounter (Signed)
Called Patient regarding Dr Purvis Sheffield message below and to schedule a BP FU in one month.

## 2023-03-11 NOTE — Telephone Encounter (Signed)
Agree with continued spot checks.  She was to start checking once daily as well when she has been at rest for 10 to 15 minutes.  We can follow-up on her blood pressure in the office in about a month.  If she is not scheduled around that time please get her scheduled.  Thanks.

## 2023-03-19 ENCOUNTER — Encounter (INDEPENDENT_AMBULATORY_CARE_PROVIDER_SITE_OTHER): Payer: Self-pay

## 2023-03-20 ENCOUNTER — Encounter: Payer: Self-pay | Admitting: Family Medicine

## 2023-03-23 NOTE — Telephone Encounter (Signed)
This patient needs a follow-up visit with me in about a week for her blood pressure and GI issues.  Please get this scheduled.

## 2023-03-24 ENCOUNTER — Telehealth: Payer: Self-pay

## 2023-03-24 NOTE — Telephone Encounter (Signed)
LMTCB

## 2023-03-24 NOTE — Telephone Encounter (Signed)
Patient just called back. I read message to her. I scheduled her an appointment on 8-29.

## 2023-03-24 NOTE — Telephone Encounter (Signed)
Left message for the Patient to call the office back to schedule an appointment around 04/02/23 for BP & GI issues per Dr. Birdie Sons.

## 2023-03-25 NOTE — Telephone Encounter (Signed)
Patient is scheduled for 04/02/23.

## 2023-03-27 NOTE — Telephone Encounter (Signed)
Noted  

## 2023-04-02 ENCOUNTER — Encounter: Payer: Self-pay | Admitting: Family Medicine

## 2023-04-02 ENCOUNTER — Ambulatory Visit (INDEPENDENT_AMBULATORY_CARE_PROVIDER_SITE_OTHER): Payer: Medicare HMO | Admitting: Family Medicine

## 2023-04-02 VITALS — BP 118/80 | HR 53 | Temp 98.1°F | Ht 63.0 in | Wt 126.4 lb

## 2023-04-02 DIAGNOSIS — E039 Hypothyroidism, unspecified: Secondary | ICD-10-CM

## 2023-04-02 DIAGNOSIS — I10 Essential (primary) hypertension: Secondary | ICD-10-CM

## 2023-04-02 DIAGNOSIS — E041 Nontoxic single thyroid nodule: Secondary | ICD-10-CM | POA: Diagnosis not present

## 2023-04-02 NOTE — Assessment & Plan Note (Signed)
Chronic issue.  Continue Synthroid 75 mcg daily.  Discussed she should keep her follow-up with endocrinology.

## 2023-04-02 NOTE — Progress Notes (Signed)
  Marikay Alar, MD Phone: [phone]  Erin [name] is a 72 y.o. female who presents today for follow-up.  Hypertension: Blood pressures have been less than 120/80 for the most part since coming off amlodipine.  She has no chest pain, shortness of breath, or edema.  Notes her GI symptoms resolved with discontinuing the amlodipine.  History of thyroid cancer/hypothyroidism: Patient sees endocrinology in the next couple of months.  She was following with Dr. Patrecia Pace and he retired.  She brings in records today to be scanned into the chart.  She remains on Synthroid.  Social History   Tobacco Use  Smoking Status Never  Smokeless Tobacco Never    Current Outpatient Medications on File Prior to Visit  Medication Sig Dispense Refill   amitriptyline (ELAVIL) 50 MG tablet TAKE 1 TABLET BY MOUTH EVERYDAY AT BEDTIME 90 tablet 1   amLODipine (NORVASC) 5 MG tablet Take 1 tablet (5 mg total) by mouth daily. 90 tablet 1   cholecalciferol (VITAMIN D) 1000 UNITS tablet Take 1,000 Units by mouth daily.     levothyroxine (SYNTHROID) 75 MCG tablet TAKE 1 TABLET (75 MCG TOTAL) BY MOUTH DAILY BEFORE BREAKFAST. 90 tablet 3   Multiple Vitamins-Minerals (CENTRUM SILVER PO) Take by mouth.     omeprazole (PRILOSEC) 20 MG capsule TAKE 1 CAPSULE BY MOUTH EVERY DAY 90 capsule 2   rosuvastatin (CRESTOR) 5 MG tablet TAKE 5 MG (1 TABLET) BY MOUTH ON MONDAY, WEDNESDAY, AND FRIDAY 36 tablet 3   No current facility-administered medications on file prior to visit.     ROS see history of present illness  Objective  Physical Exam Vitals:   04/02/23 1406  BP: 118/80  Pulse: (!) 53  Temp: 98.1 F (36.7 C)  SpO2: 98%    BP Readings from Last 3 Encounters:  04/02/23 118/80  03/09/23 126/78  09/08/22 110/70   Wt Readings from Last 3 Encounters:  04/02/23 126 lb 6.4 oz (57.3 kg)  03/09/23 127 lb 3.2 oz (57.7 kg)  09/08/22 126 lb 6.4 oz (57.3 kg)    Physical Exam Constitutional:      General: She  is not in acute distress.    Appearance: She is not diaphoretic.  Cardiovascular:     Rate and Rhythm: Normal rate and regular rhythm.     Heart sounds: Normal heart sounds.  Pulmonary:     Effort: Pulmonary effort is normal.     Breath sounds: Normal breath sounds.  Musculoskeletal:     Right lower leg: No edema.     Left lower leg: No edema.  Skin:    General: Skin is warm and dry.  Neurological:     Mental Status: She is alert.      Assessment/Plan: Please see individual problem list.  Primary hypertension Assessment & Plan: Chronic issue.  Well-controlled.  She will monitor and let us know if it goes up above 130/80 consistently.   Hypothyroidism, unspecified type Assessment & Plan: Chronic issue.  Continue Synthroid 75 mcg daily.  Discussed she should keep her follow-up with endocrinology.   Thyroid nodule Assessment & Plan: Chronic issue.  She will follow-up with a new endocrinologist for this issue.     No follow-ups on file. Patient will follow-up as scheduled.  Marikay Alar, MD [facility]

## 2023-04-02 NOTE — Assessment & Plan Note (Signed)
Chronic issue.  Well-controlled.  She will monitor and let us know if it goes up above 130/80 consistently.

## 2023-04-02 NOTE — Assessment & Plan Note (Signed)
Chronic issue.  She will follow-up with a new endocrinologist for this issue.

## 2023-04-04 ENCOUNTER — Other Ambulatory Visit: Payer: Self-pay | Admitting: Family Medicine

## 2023-04-21 ENCOUNTER — Ambulatory Visit (INDEPENDENT_AMBULATORY_CARE_PROVIDER_SITE_OTHER): Payer: Medicare HMO | Admitting: *Deleted

## 2023-04-21 VITALS — Ht 63.0 in | Wt 125.0 lb

## 2023-04-21 DIAGNOSIS — Z0000 Encounter for general adult medical examination without abnormal findings: Secondary | ICD-10-CM

## 2023-04-21 NOTE — Progress Notes (Signed)
Subjective:   Erin [name] is a 72 y.o. female who presents for Medicare Annual (Subsequent) preventive examination.  Visit Complete: Virtual  I connected with  Erin [name] on 04/21/23 by a audio enabled telemedicine application and verified that I am speaking with the correct person using two identifiers.  Patient Location: Home  Provider Location: Home Office  I discussed the limitations of evaluation and management by telemedicine. The patient expressed understanding and agreed to proceed.  Vital Signs: Unable to obtain new vitals due to this being a telehealth visit.   Cardiac Risk Factors include: advanced age (>2men, >42 women);dyslipidemia;hypertension     Objective:    Today's Vitals   04/21/23 1015  Weight: 125 lb (56.7 kg)  Height: 5\' 3"  (1.6 m)   Body mass index is 22.14 kg/m.     04/21/2023   10:29 AM 04/15/2022    8:21 AM 03/07/2021   11:42 AM 03/06/2020    9:24 AM 12/20/2019   10:53 AM 11/29/2019    6:58 AM 03/04/2019    9:10 AM  Advanced Directives  Does Patient Have a Medical Advance Directive? Yes Yes Yes Yes Yes Yes Yes  Type of Estate agent of [place];Living will Healthcare Power of [place];Living will Healthcare Power of [place];Living will Healthcare Power of [place];Living will Healthcare Power of [place];Living will Healthcare Power of [place];Living will Healthcare Power of [place];Living will  Does patient want to make changes to medical advance directive?  No - Patient declined No - Patient declined No - Patient declined  No - Patient declined No - Patient declined  Copy of Healthcare Power of Attorney in Chart? No - copy requested No - copy requested No - copy requested No - copy requested No - copy requested No - copy requested No - copy requested    Current Medications (verified) Outpatient Encounter Medications as of 04/21/2023  Medication Sig   amitriptyline (ELAVIL) 50 MG tablet TAKE 1 TABLET BY MOUTH EVERYDAY  AT BEDTIME   cholecalciferol (VITAMIN D) 1000 UNITS tablet Take 1,000 Units by mouth daily.   levothyroxine (SYNTHROID) 75 MCG tablet TAKE 1 TABLET (75 MCG TOTAL) BY MOUTH DAILY BEFORE BREAKFAST.   Multiple Vitamins-Minerals (CENTRUM SILVER PO) Take by mouth.   omeprazole (PRILOSEC) 20 MG capsule TAKE 1 CAPSULE BY MOUTH EVERY DAY   rosuvastatin (CRESTOR) 5 MG tablet TAKE 5 MG (1 TABLET) BY MOUTH ON MONDAY, WEDNESDAY, AND FRIDAY   amLODipine (NORVASC) 5 MG tablet Take 1 tablet (5 mg total) by mouth daily. (Patient not taking: Reported on 04/21/2023)   No facility-administered encounter medications on file as of 04/21/2023.    Allergies (verified) Amoxicillin, Atorvastatin, and Codeine   History: Past Medical History:  Diagnosis Date   Cancer (HCC) 2011   thyroid, Dr. Chestine Spore   GERD (gastroesophageal reflux disease)    Hyperlipidemia    Hx   Hypertension    PONV (postoperative nausea and vomiting)    Thyroid disease    Past Surgical History:  Procedure Laterality Date   CATARACT EXTRACTION W/PHACO Right 11/29/2019   Procedure: CATARACT EXTRACTION PHACO AND INTRAOCULAR LENS PLACEMENT (IOC) RIGHT VIVITY TORIC LENS 8.80 00:45.2 ;  Surgeon: Galen Manila, MD;  Location: [facility];  Service: Ophthalmology;  Laterality: Right;   CATARACT EXTRACTION W/PHACO Left 12/20/2019   Procedure: CATARACT EXTRACTION PHACO AND INTRAOCULAR LENS PLACEMENT (IOC) LEFT VIVITY TORIC LENS 7.51  00:43.7;  Surgeon: Galen Manila, MD;  Location: [facility];  Service: Ophthalmology;  Laterality:  Left;   CHOLECYSTECTOMY     COLONOSCOPY WITH PROPOFOL N/A 12/23/2016   Procedure: COLONOSCOPY WITH PROPOFOL;  Surgeon: Christena Deem, MD;  Location: [facility] END[facility]COPY;  Service: Endoscopy;  Laterality: N/A;   GALLBLADDER SURGERY  2000   KNEE SURGERY  2003/ 2010   right knee x2 Dr. Ernest Pine for torn meniscus   SHOULDER SURGERY  1990   right, arthritis   THYROIDECTOMY  2011   right side   Family  History  Problem Relation Age of Onset   Stroke Mother    Heart disease Father    Heart attack Father    Cancer Maternal Grandmother        pancreatic   Breast cancer Neg Hx    Social History   Socioeconomic History   Marital status: Single    Spouse name: Not on file   Number of children: Not on file   Years of education: Not on file   Highest education level: Not on file  Occupational History   Not on file  Tobacco Use   Smoking status: Never   Smokeless tobacco: Never  Vaping Use   Vaping status: Never Used  Substance and Sexual Activity   Alcohol use: Yes    Alcohol/week: 0.0 standard drinks of alcohol    Comment: socially (1x/week)   Drug use: No   Sexual activity: Never    Partners: Female  Other Topics Concern   Not on file  Social History Narrative   Lives in [place].      Work - [employer]   Social Determinants of Corporate investment banker Strain: Low Risk  (04/21/2023)   Overall Financial Resource Strain (CARDIA)    Difficulty of Paying Living Expenses: Not hard at all  Food Insecurity: No Food Insecurity (04/21/2023)   Hunger Vital Sign    Worried About Running Out of Food in the Last Year: Never true    Ran Out of Food in the Last Year: Never true  Transportation Needs: No Transportation Needs (04/21/2023)   PRAPARE - Administrator, Civil Service (Medical): No    Lack of Transportation (Non-Medical): No  Physical Activity: Sufficiently Active (04/21/2023)   Exercise Vital Sign    Days of Exercise per Week: 4 days    Minutes of Exercise per Session: 40 min  Stress: No Stress Concern Present (04/21/2023)   [employer] of Occupational Health - Occupational Stress Questionnaire    Feeling of Stress : Only a little  Social Connections: Socially Isolated (04/21/2023)   Social Connection and Isolation Panel [NHANES]    Frequency of Communication with Friends and Family: More than three times a week    Frequency of Social Gatherings with  Friends and Family: Twice a week    Attends Religious Services: Never    Database administrator or Organizations: No    Attends Engineer, structural: Never    Marital Status: Never married    Tobacco Counseling Counseling given: Not Answered   Clinical Intake:  Pre-visit preparation completed: Yes  Pain : No/denies pain     BMI - recorded: 22.14 Nutritional Status: BMI of 19-24  Normal Nutritional Risks: None Diabetes: No  How often do you need to have someone help you when you read instructions, pamphlets, or other written materials from your doctor or pharmacy?: 1 - Never  Interpreter Needed?: No  Information entered by :: R. Hashem Goynes LPN   Activities of Daily Living    04/21/2023  10:17 AM  In your present state of health, do you have any difficulty performing the following activities:  Hearing? 0  Vision? 0  Comment glasses  Difficulty concentrating or making decisions? 0  Walking or climbing stairs? 0  Dressing or bathing? 0  Doing errands, shopping? 0  Preparing Food and eating ? N  Using the Toilet? N  In the past six months, have you accidently leaked urine? N  Do you have problems with loss of bowel control? N  Managing your Medications? N  Managing your Finances? N  Housekeeping or managing your Housekeeping? N    Patient Care Team: Glori Luis, MD as PCP - General (Family Medicine)  Indicate any recent Medical Services you may have received from other than [facility] providers in the past year (date may be approximate).     Assessment:   This is a routine wellness examination for [place].  Hearing/Vision screen Hearing Screening - Comments:: No issues Vision Screening - Comments:: glasses   Goals Addressed             This Visit's Progress    Patient Stated       Wants to continue to exercise and eat well       Depression Screen    04/21/2023   10:23 AM 04/02/2023    2:08 PM 03/09/2023    1:29 PM 09/08/2022    8:58 AM  04/15/2022    8:25 AM 03/04/2022    9:04 AM 11/19/2021   10:00 AM  PHQ 2/9 Scores  PHQ - 2 Score 0 0 0 0 0 0 0  PHQ- 9 Score 1 0 1    0    Fall Risk    04/21/2023   10:19 AM 04/02/2023    2:08 PM 03/09/2023    1:29 PM 09/08/2022    8:58 AM 04/15/2022    8:24 AM  Fall Risk   Falls in the past year? 0 0 0 0 0  Number falls in past yr: 0 0 0 0 0  Injury with Fall? 0 0 0 0 0  Risk for fall due to : No Fall Risks No Fall Risks No Fall Risks No Fall Risks   Follow up Falls prevention discussed;Falls evaluation completed Falls evaluation completed Falls evaluation completed Falls evaluation completed Falls evaluation completed    MEDICARE RISK AT HOME: Medicare Risk at Home Any stairs in or around the home?: Yes If so, are there any without handrails?: No Home free of loose throw rugs in walkways, pet beds, electrical cords, etc?: Yes Adequate lighting in your home to reduce risk of falls?: Yes Life alert?: No Use of a cane, walker or w/c?: No Grab bars in the bathroom?: Yes Shower chair or bench in shower?: Yes Elevated toilet seat or a handicapped toilet?: Yes     Cognitive Function:    03/06/2020    9:27 AM  MMSE - Mini Mental State Exam  Not completed: Unable to complete        04/21/2023   10:30 AM 04/15/2022    8:25 AM 03/04/2019    9:13 AM  6CIT Screen  What Year? 0 points 0 points 0 points  What month? 0 points 0 points 0 points  What time? 0 points 0 points 0 points  Count back from 20 0 points 0 points 0 points  Months in reverse 0 points 0 points 0 points  Repeat phrase 0 points 0 points 0 points  Total Score 0 points  0 points 0 points    Immunizations Immunization History  Administered Date(s) Administered   Fluad Quad(high Dose 65+) 05/12/2019, 06/25/2020, 06/18/2021, 05/27/2022   Influenza, High Dose Seasonal PF 05/28/2017, 06/07/2018   Influenza-Unspecified 04/20/2013, 05/28/2015   PFIZER(Purple Top)SARS-COV-2 Vaccination 03/05/2020, 03/26/2020   Td  01/16/2020   Tdap 12/04/2005    TDAP status: Up to date  Flu Vaccine status: Due, Education has been provided regarding the importance of this vaccine. Advised may receive this vaccine at local pharmacy or Health Dept. Aware to provide a copy of the vaccination record if obtained from local pharmacy or Health Dept. Verbalized acceptance and understanding.  Pneumococcal vaccine status: Due, Education has been provided regarding the importance of this vaccine. Advised may receive this vaccine at local pharmacy or Health Dept. Aware to provide a copy of the vaccination record if obtained from local pharmacy or Health Dept. Verbalized acceptance and understanding.  Covid-19 vaccine status: Information provided on how to obtain vaccines.   Qualifies for Shingles Vaccine? Yes   Zostavax completed No   Shingrix Completed?: No.    Education has been provided regarding the importance of this vaccine. Patient has been advised to call insurance company to determine out of pocket expense if they have not yet received this vaccine. Advised may also receive vaccine at local pharmacy or Health Dept. Verbalized acceptance and understanding.  Screening Tests Health Maintenance  Topic Date Due   Pneumonia Vaccine 49+ Years old (1 of 1 - PCV) Never done   Colonoscopy  12/23/2021   Medicare Annual Wellness (AWV)  04/16/2023   INFLUENZA VACCINE  11/02/2023 (Originally 03/05/2023)   MAMMOGRAM  07/16/2023   DTaP/Tdap/Td (3 - Td or Tdap) 01/15/2030   DEXA SCAN  Completed   HPV VACCINES  Aged Out   COVID-19 Vaccine  Discontinued   Hepatitis C Screening  Discontinued   Zoster Vaccines- Shingrix  Discontinued    Health Maintenance  Health Maintenance Due  Topic Date Due   Pneumonia Vaccine 28+ Years old (1 of 1 - PCV) Never done   Colonoscopy  12/23/2021   Medicare Annual Wellness (AWV)  04/16/2023    Colorectal cancer screening: Type of screening: Colonoscopy. Completed 12/23. Repeat every 1-2 years.  Patient is going to find out which doctor did it and will forward the information  Mammogram status: Completed 12/23. Repeat every year  Bone Density/Dexa Status Completed 4/22 Osteopenia repeat 2 years Wants to discuss with PCP at next visit  Lung Cancer Screening: (Low Dose CT Chest recommended if Age 84-80 years, 20 pack-year currently smoking OR have quit w/in 15years.) does not qualify.    Additional Screening:  Hepatitis C Screening: does qualify; Completed Patient declined  Vision Screening: Recommended annual ophthalmology exams for early detection of glaucoma and other disorders of the eye. Is the patient up to date with their annual eye exam?  Yes  Who is the provider or what is the name of the office in which the patient attends annual eye exams? [facility]  If pt is not established with a provider, would they like to be referred to a provider to establish care? No .   Dental Screening: Recommended annual dental exams for proper oral hygiene  D Community Resource Referral / Chronic Care Management: CRR required this visit?  No   CCM required this visit?  No     Plan:     I have personally reviewed and noted the following in the patient's chart:   Medical and social  history Use of alcohol, tobacco or illicit drugs  Current medications and supplements including opioid prescriptions. Patient is not currently taking opioid prescriptions. Functional ability and status Nutritional status Physical activity Advanced directives List of other physicians Hospitalizations, surgeries, and ER visits in previous 12 months Vitals Screenings to include cognitive, depression, and falls Referrals and appointments  In addition, I have reviewed and discussed with patient certain preventive protocols, quality metrics, and best practice recommendations. A written personalized care plan for preventive services as well as general preventive health recommendations were provided to  patient.     Sydell Axon, LPN   [id number]   After Visit Summary: (MyChart) Due to this being a telephonic visit, the after visit summary with patients personalized plan was offered to patient via MyChart   Nurse Notes: None

## 2023-04-21 NOTE — Patient Instructions (Signed)
Erin [name] , Thank you for taking time to come for your Medicare Wellness Visit. I appreciate your ongoing commitment to your health goals. Please review the following plan we discussed and let me know if I can assist you in the future.   Referrals/Orders/Follow-Ups/Clinician Recommendations: None  This is a list of the screening recommended for you and due dates:  Health Maintenance  Topic Date Due   Pneumonia Vaccine (1 of 1 - PCV) Never done   Colon Cancer Screening  12/23/2021   Flu Shot  11/02/2023*   Mammogram  07/16/2023   Medicare Annual Wellness Visit  04/20/2024   DTaP/Tdap/Td vaccine (3 - Td or Tdap) 01/15/2030   DEXA scan (bone density measurement)  Completed   HPV Vaccine  Aged Out   COVID-19 Vaccine  Discontinued   Hepatitis C Screening  Discontinued   Zoster (Shingles) Vaccine  Discontinued  *Topic was postponed. The date shown is not the original due date.    Advanced directives: (Copy Requested) Please bring a copy of your health care power of attorney and living will to the office to be added to your chart at your convenience.  Next Medicare Annual Wellness Visit scheduled for next year: Yes 04/22/24 @ 10:30

## 2023-04-22 ENCOUNTER — Ambulatory Visit: Payer: Medicare HMO | Admitting: Family Medicine

## 2023-05-06 ENCOUNTER — Other Ambulatory Visit: Payer: Self-pay | Admitting: Family Medicine

## 2023-05-06 DIAGNOSIS — E890 Postprocedural hypothyroidism: Secondary | ICD-10-CM | POA: Diagnosis not present

## 2023-05-06 DIAGNOSIS — C73 Malignant neoplasm of thyroid gland: Secondary | ICD-10-CM | POA: Diagnosis not present

## 2023-05-06 DIAGNOSIS — I10 Essential (primary) hypertension: Secondary | ICD-10-CM

## 2023-06-02 DIAGNOSIS — D2272 Melanocytic nevi of left lower limb, including hip: Secondary | ICD-10-CM | POA: Diagnosis not present

## 2023-06-02 DIAGNOSIS — D2271 Melanocytic nevi of right lower limb, including hip: Secondary | ICD-10-CM | POA: Diagnosis not present

## 2023-06-02 DIAGNOSIS — L821 Other seborrheic keratosis: Secondary | ICD-10-CM | POA: Diagnosis not present

## 2023-06-02 DIAGNOSIS — Z85828 Personal history of other malignant neoplasm of skin: Secondary | ICD-10-CM | POA: Diagnosis not present

## 2023-06-02 DIAGNOSIS — D2262 Melanocytic nevi of left upper limb, including shoulder: Secondary | ICD-10-CM | POA: Diagnosis not present

## 2023-06-02 DIAGNOSIS — D225 Melanocytic nevi of trunk: Secondary | ICD-10-CM | POA: Diagnosis not present

## 2023-06-02 DIAGNOSIS — L570 Actinic keratosis: Secondary | ICD-10-CM | POA: Diagnosis not present

## 2023-06-02 DIAGNOSIS — D2261 Melanocytic nevi of right upper limb, including shoulder: Secondary | ICD-10-CM | POA: Diagnosis not present

## 2023-06-09 ENCOUNTER — Other Ambulatory Visit: Payer: Self-pay | Admitting: Family Medicine

## 2023-06-09 DIAGNOSIS — Z1231 Encounter for screening mammogram for malignant neoplasm of breast: Secondary | ICD-10-CM

## 2023-06-12 ENCOUNTER — Encounter: Payer: Self-pay | Admitting: Family Medicine

## 2023-06-15 ENCOUNTER — Encounter: Payer: Self-pay | Admitting: Family Medicine

## 2023-06-15 ENCOUNTER — Telehealth: Payer: Self-pay | Admitting: Family Medicine

## 2023-06-15 ENCOUNTER — Ambulatory Visit (INDEPENDENT_AMBULATORY_CARE_PROVIDER_SITE_OTHER): Payer: Medicare HMO | Admitting: Family Medicine

## 2023-06-15 VITALS — BP 114/68 | HR 93 | Temp 98.2°F | Ht 63.0 in | Wt 131.2 lb

## 2023-06-15 DIAGNOSIS — I10 Essential (primary) hypertension: Secondary | ICD-10-CM | POA: Diagnosis not present

## 2023-06-15 DIAGNOSIS — K219 Gastro-esophageal reflux disease without esophagitis: Secondary | ICD-10-CM

## 2023-06-15 DIAGNOSIS — G8929 Other chronic pain: Secondary | ICD-10-CM | POA: Diagnosis not present

## 2023-06-15 DIAGNOSIS — R519 Headache, unspecified: Secondary | ICD-10-CM | POA: Diagnosis not present

## 2023-06-15 DIAGNOSIS — Z23 Encounter for immunization: Secondary | ICD-10-CM | POA: Diagnosis not present

## 2023-06-15 DIAGNOSIS — M25561 Pain in right knee: Secondary | ICD-10-CM | POA: Diagnosis not present

## 2023-06-15 DIAGNOSIS — F4323 Adjustment disorder with mixed anxiety and depressed mood: Secondary | ICD-10-CM | POA: Diagnosis not present

## 2023-06-15 NOTE — Telephone Encounter (Signed)
Lft pt vm to call ofc to sch MRI. thanks 

## 2023-06-15 NOTE — Assessment & Plan Note (Addendum)
Has been going on for several months.  Neurological exam today is reassuring.  Discussed given her age and new onset headaches I would recommend doing an MRI brain without contrast to evaluate for underlying causes.  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

## 2023-06-15 NOTE — Assessment & Plan Note (Signed)
Chronic issue.  Adequately controlled.  Continue omeprazole 20 mg daily.

## 2023-06-15 NOTE — Progress Notes (Signed)
Marikay Alar, MD Phone: [phone]  Erin [name] is a 72 y.o. female who presents today for follow-up.  Anxiety/depression: Patient has occasional anxiety.  No depression.  She is on amitriptyline.  No SI.  GERD: Taking omeprazole.  No reflux, abdominal pain, blood in her stool, or dysphagia.  Headaches: Patient notes these have been going on for couple of months.  They occur on the left side of her head.  Radiate down towards her neck.  Occur late in the afternoon or early in the evening.  She saw a chiropractor and they think they are related to her neck and likely osteoarthritis.  She notes some benefit from seeing the chiropractor.  She notes Friday night she woke up with a headache.  She notes no photophobia or phonophobia.  No nausea.  No numbness, weakness, or vision changes with these.  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  She is considering seeing orthopedics to have Synvisc injections for this.  She last saw orthopedics over the summer.  Social History   Tobacco Use  Smoking Status Never  Smokeless Tobacco Never    Current Outpatient Medications on File Prior to Visit  Medication Sig Dispense Refill   amitriptyline (ELAVIL) 50 MG tablet TAKE 1 TABLET BY MOUTH EVERYDAY AT BEDTIME 90 tablet 1   cholecalciferol (VITAMIN D) 1000 UNITS tablet Take 1,000 Units by mouth daily.     levothyroxine (SYNTHROID) 75 MCG tablet TAKE 1 TABLET (75 MCG TOTAL) BY MOUTH DAILY BEFORE BREAKFAST. 90 tablet 3   Multiple Vitamins-Minerals (CENTRUM SILVER PO) Take by mouth.     omeprazole (PRILOSEC) 20 MG capsule TAKE 1 CAPSULE BY MOUTH EVERY DAY 90 capsule 2   rosuvastatin (CRESTOR) 5 MG tablet TAKE 5 MG (1 TABLET) BY MOUTH ON MONDAY, WEDNESDAY, AND FRIDAY 36 tablet 3   No current facility-administered medications on file prior to visit.     ROS see history of present illness  Objective  Physical Exam Vitals:   06/15/23 1107  BP: 114/68  Pulse: 93  Temp: 98.2  F (36.8 C)  SpO2: 97%    BP Readings from Last 3 Encounters:  06/15/23 114/68  04/02/23 118/80  03/09/23 126/78   Wt Readings from Last 3 Encounters:  06/15/23 131 lb 3.2 oz (59.5 kg)  04/21/23 125 lb (56.7 kg)  04/02/23 126 lb 6.4 oz (57.3 kg)    Physical Exam Constitutional:      General: She is not in acute distress.    Appearance: She is not diaphoretic.  Cardiovascular:     Rate and Rhythm: Normal rate and regular rhythm.     Heart sounds: Normal heart sounds.  Pulmonary:     Effort: Pulmonary effort is normal.     Breath sounds: Normal breath sounds.  Skin:    General: Skin is warm and dry.  Neurological:     Mental Status: She is alert.     Comments: CN 3-12 intact, 5/5 strength in bilateral biceps, triceps, grip, quads, hamstrings, plantar and dorsiflexion, sensation to light touch intact in bilateral UE and LE, normal gait      Assessment/Plan: Please see individual problem list.  Primary hypertension Assessment & Plan: Adequately controlled.  Patient is no longer on medication.  We will resolve this problem.   Adjustment disorder with mixed anxiety and depressed mood Assessment & Plan: Chronic issue.  Adequately controlled.  Patient will continue amitriptyline 50 mg daily.   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  Encouraged her to follow-up with orthopedics to consider Synvisc injections as those could be beneficial for her.  She is hesitant to have surgery at this time.   Gastroesophageal reflux disease, unspecified whether esophagitis present Assessment & Plan: Chronic issue.  Adequately controlled.  Continue omeprazole 20 mg daily.   New onset of headaches after age 57 Assessment & Plan: Has been going on for several months.  Neurological exam today is reassuring.  Discussed given her age and new onset headaches I would recommend doing an MRI brain without contrast to evaluate for  underlying causes.  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  Orders: -     MR BRAIN WO CONTRAST; Future  Need for influenza vaccination -     Flu Vaccine Trivalent High Dose (Fluad)   Health maintenance: Requesting colonoscopy report from December 2023.  Return in about 6 months (around 12/13/2023) for Transfer of care with Claris Che.   Marikay Alar, MD [facility]

## 2023-06-15 NOTE — Telephone Encounter (Signed)
Plan to discuss during visit today.

## 2023-06-15 NOTE — Assessment & Plan Note (Signed)
Chronic issue.  Adequately controlled.  Patient will continue amitriptyline 50 mg daily.

## 2023-06-15 NOTE — Assessment & Plan Note (Signed)
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  Encouraged her to follow-up with orthopedics to consider Synvisc injections as those could be beneficial for her.  She is hesitant to have surgery at this time.

## 2023-06-15 NOTE — Assessment & Plan Note (Signed)
Adequately controlled.  Patient is no longer on medication.  We will resolve this problem.

## 2023-06-24 ENCOUNTER — Ambulatory Visit
Admission: RE | Admit: 2023-06-24 | Discharge: 2023-06-24 | Disposition: A | Discharge status: Home / Self Care | Payer: Medicare HMO | Source: Ambulatory Visit | Attending: Family Medicine | Admitting: Family Medicine

## 2023-06-24 DIAGNOSIS — R519 Headache, unspecified: Secondary | ICD-10-CM | POA: Insufficient documentation

## 2023-07-20 ENCOUNTER — Ambulatory Visit
Admission: RE | Admit: 2023-07-20 | Discharge: 2023-07-20 | Disposition: A | Discharge status: Home / Self Care | Payer: Medicare HMO | Source: Ambulatory Visit | Attending: Family Medicine | Admitting: Family Medicine

## 2023-07-20 DIAGNOSIS — Z1231 Encounter for screening mammogram for malignant neoplasm of breast: Secondary | ICD-10-CM | POA: Diagnosis not present

## 2023-08-11 ENCOUNTER — Encounter: Payer: Self-pay | Admitting: Family Medicine

## 2023-08-11 MED ORDER — AMITRIPTYLINE HCL 50 MG PO TABS: ORAL_TABLET | ORAL | 1 refills | Status: DC

## 2023-08-11 NOTE — Telephone Encounter (Signed)
 Medication was refilled. Pt sending well wishes

## 2023-10-07 ENCOUNTER — Other Ambulatory Visit: Payer: Self-pay

## 2023-10-07 DIAGNOSIS — E782 Mixed hyperlipidemia: Secondary | ICD-10-CM

## 2023-10-07 MED ORDER — ROSUVASTATIN CALCIUM 5 MG PO TABS: ORAL_TABLET | ORAL | 3 refills | Status: DC

## 2023-11-06 ENCOUNTER — Other Ambulatory Visit: Payer: Self-pay

## 2023-11-06 MED ORDER — OMEPRAZOLE 20 MG PO CPDR: DELAYED_RELEASE_CAPSULE | ORAL | 1 refills | Status: DC

## 2023-11-29 ENCOUNTER — Ambulatory Visit
Admission: RE | Admit: 2023-11-29 | Discharge: 2023-11-29 | Disposition: A | Discharge status: Home / Self Care | Source: Ambulatory Visit | Attending: Emergency Medicine | Admitting: Emergency Medicine

## 2023-11-29 VITALS — BP 124/85 | HR 102 | Temp 98.4°F | Ht 63.0 in | Wt 130.0 lb

## 2023-11-29 DIAGNOSIS — R051 Acute cough: Secondary | ICD-10-CM | POA: Diagnosis not present

## 2023-11-29 MED ORDER — BENZONATATE 100 MG PO CAPS
100.0000 mg | ORAL_CAPSULE | Freq: Three times a day (TID) | ORAL | 0 refills | Status: DC
Start: 2023-11-29 — End: 2024-01-14

## 2023-11-29 MED ORDER — PROMETHAZINE-DM 6.25-15 MG/5ML PO SYRP: 5.0000 mL | ORAL_SOLUTION | Freq: Every evening | ORAL | 0 refills | Status: DC | PRN

## 2023-11-29 MED ORDER — AZITHROMYCIN 250 MG PO TABS: 250.0000 mg | ORAL_TABLET | Freq: Every day | ORAL | 0 refills | Status: DC

## 2023-11-29 NOTE — ED Triage Notes (Signed)
 Pt c/o cough x4days  Pt has been taking OTC mucinex cough & chest congestion

## 2023-11-29 NOTE — Discharge Instructions (Signed)
 Your symptoms today are most likely being caused by a virus and should steadily improve in time it can take up to 7 to 10 days before you truly start to see a turnaround however things will get better, improvement seen by Wednesday may pick up azithromycin  from the pharmacy  In the meantime take Tessalon every 8 hours as needed for cough, may use cough syrup at bedtime if having difficulty resting    You can take Tylenol  and/or Ibuprofen as needed for fever reduction and pain relief.   For cough: honey 1/2 to 1 teaspoon (you can dilute the honey in water or another fluid).  You can also use guaifenesin and dextromethorphan for cough. You can use a humidifier for chest congestion and cough.  If you don't have a humidifier, you can sit in the bathroom with the hot shower running.      For sore throat: try warm salt water gargles, cepacol lozenges, throat spray, warm tea or water with lemon/honey, popsicles or ice, or OTC cold relief medicine for throat discomfort.   For congestion: take a daily anti-histamine like Zyrtec, Claritin, and a oral decongestant, such as pseudoephedrine.  You can also use Flonase  1-2 sprays in each nostril daily.   It is important to stay hydrated: drink plenty of fluids (water, gatorade/powerade/pedialyte, juices, or teas) to keep your throat moisturized and help further relieve irritation/discomfort.

## 2023-11-29 NOTE — ED Provider Notes (Signed)
 [name]    CSN: [id number] Arrival date & time: 11/29/23  0825      History   Chief Complaint Chief Complaint  Patient presents with   Cough    HPI Erin [name] is a 73 y.o. female.   Patient presents for evaluation of a productive cough present for 4 days.  Experience low-grade fever yesterday peaking at 100.  Started after recent travel but no known sick contacts.  Has attempted use of Mucinex chest pain congestion and Tylenol .  Denies shortness of breath or wheezing, ear pain, sore throat.  Denies fever.  Past Medical History:  Diagnosis Date   Cancer [facility]) 2011   thyroid , Dr. Fulton Job   GERD (gastroesophageal reflux disease)    Hyperlipidemia    Hx   Hypertension    PONV (postoperative nausea and vomiting)    Thyroid  disease     Patient Active Problem List   Diagnosis Date Noted   New onset of headaches after age 32 06/15/2023   Chronic pain of right knee 03/10/2023   Elevated hemoglobin (HCC) 12/02/2021   Loose stools 01/28/2021   Basal cell carcinoma 10/10/2019   Palpitations 07/06/2019   Thyroid  nodule 01/05/2019   Colon polyps 05/28/2017   Neck pain on right side 07/09/2016   Adjustment disorder with mixed anxiety and depressed mood 11/26/2015   Right hip pain 10/24/2013   Arthralgia 08/11/2013   Osteopenia 06/08/2013   Routine general medical examination at a health care facility 10/20/2012   GERD (gastroesophageal reflux disease) 04/19/2012   Hyperlipidemia 04/19/2012   Hypothyroidism 04/19/2012    Past Surgical History:  Procedure Laterality Date   CATARACT EXTRACTION W/PHACO Right 11/29/2019   Procedure: CATARACT EXTRACTION PHACO AND INTRAOCULAR LENS PLACEMENT (IOC) RIGHT VIVITY TORIC LENS 8.80 00:45.2 ;  Surgeon: Clair Crews, MD;  Location: [facility];  Service: Ophthalmology;  Laterality: Right;   CATARACT EXTRACTION W/PHACO Left 12/20/2019   Procedure: CATARACT EXTRACTION PHACO AND INTRAOCULAR LENS PLACEMENT (IOC)  LEFT VIVITY TORIC LENS 7.51  00:43.7;  Surgeon: Clair Crews, MD;  Location: [facility];  Service: Ophthalmology;  Laterality: Left;   CHOLECYSTECTOMY     COLONOSCOPY WITH PROPOFOL  N/A 12/23/2016   Procedure: COLONOSCOPY WITH PROPOFOL ;  Surgeon: Deveron Fly, MD;  Location: [facility] ENDOSCOPY;  Service: Endoscopy;  Laterality: N/A;   GALLBLADDER SURGERY  2000   KNEE SURGERY  2003/ 2010   right knee x2 Dr. Hooten for torn meniscus   SHOULDER SURGERY  1990   right, arthritis   THYROIDECTOMY  2011   right side    OB History   No obstetric history on file.      Home Medications    Prior to Admission medications   Medication Sig Start Date End Date Taking? Authorizing Provider  amitriptyline  (ELAVIL ) 50 MG tablet TAKE 1 TABLET BY MOUTH EVERYDAY AT BEDTIME 08/11/23  Yes Kent Pear, MD  azithromycin  (ZITHROMAX ) 250 MG tablet Take 1 tablet (250 mg total) by mouth daily. Take first 2 tablets together, then 1 every day until finished. 12/02/23  Yes Ryosuke Ericksen R, NP  benzonatate (TESSALON) 100 MG capsule Take 1 capsule (100 mg total) by mouth every 8 (eight) hours. 11/29/23  Yes Khaden Gater R, NP  cholecalciferol (VITAMIN D ) 1000 UNITS tablet Take 1,000 Units by mouth daily.   Yes [provider]  levothyroxine  (SYNTHROID ) 75 MCG tablet TAKE 1 TABLET (75 MCG TOTAL) BY MOUTH DAILY BEFORE BREAKFAST. 09/08/22  Yes Kent Pear, MD  Multiple Vitamins-Minerals (CENTRUM SILVER PO) Take by mouth.   Yes [provider]  omeprazole  (PRILOSEC) 20 MG capsule TAKE 1 CAPSULE BY MOUTH EVERY DAY 11/06/23  Yes Tullo, Teresa L, MD  promethazine-dextromethorphan (PROMETHAZINE-DM) 6.25-15 MG/5ML syrup Take 5 mLs by mouth at bedtime as needed. 11/29/23  Yes Reena Canning, NP  rosuvastatin  (CRESTOR ) 5 MG tablet Take 5 mg (1 tablet) by mouth on Monday, Wednesday, and Friday 10/07/23  Yes Thersia Flax, MD    Family History Family History  Problem Relation Age of  Onset   Stroke Mother    Heart disease Father    Heart attack Father    Cancer Maternal Grandmother        pancreatic   Breast cancer Neg Hx     Social History Social History   Tobacco Use   Smoking status: Never   Smokeless tobacco: Never  Vaping Use   Vaping status: Never Used  Substance Use Topics   Alcohol use: Yes    Alcohol/week: 0.0 standard drinks of alcohol    Comment: socially (1x/week)   Drug use: No     Allergies   Amoxicillin, Atorvastatin , and Codeine   Review of Systems Review of Systems   Physical Exam Triage Vital Signs ED Triage Vitals  Encounter Vitals Group     BP 11/29/23 0833 124/85     Systolic BP Percentile --      Diastolic BP Percentile --      Pulse Rate 11/29/23 0833 (!) 102     Resp --      Temp 11/29/23 0833 98.4 F (36.9 C)     Temp Source 11/29/23 0833 Oral     SpO2 11/29/23 0833 97 %     Weight 11/29/23 0834 130 lb (59 kg)     Height 11/29/23 0834 5\' 3"  (1.6 m)     Head Circumference --      Peak Flow --      Pain Score 11/29/23 0833 0     Pain Loc --      Pain Education --      Exclude from Growth Chart --    No data found.  Updated Vital Signs BP 124/85 (BP Location: Left Arm)   Pulse (!) 102   Temp 98.4 F (36.9 C) (Oral)   Ht 5\' 3"  (1.6 m)   Wt 130 lb (59 kg)   SpO2 97%   BMI 23.03 kg/m   Visual Acuity Right Eye Distance:   Left Eye Distance:   Bilateral Distance:    Right Eye Near:   Left Eye Near:    Bilateral Near:     Physical Exam Constitutional:      Appearance: Normal appearance.  Eyes:     Extraocular Movements: Extraocular movements intact.  Cardiovascular:     Rate and Rhythm: Normal rate and regular rhythm.     Pulses: Normal pulses.     Heart sounds: Normal heart sounds.  Pulmonary:     Effort: Pulmonary effort is normal.     Breath sounds: Normal breath sounds.  Neurological:     Mental Status: She is alert and oriented to person, place, and time. Mental status is at baseline.       UC Treatments / Results  Labs (all labs ordered are listed, but only abnormal results are displayed) Labs Reviewed - No data to display  EKG   Radiology No results found.  Procedures Procedures (including critical care time)  Medications Ordered in  UC Medications - No data to display  Initial Impression / Assessment and Plan / UC Course  I have reviewed the triage vital signs and the nursing notes.  Pertinent labs & imaging results that were available during my care of the patient were reviewed by me and considered in my medical decision making (see chart for details).  Acute cough  Patient is in no signs of distress nor toxic appearing.  Vital signs are stable.  Low suspicion for pneumonia, pneumothorax or bronchitis and therefore will defer imaging.  Viral testing deferred abdominal illness.  Etiology most likely viral.  Prescribed Tessalon and Promethazine DM illogically antibiotic placed at pharmacy if no improvement seen.May use additional over-the-counter medications as needed for supportive care.  May follow-up with urgent care as needed if symptoms persist or worsen.   Final Clinical Impressions(s) / UC Diagnoses   Final diagnoses:  Acute cough     Discharge Instructions      Your symptoms today are most likely being caused by a virus and should steadily improve in time it can take up to 7 to 10 days before you truly start to see a turnaround however things will get better, improvement seen by Wednesday may pick up azithromycin  from the pharmacy  In the meantime take Tessalon every 8 hours as needed for cough, may use cough syrup at bedtime if having difficulty resting    You can take Tylenol  and/or Ibuprofen as needed for fever reduction and pain relief.   For cough: honey 1/2 to 1 teaspoon (you can dilute the honey in water or another fluid).  You can also use guaifenesin and dextromethorphan for cough. You can use a humidifier for chest congestion and  cough.  If you don't have a humidifier, you can sit in the bathroom with the hot shower running.      For sore throat: try warm salt water gargles, cepacol lozenges, throat spray, warm tea or water with lemon/honey, popsicles or ice, or OTC cold relief medicine for throat discomfort.   For congestion: take a daily anti-histamine like Zyrtec, Claritin, and a oral decongestant, such as pseudoephedrine.  You can also use Flonase  1-2 sprays in each nostril daily.   It is important to stay hydrated: drink plenty of fluids (water, gatorade/powerade/pedialyte, juices, or teas) to keep your throat moisturized and help further relieve irritation/discomfort.    ED Prescriptions     Medication Sig Dispense Auth. Provider   benzonatate (TESSALON) 100 MG capsule Take 1 capsule (100 mg total) by mouth every 8 (eight) hours. 21 capsule Naba Sneed R, NP   promethazine-dextromethorphan (PROMETHAZINE-DM) 6.25-15 MG/5ML syrup Take 5 mLs by mouth at bedtime as needed. 118 mL Luvenia Cranford R, NP   azithromycin  (ZITHROMAX ) 250 MG tablet Take 1 tablet (250 mg total) by mouth daily. Take first 2 tablets together, then 1 every day until finished. 6 tablet Pearle Wandler R, NP      PDMP not reviewed this encounter.   Reena Canning, [place] 11/29/23 [phone]

## 2023-12-07 ENCOUNTER — Encounter: Payer: Self-pay | Admitting: Family

## 2023-12-09 ENCOUNTER — Other Ambulatory Visit: Payer: Self-pay | Admitting: Family

## 2023-12-09 DIAGNOSIS — T753XXA Motion sickness, initial encounter: Secondary | ICD-10-CM

## 2023-12-09 MED ORDER — SCOPOLAMINE 1 MG/3DAYS TD PT72
1.0000 | MEDICATED_PATCH | TRANSDERMAL | 0 refills | Status: DC
Start: 2023-12-09 — End: 2024-04-22

## 2024-01-05 ENCOUNTER — Other Ambulatory Visit (HOSPITAL_COMMUNITY): Payer: Self-pay

## 2024-01-14 ENCOUNTER — Encounter: Payer: Self-pay | Admitting: Family

## 2024-01-14 ENCOUNTER — Ambulatory Visit: Payer: Medicare HMO | Admitting: Family

## 2024-01-14 VITALS — BP 116/70 | HR 76 | Temp 98.2°F | Ht 63.0 in | Wt 134.0 lb

## 2024-01-14 DIAGNOSIS — E782 Mixed hyperlipidemia: Secondary | ICD-10-CM

## 2024-01-14 DIAGNOSIS — Z1211 Encounter for screening for malignant neoplasm of colon: Secondary | ICD-10-CM

## 2024-01-14 DIAGNOSIS — E039 Hypothyroidism, unspecified: Secondary | ICD-10-CM

## 2024-01-14 DIAGNOSIS — R519 Headache, unspecified: Secondary | ICD-10-CM

## 2024-01-14 DIAGNOSIS — Z7689 Persons encountering health services in other specified circumstances: Secondary | ICD-10-CM | POA: Insufficient documentation

## 2024-01-14 DIAGNOSIS — K219 Gastro-esophageal reflux disease without esophagitis: Secondary | ICD-10-CM

## 2024-01-14 DIAGNOSIS — M8580 Other specified disorders of bone density and structure, unspecified site: Secondary | ICD-10-CM | POA: Diagnosis not present

## 2024-01-14 NOTE — Progress Notes (Signed)
 Assessment & Plan:  Encounter to establish care -     DG Bone Density; Future -     Ambulatory referral to Gastroenterology -     CBC with Differential/Platelet; Future -     Comprehensive metabolic panel with GFR; Future -     Hemoglobin A1c; Future -     Lipid panel; Future -     VITAMIN D  25 Hydroxy (Vit-D Deficiency, Fractures); Future  Hypothyroidism, unspecified type  Mixed hyperlipidemia Assessment & Plan: Previously well-controlled.  Pending lipid panel.  Continue Crestor  5 mg Monday Wednesday Friday.  Orders: -     Lipid panel; Future  Osteopenia, unspecified location -     DG Bone Density; Future -     VITAMIN D  25 Hydroxy (Vit-D Deficiency, Fractures); Future  Screen for colon cancer -     Ambulatory referral to Gastroenterology  New onset of headaches after age 89 Assessment & Plan: Reviewed prior MRI.  HA frequency improved.  Continue rescue therapy such as Tylenol .  We discussed amitriptyline  in the setting of headache prevention, chronic pain.  Of note, initially amitriptyline  was started during perimenopause . In the future can consider escalating dose if needed.  Will monitor   Gastroesophageal reflux disease, unspecified whether esophagitis present Assessment & Plan: Chronic, stable. No alarm features at this time.  Continue omeprazole  20 mg      Return precautions given.   Risks, benefits, and alternatives of the medications and treatment plan prescribed today were discussed, and patient expressed understanding.   Education regarding symptom management and diagnosis given to patient on AVS either electronically or printed.  Return in about 6 months (around 07/15/2024) for Fasting labs in 2-3 weeks.  Bascom Bossier, FNP  Subjective:    Patient ID: Erin [name], female    DOB: 1951-01-01, 73 y.o.   MRN: [id number]  CC: Erin [name] is a 73 y.o. female who presents today for follow up.   HPI: Feels well today No new complaints  Walking  daily. Works in her yard.     Chronic HA , frequency improved  2 HA per week Tylenol  with relief.  HA improved with chiropractor adjustment and biofreeze.   Normal MRI brain 06/24/23   She started amitriptyline  50 mg years ago during perimenopause.  Compliant with Crestor  5 mg Monday Wednesday and Friday  Compliant with omeprazole  20 mg daily. No pain with swallowing, choking.   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 This was treated with right hemi-thyroidectomy in 12/2009. No additional treatment was required and she was shortly started on supplemental therapy with BRAND Synthroid .   TSH 1.0 09/09/23  Mammogram UTD  Colonoscopy 07/07/2022, repeat in 2 years  Allergies: Amoxicillin, Atorvastatin , and Codeine Current Outpatient Medications on File Prior to Visit  Medication Sig Dispense Refill   amitriptyline  (ELAVIL ) 50 MG tablet TAKE 1 TABLET BY MOUTH EVERYDAY AT BEDTIME 90 tablet 1   cholecalciferol (VITAMIN D ) 1000 UNITS tablet Take 1,000 Units by mouth daily.     levothyroxine  (SYNTHROID ) 75 MCG tablet TAKE 1 TABLET (75 MCG TOTAL) BY MOUTH DAILY BEFORE BREAKFAST. 90 tablet 3   Multiple Vitamins-Minerals (CENTRUM SILVER PO) Take by mouth.     omeprazole  (PRILOSEC) 20 MG capsule TAKE 1 CAPSULE BY MOUTH EVERY DAY 90 capsule 1   rosuvastatin  (CRESTOR ) 5 MG tablet Take 5 mg (1 tablet) by mouth on Monday, Wednesday, and Friday 36 tablet 3  scopolamine  (TRANSDERM-SCOP) 1 MG/3DAYS Place 1 patch (1.5 mg total) onto the skin every 3 (three) days. 10 patch 0   No current facility-administered medications on file prior to visit.    Review of Systems  Constitutional:  Negative for chills and fever.  Respiratory:  Negative for cough.   Cardiovascular:  Negative for chest pain and palpitations.  Gastrointestinal:  Negative for nausea and vomiting.  Musculoskeletal:  Positive for neck pain  (improved).  Neurological:  Positive for headaches (improved).      Objective:    BP 116/70   Pulse 76   Temp 98.2 F (36.8 C) (Oral)   Ht 5' 3 (1.6 m)   Wt 134 lb (60.8 kg)   SpO2 97%   BMI 23.74 kg/m  BP Readings from Last 3 Encounters:  01/14/24 116/70  11/29/23 124/85  06/15/23 114/68   Wt Readings from Last 3 Encounters:  01/14/24 134 lb (60.8 kg)  11/29/23 130 lb (59 kg)  06/15/23 131 lb 3.2 oz (59.5 kg)    Physical Exam Vitals reviewed.  Constitutional:      Appearance: She is well-developed.   Eyes:     Conjunctiva/sclera: Conjunctivae normal.    Cardiovascular:     Rate and Rhythm: Normal rate and regular rhythm.     Pulses: Normal pulses.     Heart sounds: Normal heart sounds.  Pulmonary:     Effort: Pulmonary effort is normal.     Breath sounds: Normal breath sounds. No wheezing, rhonchi or rales.   Skin:    General: Skin is warm and dry.   Neurological:     Mental Status: She is alert.   Psychiatric:        Speech: Speech normal.        Behavior: Behavior normal.        Thought Content: Thought content normal.

## 2024-01-14 NOTE — Patient Instructions (Addendum)
 Please call  and schedule your  bone density scan as we discussed.   [facility]  ( new location in 2023)  [address], [place], [place] [address]  [place], [place]  [phone]  Referral for colonoscopy.  Due December of this year.  Let us  know if you dont hear back within a week in regards to an appointment being scheduled.   So that you are aware, if you are [facility] MyChart user , please pay attention to your MyChart messages as you may receive a MyChart message with a phone number to call and schedule this test/appointment own your own from our referral coordinator. This is a new process so I do not want you to miss this message.  If you are not a MyChart user, you will receive a phone call.    Nice to meet you!

## 2024-01-14 NOTE — Assessment & Plan Note (Signed)
 Chronic, stable. No alarm features at this time.  Continue omeprazole  20 mg

## 2024-01-14 NOTE — Assessment & Plan Note (Signed)
 Previously well-controlled.  Pending lipid panel.  Continue Crestor  5 mg Monday Wednesday Friday.

## 2024-01-14 NOTE — Assessment & Plan Note (Signed)
 Reviewed prior MRI.  HA frequency improved.  Continue rescue therapy such as Tylenol .  We discussed amitriptyline  in the setting of headache prevention, chronic pain.  Of note, initially amitriptyline  was started during perimenopause . In the future can consider escalating dose if needed.  Will monitor

## 2024-01-19 ENCOUNTER — Other Ambulatory Visit

## 2024-01-19 DIAGNOSIS — R519 Headache, unspecified: Secondary | ICD-10-CM | POA: Diagnosis not present

## 2024-01-19 DIAGNOSIS — Z7689 Persons encountering health services in other specified circumstances: Secondary | ICD-10-CM | POA: Diagnosis not present

## 2024-01-19 DIAGNOSIS — E782 Mixed hyperlipidemia: Secondary | ICD-10-CM | POA: Diagnosis not present

## 2024-01-19 DIAGNOSIS — Z131 Encounter for screening for diabetes mellitus: Secondary | ICD-10-CM | POA: Diagnosis not present

## 2024-01-19 DIAGNOSIS — M8580 Other specified disorders of bone density and structure, unspecified site: Secondary | ICD-10-CM

## 2024-01-19 LAB — COMPREHENSIVE METABOLIC PANEL WITH GFR
ALT: 25 U/L (ref 0–35)
AST: 27 U/L (ref 0–37)
Albumin: 4.4 g/dL (ref 3.5–5.2)
Alkaline Phosphatase: 61 U/L (ref 39–117)
BUN: 14 mg/dL (ref 6–23)
CO2: 30 meq/L (ref 19–32)
Calcium: 9.3 mg/dL (ref 8.4–10.5)
Chloride: 105 meq/L (ref 96–112)
Creatinine, Ser: 0.74 mg/dL (ref 0.40–1.20)
GFR: 81.19 mL/min (ref >60.00)
Glucose, Bld: 99 mg/dL (ref 70–99)
Potassium: 4 meq/L (ref 3.5–5.1)
Sodium: 142 meq/L (ref 135–145)
Total Bilirubin: 0.4 mg/dL (ref 0.2–1.2)
Total Protein: 7 g/dL (ref 6.0–8.3)

## 2024-01-19 LAB — CBC WITH DIFFERENTIAL/PLATELET
Basophils Absolute: 0 10*3/uL (ref 0.0–0.1)
Basophils Relative: 0.6 % (ref 0.0–3.0)
Eosinophils Absolute: 0.3 10*3/uL (ref 0.0–0.7)
Eosinophils Relative: 4.2 % (ref 0.0–5.0)
HCT: 42.5 % (ref 36.0–46.0)
Hemoglobin: 14.3 g/dL (ref 12.0–15.0)
Lymphocytes Relative: 24.5 % (ref 12.0–46.0)
Lymphs Abs: 2.1 10*3/uL (ref 0.7–4.0)
MCHC: 33.6 g/dL (ref 30.0–36.0)
MCV: 90.5 fl (ref 78.0–100.0)
Monocytes Absolute: 0.4 10*3/uL (ref 0.1–1.0)
Monocytes Relative: 5.2 % (ref 3.0–12.0)
Neutro Abs: 5.5 10*3/uL (ref 1.4–7.7)
Neutrophils Relative %: 65.5 % (ref 43.0–77.0)
Platelets: 270 10*3/uL (ref 150.0–400.0)
RBC: 4.7 Mil/uL (ref 3.87–5.11)
RDW: 14 % (ref 11.5–15.5)
WBC: 8.4 10*3/uL (ref 4.0–10.5)

## 2024-01-19 LAB — LIPID PANEL
Cholesterol: 137 mg/dL (ref 0–200)
HDL: 36.7 mg/dL — ABNORMAL LOW (ref >39.00)
LDL Cholesterol: 70 mg/dL (ref 0–99)
NonHDL: 100.05
Total CHOL/HDL Ratio: 4
Triglycerides: 148 mg/dL (ref 0.0–149.0)
VLDL: 29.6 mg/dL (ref 0.0–40.0)

## 2024-01-19 LAB — VITAMIN D 25 HYDROXY (VIT D DEFICIENCY, FRACTURES): VITD: 93.12 ng/mL (ref 30.00–100.00)

## 2024-01-19 LAB — HEMOGLOBIN A1C: Hgb A1c MFr Bld: 5.8 % (ref 4.6–6.5)

## 2024-01-20 ENCOUNTER — Encounter: Payer: Self-pay | Admitting: Family

## 2024-01-20 NOTE — Telephone Encounter (Signed)
 Spoke to pt scheduled her to see Lorice Roof on 01/21/24

## 2024-01-21 ENCOUNTER — Ambulatory Visit: Payer: Self-pay | Admitting: Family

## 2024-01-21 ENCOUNTER — Ambulatory Visit (INDEPENDENT_AMBULATORY_CARE_PROVIDER_SITE_OTHER): Admitting: Nurse Practitioner

## 2024-01-21 ENCOUNTER — Encounter: Payer: Self-pay | Admitting: Nurse Practitioner

## 2024-01-21 VITALS — BP 110/68 | HR 94 | Temp 98.4°F | Resp 20 | Ht 63.0 in | Wt 134.4 lb

## 2024-01-21 DIAGNOSIS — R051 Acute cough: Secondary | ICD-10-CM | POA: Diagnosis not present

## 2024-01-21 MED ORDER — PREDNISONE 20 MG PO TABS: 40.0000 mg | ORAL_TABLET | Freq: Every day | ORAL | 0 refills | Status: DC

## 2024-01-21 NOTE — Progress Notes (Unsigned)
  Bluford Burkitt, NP-C Phone: [phone]  Erin [name] is a 73 y.o. female who presents today for cough.   ***  Social History   Tobacco Use  Smoking Status Never  Smokeless Tobacco Never    Current Outpatient Medications on File Prior to Visit  Medication Sig Dispense Refill   amitriptyline  (ELAVIL ) 50 MG tablet TAKE 1 TABLET BY MOUTH EVERYDAY AT BEDTIME 90 tablet 1   cholecalciferol (VITAMIN D ) 1000 UNITS tablet Take 1,000 Units by mouth daily.     levothyroxine  (SYNTHROID ) 75 MCG tablet TAKE 1 TABLET (75 MCG TOTAL) BY MOUTH DAILY BEFORE BREAKFAST. 90 tablet 3   Multiple Vitamins-Minerals (CENTRUM SILVER PO) Take by mouth.     omeprazole  (PRILOSEC) 20 MG capsule TAKE 1 CAPSULE BY MOUTH EVERY DAY 90 capsule 1   rosuvastatin  (CRESTOR ) 5 MG tablet Take 5 mg (1 tablet) by mouth on Monday, Wednesday, and Friday 36 tablet 3   scopolamine  (TRANSDERM-SCOP) 1 MG/3DAYS Place 1 patch (1.5 mg total) onto the skin every 3 (three) days. 10 patch 0   No current facility-administered medications on file prior to visit.     ROS see history of present illness  Objective  Physical Exam Vitals:   01/21/24 1542  BP: 110/68  Pulse: 94  Resp: 20  Temp: 98.4 F (36.9 C)  SpO2: 97%    BP Readings from Last 3 Encounters:  01/21/24 110/68  01/14/24 116/70  11/29/23 124/85   Wt Readings from Last 3 Encounters:  01/21/24 134 lb 6 oz (61 kg)  01/14/24 134 lb (60.8 kg)  11/29/23 130 lb (59 kg)    Physical Exam Constitutional:      General: She is not in acute distress.    Appearance: Normal appearance.  HENT:     Head: Normocephalic.     Right Ear: Tympanic membrane normal.     Left Ear: Tympanic membrane normal.     Nose: Nose normal.     Mouth/Throat:     Mouth: Mucous membranes are moist.     Pharynx: Oropharynx is clear.   Eyes:     Conjunctiva/sclera: Conjunctivae normal.     Pupils: Pupils are equal, round, and reactive to light.    Cardiovascular:     Rate and  Rhythm: Normal rate and regular rhythm.     Heart sounds: Normal heart sounds.  Pulmonary:     Effort: Pulmonary effort is normal.     Breath sounds: Normal breath sounds.  Abdominal:     General: Bowel sounds are normal.  Lymphadenopathy:     Cervical: No cervical adenopathy.   Skin:    General: Skin is warm and dry.   Neurological:     General: No focal deficit present.     Mental Status: She is alert.   Psychiatric:        Mood and Affect: Mood normal.        Behavior: Behavior normal.      Assessment/Plan: Please see individual problem list.  Acute cough -     predniSONE; Take 2 tablets (40 mg total) by mouth daily with breakfast.  Dispense: 10 tablet; Refill: 0     Health Maintenance: ***  Return if symptoms worsen or fail to improve.   Bluford Burkitt, NP-C [facility]

## 2024-01-21 NOTE — Progress Notes (Unsigned)
 q

## 2024-01-26 ENCOUNTER — Telehealth: Payer: Self-pay

## 2024-01-26 NOTE — Telephone Encounter (Signed)
 Pharmacy Patient Advocate Encounter   Received notification from CoverMyMeds that prior authorization for Scopolamine  Patch  is required/requested.   Insurance verification completed.   The patient is insured through [employer] Part D .   Per test claim: PA required; PA submitted to above mentioned insurance via CoverMyMeds Key/confirmation #/EOC [id number] Status is pending

## 2024-01-27 ENCOUNTER — Encounter: Payer: Self-pay | Admitting: Nurse Practitioner

## 2024-01-27 ENCOUNTER — Other Ambulatory Visit (HOSPITAL_COMMUNITY): Payer: Self-pay

## 2024-01-27 DIAGNOSIS — R051 Acute cough: Secondary | ICD-10-CM | POA: Insufficient documentation

## 2024-01-27 NOTE — Assessment & Plan Note (Signed)
 She has a persistent dry cough likely due to allergies or a viral infection, with GERD being less likely. Omeprazole  is already in use. Prescribe prednisone  for 5 days, taken in the morning with breakfast. Discuss potential side effects of prednisone , counseled on common side effects. Recommend daily cetirizine and continue dextromethorphan as needed. Suggest benzonatate , cough drops, and warm lemon water with honey for symptomatic relief. Return precautions given to patient.

## 2024-01-27 NOTE — Telephone Encounter (Signed)
 Pharmacy Patient Advocate Encounter  Received notification from [employer] that Prior Authorization for Scopolamine  1MG /3DAYS 72 hr patches has been APPROVED from 10/28/2023 to 01/25/2025. Unable to obtain price due to refill too soon rejection, last fill date 01/26/2024 next available fill date07/10/2023   PA #/Case ID/Reference #: [id number]

## 2024-03-09 DIAGNOSIS — E890 Postprocedural hypothyroidism: Secondary | ICD-10-CM | POA: Diagnosis not present

## 2024-03-15 ENCOUNTER — Other Ambulatory Visit: Payer: Self-pay | Admitting: Family

## 2024-03-15 DIAGNOSIS — Z1231 Encounter for screening mammogram for malignant neoplasm of breast: Secondary | ICD-10-CM

## 2024-03-16 DIAGNOSIS — E890 Postprocedural hypothyroidism: Secondary | ICD-10-CM | POA: Diagnosis not present

## 2024-03-16 DIAGNOSIS — C73 Malignant neoplasm of thyroid gland: Secondary | ICD-10-CM | POA: Diagnosis not present

## 2024-03-17 ENCOUNTER — Ambulatory Visit
Admission: RE | Admit: 2024-03-17 | Discharge: 2024-03-17 | Disposition: A | Discharge status: Home / Self Care | Source: Ambulatory Visit | Attending: Family | Admitting: Family

## 2024-03-17 DIAGNOSIS — Z7689 Persons encountering health services in other specified circumstances: Secondary | ICD-10-CM | POA: Diagnosis present

## 2024-03-17 DIAGNOSIS — Z13820 Encounter for screening for osteoporosis: Secondary | ICD-10-CM | POA: Diagnosis not present

## 2024-03-17 DIAGNOSIS — Z780 Asymptomatic menopausal state: Secondary | ICD-10-CM | POA: Diagnosis not present

## 2024-03-17 DIAGNOSIS — M8580 Other specified disorders of bone density and structure, unspecified site: Secondary | ICD-10-CM

## 2024-03-17 DIAGNOSIS — M8589 Other specified disorders of bone density and structure, multiple sites: Secondary | ICD-10-CM | POA: Diagnosis not present

## 2024-03-29 ENCOUNTER — Encounter: Payer: Self-pay | Admitting: Family

## 2024-03-29 ENCOUNTER — Other Ambulatory Visit: Payer: Self-pay | Admitting: Family

## 2024-03-30 MED ORDER — OMEPRAZOLE 40 MG PO CPDR: 40.0000 mg | DELAYED_RELEASE_CAPSULE | Freq: Every day | ORAL | 3 refills | Status: DC

## 2024-04-22 ENCOUNTER — Ambulatory Visit: Payer: Medicare HMO | Admitting: *Deleted

## 2024-04-22 VITALS — Ht 63.0 in | Wt 130.0 lb

## 2024-04-22 DIAGNOSIS — Z0000 Encounter for general adult medical examination without abnormal findings: Secondary | ICD-10-CM

## 2024-04-22 NOTE — Patient Instructions (Signed)
 Ms. Ravi,  Thank you for taking the time for your Medicare Wellness Visit. I appreciate your continued commitment to your health goals. Please review the care plan we discussed, and feel free to reach out if I can assist you further.  Medicare recommends these wellness visits once per year to help you and your care team stay ahead of potential health issues. These visits are designed to focus on prevention, allowing your provider to concentrate on managing your acute and chronic conditions during your regular appointments.  Please note that Annual Wellness Visits do not include a physical exam. Some assessments may be limited, especially if the visit was conducted virtually. If needed, we may recommend a separate in-person follow-up with your provider.  Ongoing Care Seeing your primary care provider every 3 to 6 months helps us  monitor your health and provide consistent, personalized care.  Remember to update your flu and Ms. Kniss,  Thank you for taking the time for your Medicare Wellness Visit. I appreciate your continued commitment to your health goals. Please review the care plan we discussed, and feel free to reach out if I can assist you further.  Medicare recommends these wellness visits once per year to help you and your care team stay ahead of potential health issues. These visits are designed to focus on prevention, allowing your provider to concentrate on managing your acute and chronic conditions during your regular appointments.  Please note that Annual Wellness Visits do not include a physical exam. Some assessments may be limited, especially if the visit was conducted virtually. If needed, we may recommend a separate in-person follow-up with your provider.  Ongoing Care Seeing your primary care provider every 3 to 6 months helps us  monitor your health and provide consistent, personalized care.  Remember to check your calendar and call the office to set up an appointment to  update your vaccines.   Referrals If a referral was made during today's visit and you haven't received any updates within two weeks, please contact the referred provider directly to check on the status.  Recommended Screenings:  Health Maintenance  Topic Date Due   Flu Shot  03/04/2024   Colon Cancer Screening  07/07/2024   Pneumococcal Vaccine for age over 17 (1 of 1 - PCV) 01/13/2025*   Breast Cancer Screening  07/19/2024   Medicare Annual Wellness Visit  04/22/2025   DTaP/Tdap/Td vaccine (3 - Td or Tdap) 01/15/2030   DEXA scan (bone density measurement)  Completed   HPV Vaccine  Aged Out   Meningitis B Vaccine  Aged Out   COVID-19 Vaccine  Discontinued   Hepatitis C Screening  Discontinued   Zoster (Shingles) Vaccine  Discontinued  *Topic was postponed. The date shown is not the original due date.       04/22/2024   11:02 AM  Advanced Directives  Does Patient Have a Medical Advance Directive? Yes  Type of Estate agent of [place];Living will  Copy of Healthcare Power of Attorney in Chart? No - copy requested   Advance Care Planning is important because it: Ensures you receive medical care that aligns with your values, goals, and preferences. Provides guidance to your family and loved ones, reducing the emotional burden of decision-making during critical moments.  Vision: Annual vision screenings are recommended for early detection of glaucoma, cataracts, and diabetic retinopathy. These exams can also reveal signs of chronic conditions such as diabetes and high blood pressure.  Dental: Annual dental screenings help detect early signs of  oral cancer, gum disease, and other conditions linked to overall health, including heart disease and diabetes.  Please see the attached documents for additional preventive care recommendations.    Referrals If a referral was made during today's visit and you haven't received any updates within two weeks, please contact  the referred provider directly to check on the status.  Recommended Screenings:  Health Maintenance  Topic Date Due   Flu Shot  03/04/2024   Colon Cancer Screening  07/07/2024   Pneumococcal Vaccine for age over 20 (1 of 1 - PCV) 01/13/2025*   Breast Cancer Screening  07/19/2024   Medicare Annual Wellness Visit  04/22/2025   DTaP/Tdap/Td vaccine (3 - Td or Tdap) 01/15/2030   DEXA scan (bone density measurement)  Completed   HPV Vaccine  Aged Out   Meningitis B Vaccine  Aged Out   COVID-19 Vaccine  Discontinued   Hepatitis C Screening  Discontinued   Zoster (Shingles) Vaccine  Discontinued  *Topic was postponed. The date shown is not the original due date.       04/22/2024   11:02 AM  Advanced Directives  Does Patient Have a Medical Advance Directive? Yes  Type of Estate agent of [place];Living will  Copy of Healthcare Power of Attorney in Chart? No - copy requested   Advance Care Planning is important because it: Ensures you receive medical care that aligns with your values, goals, and preferences. Provides guidance to your family and loved ones, reducing the emotional burden of decision-making during critical moments.  Vision: Annual vision screenings are recommended for early detection of glaucoma, cataracts, and diabetic retinopathy. These exams can also reveal signs of chronic conditions such as diabetes and high blood pressure.  Dental: Annual dental screenings help detect early signs of oral cancer, gum disease, and other conditions linked to overall health, including heart disease and diabetes.  Please see the attached documents for additional preventive care recommendations.

## 2024-04-22 NOTE — Progress Notes (Signed)
 Subjective:   Erin [name] is a 73 y.o. who presents for a Medicare Wellness preventive visit.  As a reminder, Annual Wellness Visits don't include a physical exam, and some assessments may be limited, especially if this visit is performed virtually. We may recommend an in-person follow-up visit with your provider if needed.  Visit Complete: Virtual I connected with  Erin [name] on 04/22/24 by a audio enabled telemedicine application and verified that I am speaking with the correct person using two identifiers.  Patient Location: Home  Provider Location: Home Office  I discussed the limitations of evaluation and management by telemedicine. The patient expressed understanding and agreed to proceed.  Vital Signs: Because this visit was a virtual/telehealth visit, some criteria may be missing or patient reported. Any vitals not documented were not able to be obtained and vitals that have been documented are patient reported.  VideoDeclined- This patient declined Librarian, academic. Therefore the visit was completed with audio only.  Persons Participating in Visit: Patient.  AWV Questionnaire: Yes: Patient Medicare AWV questionnaire was completed by the patient on 04/15/24; I have confirmed that all information answered by patient is correct and no changes since this date.  Cardiac Risk Factors include: advanced age (>52men, >40 women);dyslipidemia     Objective:    Today's Vitals   04/22/24 1047 04/22/24 1048  Weight: 130 lb (59 kg)   Height: 5' 3 (1.6 m)   PainSc:  3    Body mass index is 23.03 kg/m.     04/22/2024   11:02 AM 11/29/2023    8:35 AM 04/21/2023   10:29 AM 04/15/2022    8:21 AM 03/07/2021   11:42 AM 03/06/2020    9:24 AM 12/20/2019   10:53 AM  Advanced Directives  Does Patient Have a Medical Advance Directive? Yes Yes Yes Yes Yes Yes Yes  Type of Estate agent of [place];Living will Healthcare Power of  [place];Living will Healthcare Power of [place];Living will Healthcare Power of [place];Living will Healthcare Power of [place];Living will Healthcare Power of [place];Living will Healthcare Power of [place];Living will  Does patient want to make changes to medical advance directive?    No - Patient declined No - Patient declined No - Patient declined   Copy of Healthcare Power of Attorney in Chart? No - copy requested  No - copy requested No - copy requested No - copy requested No - copy requested No - copy requested    Current Medications (verified) Outpatient Encounter Medications as of 04/22/2024  Medication Sig   amitriptyline  (ELAVIL ) 50 MG tablet TAKE 1 TABLET BY MOUTH EVERYDAY AT BEDTIME   cholecalciferol (VITAMIN D ) 1000 UNITS tablet Take 1,000 Units by mouth daily.   levothyroxine  (SYNTHROID ) 75 MCG tablet TAKE 1 TABLET (75 MCG TOTAL) BY MOUTH DAILY BEFORE BREAKFAST.   Multiple Vitamins-Minerals (CENTRUM SILVER PO) Take by mouth.   omeprazole  (PRILOSEC) 40 MG capsule Take 1 capsule (40 mg total) by mouth daily.   rosuvastatin  (CRESTOR ) 5 MG tablet Take 5 mg (1 tablet) by mouth on Monday, Wednesday, and Friday   [DISCONTINUED] predniSONE  (DELTASONE ) 20 MG tablet Take 2 tablets (40 mg total) by mouth daily with breakfast.   [DISCONTINUED] scopolamine  (TRANSDERM-SCOP) 1 MG/3DAYS Place 1 patch (1.5 mg total) onto the skin every 3 (three) days.   No facility-administered encounter medications on file as of 04/22/2024.    Allergies (verified) Amoxicillin, Atorvastatin , and Codeine   History: Past Medical History:  Diagnosis Date  Cancer [facility]) 05/23/10   thyroid , [name]   Cataract May 23, 2020   GERD (gastroesophageal reflux disease) Several yrs   History of basal cell carcinoma    several , arm,left ear, right neck, back ; follows with [name]   Hyperlipidemia    Hx   Hypertension 06/2019   PONV (postoperative nausea and vomiting)    Thyroid  disease Don't know date maybe 2010/05/23    Ulcer Don't know   Past Surgical History:  Procedure Laterality Date   CATARACT EXTRACTION W/PHACO Right 11/29/2019   Procedure: CATARACT EXTRACTION PHACO AND INTRAOCULAR LENS PLACEMENT (IOC) RIGHT VIVITY TORIC LENS 8.80 00:45.2 ;  Surgeon: [name], MD;  Location: [facility];  Service: Ophthalmology;  Laterality: Right;   CATARACT EXTRACTION W/PHACO Left 12/20/2019   Procedure: CATARACT EXTRACTION PHACO AND INTRAOCULAR LENS PLACEMENT (IOC) LEFT VIVITY TORIC LENS 7.51  00:43.7;  Surgeon: [name], MD;  Location: [facility];  Service: Ophthalmology;  Laterality: Left;   CHOLECYSTECTOMY  Don't know date   COLONOSCOPY WITH PROPOFOL  N/A 12/23/2016   Procedure: COLONOSCOPY WITH PROPOFOL ;  Surgeon: [name], MD;  Location: [facility];  Service: Endoscopy;  Laterality: N/A;   EYE SURGERY  23-May-2020   GALLBLADDER SURGERY  05/24/99   KNEE SURGERY  2003/ 2009-05-23   right knee x2 Dr. [name] for torn meniscus   SHOULDER SURGERY  1990   right, arthritis   THYROIDECTOMY  05-23-10   right side   Family History  Problem Relation Age of Onset   Stroke Mother    Heart disease Father    Heart attack Father 20   Cancer Maternal Grandmother        pancreatic   Breast cancer Neg Hx    Social History   Socioeconomic History   Marital status: Single    Spouse name: Not on file   Number of children: Not on file   Years of education: Not on file   Highest education level: 12th grade  Occupational History   Not on file  Tobacco Use   Smoking status: Never   Smokeless tobacco: Never  Vaping Use   Vaping status: Never Used  Substance and Sexual Activity   Alcohol use: Yes    Alcohol/week: 0.0 standard drinks of alcohol    Comment: socially (1x/week)   Drug use: No   Sexual activity: Never    Partners: Female  Other Topics Concern   Not on file  Social History Narrative   Lives in [place].   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       Social Drivers of Corporate investment banker Strain: Low Risk  (04/15/2024)   Overall Financial Resource Strain (CARDIA)    Difficulty of Paying Living Expenses: Not hard at all  Food Insecurity: No Food Insecurity (04/15/2024)   Hunger Vital Sign    Worried About Running Out of Food in the Last Year: Never true    Ran Out of Food in the Last Year: Never true  Transportation Needs: No Transportation Needs (04/15/2024)   PRAPARE - Administrator, Civil Service (Medical): No    Lack of Transportation (Non-Medical): No  Physical Activity: Sufficiently Active (04/15/2024)   Exercise  Vital Sign    Days of Exercise per Week: 5 days    Minutes of Exercise per Session: 40 min  Stress: No Stress Concern Present (04/15/2024)   [employer] of Occupational Health - Occupational Stress Questionnaire    Feeling of Stress: Only a little  Social Connections: Moderately Isolated (04/15/2024)   Social Connection and Isolation Panel    Frequency of Communication with Friends and Family: Twice a week    Frequency of Social Gatherings with Friends and Family: Three times a week    Attends Religious Services: Never    Active Member of Clubs or Organizations: No    Attends Engineer, structural: Never    Marital Status: Living with partner    Tobacco Counseling Counseling given: Not Answered    Clinical Intake:  Pre-visit preparation completed: Yes  Pain : 0-10 Pain Score: 3  Pain Location: Knee Pain Orientation: Right Pain Descriptors / Indicators: Aching Pain Onset: More than a month ago Pain Frequency: Intermittent     BMI - recorded: 23.03 Nutritional Status: BMI of 19-24  Normal Nutritional Risks: None Diabetes: No  Lab Results  Component Value Date   HGBA1C 5.8 01/19/2024   HGBA1C  5.5 10/22/2012   HGBA1C 5.9 11/25/2011     How often do you need to have someone help you when you read instructions, pamphlets, or other written materials from your doctor or pharmacy?: 1 - Never  Interpreter Needed?: No  Information entered by :: R. Okley Magnussen LPN   Activities of Daily Living     04/15/2024    9:44 AM  In your present state of health, do you have any difficulty performing the following activities:  Hearing? 0  Vision? 0  Difficulty concentrating or making decisions? 0  Walking or climbing stairs? 0  Dressing or bathing? 0  Doing errands, shopping? 0  Preparing Food and eating ? N  Using the Toilet? N  In the past six months, have you accidently leaked urine? N  Do you have problems with loss of bowel control? N  Managing your Medications? N  Managing your Finances? N  Housekeeping or managing your Housekeeping? N    Patient Care Team: [name], FNP as PCP - General (Family Medicine) Brendia Calton Squires, [place] (Endocrinology)  I have updated your Care Teams any recent Medical Services you may have received from other providers in the past year.     Assessment:   This is a routine wellness examination for [place].  Hearing/Vision screen Hearing Screening - Comments:: No issues Vision Screening - Comments:: glasses   Goals Addressed             This Visit's Progress    Patient Stated       Wants to continue to stay active       Depression Screen     04/22/2024   10:57 AM 01/21/2024    3:47 PM 01/14/2024    1:51 PM 06/15/2023   11:21 AM 04/21/2023   10:23 AM 04/02/2023    2:08 PM 03/09/2023    1:29 PM  PHQ 2/9 Scores  PHQ - 2 Score 0 0 0 0 0 0 0  PHQ- 9 Score 1 0  1 1 0 1    Fall Risk     04/15/2024    9:44 AM 01/21/2024    3:47 PM 01/14/2024    1:51 PM 06/15/2023   11:21 AM 04/21/2023   10:19 AM  Fall Risk   Falls  in the past year? 0 0 0 0 0  Number falls in past yr: 0 0 0 0 0  Injury with Fall? 0 0 0 0 0  Risk for  fall due to : No Fall Risks No Fall Risks No Fall Risks No Fall Risks No Fall Risks  Follow up Falls evaluation completed;Falls prevention discussed Falls evaluation completed;Education provided Falls evaluation completed Falls evaluation completed Falls prevention discussed;Falls evaluation completed    MEDICARE RISK AT HOME:  Medicare Risk at Home Any stairs in or around the home?: Yes If so, are there any without handrails?: (Patient-Rptd) No Home free of loose throw rugs in walkways, pet beds, electrical cords, etc?: (Patient-Rptd) Yes Adequate lighting in your home to reduce risk of falls?: (Patient-Rptd) Yes Life alert?: (Patient-Rptd) No Use of a cane, walker or w/c?: (Patient-Rptd) No Grab bars in the bathroom?: (Patient-Rptd) Yes Shower chair or bench in shower?: (Patient-Rptd) Yes Elevated toilet seat or a handicapped toilet?: (Patient-Rptd) Yes  TIMED UP AND GO:  Was the test performed?  No  Cognitive Function: 6CIT completed    03/06/2020    9:27 AM  MMSE - Mini Mental State Exam  Not completed: Unable to complete        04/22/2024   11:03 AM 04/21/2023   10:30 AM 04/15/2022    8:25 AM 03/04/2019    9:13 AM  6CIT Screen  What Year? 0 points 0 points 0 points 0 points  What month? 0 points 0 points 0 points 0 points  What time? 0 points 0 points 0 points 0 points  Count back from 20 0 points 0 points 0 points 0 points  Months in reverse 0 points 0 points 0 points 0 points  Repeat phrase 0 points 0 points 0 points 0 points  Total Score 0 points 0 points 0 points 0 points    Immunizations Immunization History  Administered Date(s) Administered   Fluad Quad(high Dose 65+) 05/12/2019, 06/25/2020, 06/18/2021, 05/27/2022   Fluad Trivalent(High Dose 65+) 06/15/2023   INFLUENZA, HIGH DOSE SEASONAL PF 05/28/2017, 06/07/2018   Influenza-Unspecified 04/20/2013, 05/28/2015   PFIZER(Purple Top)SARS-COV-2 Vaccination 03/05/2020, 03/26/2020   Td 01/16/2020   Tdap 12/04/2005     Screening Tests Health Maintenance  Topic Date Due   Influenza Vaccine  03/04/2024   Medicare Annual Wellness (AWV)  04/20/2024   Colonoscopy  07/07/2024   Pneumococcal Vaccine: 50+ Years (1 of 1 - PCV) 01/13/2025 (Originally 04/06/2002)   Mammogram  07/19/2024   DTaP/Tdap/Td (3 - Td or Tdap) 01/15/2030   DEXA SCAN  Completed   HPV VACCINES  Aged Out   Meningococcal B Vaccine  Aged Out   COVID-19 Vaccine  Discontinued   Hepatitis C Screening  Discontinued   Zoster Vaccines- Shingrix  Discontinued    Health Maintenance Items Addressed: Discussed the need to update flu and pneumonia vaccines. Patient stated that she is scheduled 08/2024 for a consult to have a colonoscopy. Patient declines shingles vaccines  Additional Screening:  Vision Screening: Recommended annual ophthalmology exams for early detection of glaucoma and other disorders of the eye. Is the patient up to date with their annual eye exam?  Yes  Who is the provider or what is the name of the office in which the patient attends annual eye exams? [facility]  Dental Screening: Recommended annual dental exams for proper oral hygiene  Community Resource Referral / Chronic Care Management: CRR required this visit?  No   CCM required this visit?  No  Plan:    I have personally reviewed and noted the following in the patient's chart:   Medical and social history Use of alcohol, tobacco or illicit drugs  Current medications and supplements including opioid prescriptions. Patient is not currently taking opioid prescriptions. Functional ability and status Nutritional status Physical activity Advanced directives List of other physicians Hospitalizations, surgeries, and ER visits in previous 12 months Vitals Screenings to include cognitive, depression, and falls Referrals and appointments  In addition, I have reviewed and discussed with patient certain preventive protocols, quality metrics, and best practice  recommendations. A written personalized care plan for preventive services as well as general preventive health recommendations were provided to patient.   Angeline Fredericks, LPN   [id number]   After Visit Summary: (MyChart) Due to this being a telephonic visit, the after visit summary with patients personalized plan was offered to patient via MyChart   Notes: Nothing significant to report at this time.

## 2024-05-06 ENCOUNTER — Encounter: Payer: Self-pay | Admitting: Family

## 2024-05-06 NOTE — Telephone Encounter (Signed)
 Spoke to pt scheduled her an appt with Charan for 05/10/24

## 2024-05-09 NOTE — Telephone Encounter (Signed)
 Spoke to pt she states that she is not having any sob, cp, ha at this time, pt states that she is ok with waiting to see Charan on 05/10/24

## 2024-05-10 ENCOUNTER — Encounter: Payer: Self-pay | Admitting: Nurse Practitioner

## 2024-05-10 ENCOUNTER — Ambulatory Visit: Admitting: Nurse Practitioner

## 2024-05-10 VITALS — BP 120/76 | HR 81 | Temp 98.1°F | Ht 63.0 in | Wt 130.8 lb

## 2024-05-10 DIAGNOSIS — R42 Dizziness and giddiness: Secondary | ICD-10-CM | POA: Diagnosis not present

## 2024-05-10 DIAGNOSIS — Z23 Encounter for immunization: Secondary | ICD-10-CM | POA: Diagnosis not present

## 2024-05-10 LAB — CBC WITH DIFFERENTIAL/PLATELET
Basophils Absolute: 0.1 K/uL (ref 0.0–0.1)
Basophils Relative: 0.6 % (ref 0.0–3.0)
Eosinophils Absolute: 0.4 K/uL (ref 0.0–0.7)
Eosinophils Relative: 3.8 % (ref 0.0–5.0)
HCT: 42.3 % (ref 36.0–46.0)
Hemoglobin: 14.3 g/dL (ref 12.0–15.0)
Lymphocytes Relative: 22.6 % (ref 12.0–46.0)
Lymphs Abs: 2.3 K/uL (ref 0.7–4.0)
MCHC: 33.8 g/dL (ref 30.0–36.0)
MCV: 90.6 fl (ref 78.0–100.0)
Monocytes Absolute: 0.6 K/uL (ref 0.1–1.0)
Monocytes Relative: 5.9 % (ref 3.0–12.0)
Neutro Abs: 6.8 K/uL (ref 1.4–7.7)
Neutrophils Relative %: 67.1 % (ref 43.0–77.0)
Platelets: 270 K/uL (ref 150.0–400.0)
RBC: 4.66 Mil/uL (ref 3.87–5.11)
RDW: 13.6 % (ref 11.5–15.5)
WBC: 10.1 K/uL (ref 4.0–10.5)

## 2024-05-10 NOTE — Progress Notes (Signed)
 Established Patient Office Visit  Subjective:  Patient ID: Erin [name], female    DOB: [birth year]  Age: 73 y.o. MRN: [id number]  CC:  Chief Complaint  Patient presents with   Acute Visit    Last week while push mowing Patient had an episode of really tired, extra fast heartbeats, a little lightheaded and a little winded Erin [name] went in and got some water to sit and take a break and got Hot like a Hot flash   Discussed the use of a AI scribe software for clinical note transcription with the patient, who gave verbal consent to proceed.  HPI Erin [name] is a 73 year old female who presents with recent episodes of fatigue and palpitations during physical activity.  Last week, while mowing the yard, Erin [name] experienced significant fatigue and palpitations, describing it as her heart 'beating out of my chest'. This episode occurred after blowing leaves and then mowing without taking breaks. Erin [name] sat down, drank water and Powerade, and felt better, completing the task with a riding lawnmower. Since then, Erin [name] has felt fine with no recurrence of symptoms.  Erin [name] maintains a routine of walking 30-45 minutes daily, except on days when Erin [name] does yard work. Her pulse tends to run high, but her blood pressure has been stable, often running low. Erin [name] previously took blood pressure medication but no longer requires it.  Her current medications include levothyroxine , omeprazole  40 mg, Crestor , and amitriptyline . Erin [name] has read about omeprazole 's potential to lower magnesium levels.  Erin [name] experiences occasional hot flashes, particularly when anxious or stressed. No ongoing fatigue, shortness of breath, or chest pain since the initial episode. Erin [name] also reports occasional neck problems and headaches, and has degenerative disc disease in her back.  HPI   Past Medical History:  Diagnosis Date   Cancer [facility]) 2011   thyroid , [name]   Cataract 2021   GERD (gastroesophageal reflux disease) Several yrs   History of  basal cell carcinoma    several , arm,left ear, right neck, back ; follows with [name]   Hyperlipidemia    Hx   Hypertension 06/2019   PONV (postoperative nausea and vomiting)    Thyroid  disease Don't know date maybe 2011   Ulcer Don't know    Past Surgical History:  Procedure Laterality Date   CATARACT EXTRACTION W/PHACO Right 11/29/2019   Procedure: CATARACT EXTRACTION PHACO AND INTRAOCULAR LENS PLACEMENT (IOC) RIGHT VIVITY TORIC LENS 8.80 00:45.2 ;  Surgeon: [name], MD;  Location: [facility];  Service: Ophthalmology;  Laterality: Right;   CATARACT EXTRACTION W/PHACO Left 12/20/2019   Procedure: CATARACT EXTRACTION PHACO AND INTRAOCULAR LENS PLACEMENT (IOC) LEFT VIVITY TORIC LENS 7.51  00:43.7;  Surgeon: [name], MD;  Location: [facility];  Service: Ophthalmology;  Laterality: Left;   CHOLECYSTECTOMY  Don't know date   COLONOSCOPY WITH PROPOFOL  N/A 12/23/2016   Procedure: COLONOSCOPY WITH PROPOFOL ;  Surgeon: [name], MD;  Location: [facility];  Service: Endoscopy;  Laterality: N/A;   EYE SURGERY  2021   GALLBLADDER SURGERY  2000   KNEE SURGERY  2003/ 2010   right knee x2 Dr. [name] for torn meniscus   SHOULDER SURGERY  1990   right, arthritis   THYROIDECTOMY  2011   right side    Family History  Problem Relation Age of Onset   Stroke Mother    Heart disease Father    Heart attack Father 76   Cancer Maternal Grandmother  pancreatic   Breast cancer Neg Hx     Social History   Socioeconomic History   Marital status: Single    Spouse name: Not on file   Number of children: Not on file   Years of education: Not on file   Highest education level: 12th grade  Occupational History   Not on file  Tobacco Use   Smoking status: Never   Smokeless tobacco: Never  Vaping Use   Vaping status: Never Used  Substance and Sexual Activity   Alcohol use: Yes    Alcohol/week: 0.0 standard drinks of alcohol    Comment:  socially (1x/week)   Drug use: No   Sexual activity: Never    Partners: Female  Other Topics Concern   Not on file  Social History Narrative   Lives in [place].   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       Social Drivers of Corporate investment banker Strain: Low Risk  (04/15/2024)   Overall Financial Resource Strain (CARDIA)    Difficulty of Paying Living Expenses: Not hard at all  Food Insecurity: No Food Insecurity (04/15/2024)   Hunger Vital Sign    Worried About Running Out of Food in the Last Year: Never true    Ran Out of Food in the Last Year: Never true  Transportation Needs: No Transportation Needs (04/15/2024)   PRAPARE - Administrator, Civil Service (Medical): No    Lack of Transportation (Non-Medical): No  Physical Activity: Sufficiently Active (04/15/2024)   Exercise Vital Sign    Days of Exercise per Week: 5 days    Minutes of Exercise per Session: 40 min  Stress: No Stress Concern Present (04/15/2024)   [employer] of Occupational Health - Occupational Stress Questionnaire    Feeling of Stress: Only a little  Social Connections: Moderately Isolated (04/15/2024)   Social Connection and Isolation Panel    Frequency of Communication with Friends and Family: Twice a week    Frequency of Social Gatherings with Friends and Family: Three times a week    Attends Religious Services: Never    Active Member of Clubs or Organizations: No    Attends Banker Meetings: Never    Marital Status: Living with partner  Intimate Partner Violence: Not At Risk (04/22/2024)   Humiliation, Afraid, Rape, and Kick questionnaire    Fear of Current or Ex-Partner: No    Emotionally Abused: No    Physically Abused: No    Sexually Abused: No     Outpatient  Medications Prior to Visit  Medication Sig Dispense Refill   amitriptyline  (ELAVIL ) 50 MG tablet TAKE 1 TABLET BY MOUTH EVERYDAY AT BEDTIME 90 tablet 1   cholecalciferol (VITAMIN D ) 1000 UNITS tablet Take 1,000 Units by mouth daily.     levothyroxine  (SYNTHROID ) 75 MCG tablet TAKE 1 TABLET (75 MCG TOTAL) BY MOUTH DAILY BEFORE BREAKFAST. 90 tablet 3   Multiple Vitamins-Minerals (CENTRUM SILVER PO) Take by mouth.     omeprazole  (PRILOSEC) 40 MG capsule Take 1 capsule (40 mg total) by mouth daily. 30 capsule 3   rosuvastatin  (CRESTOR ) 5 MG tablet Take 5 mg (1 tablet) by mouth on Monday, Wednesday, and Friday 36 tablet  3   No facility-administered medications prior to visit.    Allergies  Allergen Reactions   Amoxicillin Hives   Atorvastatin      Joint pain Other reaction(s): Muscle Pain   Codeine Nausea Only    weakness    ROS Review of Systems Negative unless indicated in HPI.    Objective:    Physical Exam Constitutional:      Appearance: Normal appearance.  HENT:     Mouth/Throat:     Mouth: Mucous membranes are moist.  Eyes:     Conjunctiva/sclera: Conjunctivae normal.     Pupils: Pupils are equal, round, and reactive to light.  Cardiovascular:     Rate and Rhythm: Normal rate and regular rhythm.     Pulses: Normal pulses.     Heart sounds: Normal heart sounds.  Pulmonary:     Effort: Pulmonary effort is normal.     Breath sounds: Normal breath sounds.  Abdominal:     General: Bowel sounds are normal.     Palpations: Abdomen is soft.  Musculoskeletal:     Cervical back: Normal range of motion. No tenderness.  Skin:    General: Skin is warm.     Findings: No bruising.  Neurological:     General: No focal deficit present.     Mental Status: Erin [name] is alert and oriented to person, place, and time. Mental status is at baseline.  Psychiatric:        Mood and Affect: Mood normal.        Behavior: Behavior normal.        Thought Content: Thought content normal.         Judgment: Judgment normal.     BP 120/76   Pulse 81   Temp 98.1 F (36.7 C)   Ht 5' 3 (1.6 m)   Wt 130 lb 12.8 oz (59.3 kg)   SpO2 95%   BMI 23.17 kg/m  Wt Readings from Last 3 Encounters:  05/10/24 130 lb 12.8 oz (59.3 kg)  04/22/24 130 lb (59 kg)  01/21/24 134 lb 6 oz (61 kg)     Health Maintenance  Topic Date Due   Colonoscopy  07/07/2024   Mammogram  07/19/2024   Pneumococcal Vaccine: 50+ Years (1 of 1 - PCV) 01/13/2025 (Originally 04/06/2002)   Medicare Annual Wellness (AWV)  04/22/2025   DTaP/Tdap/Td (4 - Td or Tdap) 01/15/2030   Influenza Vaccine  Completed   DEXA SCAN  Completed   Meningococcal B Vaccine  Aged Out   COVID-19 Vaccine  Discontinued   Hepatitis C Screening  Discontinued   Zoster Vaccines- Shingrix  Discontinued    There are no preventive care reminders to display for this patient.  Lab Results  Component Value Date   TSH 1.10 03/09/2023   Lab Results  Component Value Date   WBC 10.1 05/10/2024   HGB 14.3 05/10/2024   HCT 42.3 05/10/2024   MCV 90.6 05/10/2024   PLT 270.0 05/10/2024   Lab Results  Component Value Date   NA 142 05/10/2024   K 4.2 05/10/2024   CO2 27 05/10/2024   GLUCOSE 108 (H) 05/10/2024   BUN 19 05/10/2024   CREATININE 0.74 05/10/2024   BILITOT 0.3 05/10/2024   ALKPHOS 61 01/19/2024   AST 23 05/10/2024   ALT 22 05/10/2024   PROT 6.9 05/10/2024   ALBUMIN 4.4 01/19/2024   CALCIUM  9.7 05/10/2024   ANIONGAP 10 12/23/2021   GFR 81.19 01/19/2024   Lab Results  Component Value  Date   CHOL 137 01/19/2024   Lab Results  Component Value Date   HDL 36.70 (L) 01/19/2024   Lab Results  Component Value Date   LDLCALC 70 01/19/2024   Lab Results  Component Value Date   TRIG 148.0 01/19/2024   Lab Results  Component Value Date   CHOLHDL 4 01/19/2024   Lab Results  Component Value Date   HGBA1C 5.8 01/19/2024      Assessment & Plan:  Feeling light headed Assessment & Plan: Currently asymptomatic after  episode of fatigue and palpitations.  Not orthostatic. Blood pressure controlled. Heart and lung exam normal. - Administer flu shot. - Order labs for liver and kidney function. - Discussed importance of hydration during outdoor activities. -Erin [name] will let us  know if symptoms reoccur.   Orders: -     CBC with Differential/Platelet -     COMPLETE METABOLIC PANEL WITHOUT GFR -     Magnesium; Future  Need for immunization against influenza -     Flu vaccine HIGH DOSE PF(Fluzone Trivalent)  Other orders -     Magnesium    Follow-up: No follow-ups on file.   Marnell Mcdaniel, NP

## 2024-05-11 ENCOUNTER — Other Ambulatory Visit

## 2024-05-11 ENCOUNTER — Encounter: Payer: Self-pay | Admitting: Nurse Practitioner

## 2024-05-11 ENCOUNTER — Ambulatory Visit: Payer: Self-pay | Admitting: Nurse Practitioner

## 2024-05-11 DIAGNOSIS — R42 Dizziness and giddiness: Secondary | ICD-10-CM

## 2024-05-11 NOTE — Progress Notes (Signed)
 Please add magnesium to the labs drawn yesterday.

## 2024-05-12 ENCOUNTER — Encounter: Payer: Self-pay | Admitting: Nurse Practitioner

## 2024-05-12 LAB — COMPLETE METABOLIC PANEL WITHOUT GFR
AG Ratio: 1.9 (calc) (ref 1.0–2.5)
ALT: 22 U/L (ref 6–29)
AST: 23 U/L (ref 10–35)
Albumin: 4.5 g/dL (ref 3.6–5.1)
Alkaline phosphatase (APISO): 66 U/L (ref 37–153)
BUN: 19 mg/dL (ref 7–25)
CO2: 27 mmol/L (ref 20–32)
Calcium: 9.7 mg/dL (ref 8.6–10.4)
Chloride: 105 mmol/L (ref 98–110)
Creat: 0.74 mg/dL (ref 0.60–1.00)
Globulin: 2.4 g/dL (ref 1.9–3.7)
Glucose, Bld: 108 mg/dL — ABNORMAL HIGH (ref 65–99)
Potassium: 4.2 mmol/L (ref 3.5–5.3)
Sodium: 142 mmol/L (ref 135–146)
Total Bilirubin: 0.3 mg/dL (ref 0.2–1.2)
Total Protein: 6.9 g/dL (ref 6.1–8.1)

## 2024-05-12 LAB — MAGNESIUM: Magnesium: 2.3 mg/dL (ref 1.5–2.5)

## 2024-05-12 NOTE — Telephone Encounter (Signed)
 Please call pt and check how is she doing now and what are her symptoms?

## 2024-05-12 NOTE — Telephone Encounter (Signed)
 Left message to call the office or send a my chart message to let us  know how she is doing and what her symptoms are?

## 2024-05-13 NOTE — Telephone Encounter (Signed)
 Please inform pt if there symptoms are not improving or worsening she needs to get evaluated.

## 2024-05-23 DIAGNOSIS — R42 Dizziness and giddiness: Secondary | ICD-10-CM | POA: Insufficient documentation

## 2024-05-23 NOTE — Assessment & Plan Note (Signed)
 Currently asymptomatic after episode of fatigue and palpitations.  Not orthostatic. Blood pressure controlled. Heart and lung exam normal. - Administer flu shot. - Order labs for liver and kidney function. - Discussed importance of hydration during outdoor activities. -She will let us  know if symptoms reoccur.

## 2024-07-13 ENCOUNTER — Encounter: Payer: Self-pay | Admitting: Family

## 2024-07-15 ENCOUNTER — Ambulatory Visit: Admitting: Family

## 2024-07-15 ENCOUNTER — Encounter: Payer: Self-pay | Admitting: Family

## 2024-07-15 VITALS — BP 118/74 | HR 86 | Temp 97.9°F | Ht 63.0 in | Wt 131.0 lb

## 2024-07-15 DIAGNOSIS — K219 Gastro-esophageal reflux disease without esophagitis: Secondary | ICD-10-CM

## 2024-07-15 DIAGNOSIS — F4323 Adjustment disorder with mixed anxiety and depressed mood: Secondary | ICD-10-CM | POA: Diagnosis not present

## 2024-07-15 DIAGNOSIS — E782 Mixed hyperlipidemia: Secondary | ICD-10-CM

## 2024-07-15 MED ORDER — AMITRIPTYLINE HCL 50 MG PO TABS: ORAL_TABLET | ORAL | 3 refills | Status: AC

## 2024-07-15 MED ORDER — OMEPRAZOLE 20 MG PO CPDR: 20.0000 mg | DELAYED_RELEASE_CAPSULE | Freq: Every day | ORAL | 3 refills | Status: AC

## 2024-07-15 NOTE — Progress Notes (Signed)
 Assessment & Plan:  Gastroesophageal reflux disease, unspecified whether esophagitis present Assessment & Plan: Chronic, stable.  Symptoms controlled with Prilosec 40 mg daily.  Discussed long-term use of Prilosec and advocated for trial decrease to 20 mg.  Upcoming GI consult.  Patient may require EGD due to longstanding GERD  Orders: -     Omeprazole ; Take 1 capsule (20 mg total) by mouth daily.  Dispense: 30 capsule; Refill: 3  Mixed hyperlipidemia Assessment & Plan: Chronic, stable.  Continue Crestor  5 mg Monday Wednesday Friday.   Adjustment disorder with mixed anxiety and depressed mood Assessment & Plan: Chronic, stable.  Continue amitriptyline  50 mg nightly.  PHQ score of 1   Other orders -     Amitriptyline  HCl; TAKE 1 TABLET BY MOUTH EVERYDAY AT BEDTIME  Dispense: 90 tablet; Refill: 3     Return precautions given.   Risks, benefits, and alternatives of the medications and treatment plan prescribed today were discussed, and patient expressed understanding.   Education regarding symptom management and diagnosis given to patient on AVS either electronically or printed.  Return in about 6 months (around 01/13/2025).  Rollene Northern, FNP  Subjective:    Patient ID: Erin [name], female    DOB: 29-Jun-1951, 73 y.o.   MRN: [id number]  CC: Erin [name] is a 73 y.o. female who presents today for follow up.   HPI: She feels well today No new complaints  She has now separated synthroid  and prilosec 40mg  by  4 hours.   Epigastric burning is controlled.   No pain with swallowing, trouble swallowing.  Weight is stable.      Compliant with crestor  MWF  She is compliant with amitriptyline  50mg .    GI consult 08/2024   Last EGD 2007, esophagitis present.  Unable to review pathology in epic.  Allergies: Amoxicillin, Atorvastatin , and Codeine Medications Ordered Prior to Encounter[1]  Review of Systems  Constitutional:  Negative for chills and fever.   Respiratory:  Negative for cough.   Cardiovascular:  Negative for chest pain and palpitations.  Gastrointestinal:  Negative for nausea and vomiting.      Objective:    BP 118/74   Pulse 86   Temp 97.9 F (36.6 C)   Ht 5' 3 (1.6 m)   Wt 131 lb (59.4 kg)   SpO2 96%   BMI 23.21 kg/m  BP Readings from Last 3 Encounters:  07/15/24 118/74  05/10/24 120/76  01/21/24 110/68   Wt Readings from Last 3 Encounters:  07/15/24 131 lb (59.4 kg)  05/10/24 130 lb 12.8 oz (59.3 kg)  04/22/24 130 lb (59 kg)      05/10/2024    1:57 PM 04/22/2024   10:57 AM 01/21/2024    3:47 PM  Depression screen PHQ 2/9  Decreased Interest 0 0 0  Down, Depressed, Hopeless 0 0 0  PHQ - 2 Score 0 0 0  Altered sleeping 1 1 0  Tired, decreased energy 0 0 0  Change in appetite 0 0 0  Feeling bad or failure about yourself  0 0 0  Trouble concentrating 0 0 0  Moving slowly or fidgety/restless 0 0 0  Suicidal thoughts 0 0 0  PHQ-9 Score 1  1  0   Difficult doing work/chores Not difficult at all Not difficult at all Not difficult at all     Data saved with a previous flowsheet row definition     Physical Exam Vitals reviewed.  Constitutional:  Appearance: She is well-developed.  Eyes:     Conjunctiva/sclera: Conjunctivae normal.  Cardiovascular:     Rate and Rhythm: Normal rate and regular rhythm.     Pulses: Normal pulses.     Heart sounds: Normal heart sounds.  Pulmonary:     Effort: Pulmonary effort is normal.     Breath sounds: Normal breath sounds. No wheezing, rhonchi or rales.  Skin:    General: Skin is warm and dry.  Neurological:     Mental Status: She is alert.  Psychiatric:        Speech: Speech normal.        Behavior: Behavior normal.        Thought Content: Thought content normal.            [1]  Current Outpatient Medications on File Prior to Visit  Medication Sig Dispense Refill   cholecalciferol (VITAMIN D ) 1000 UNITS tablet Take 1,000 Units by mouth daily.      levothyroxine  (SYNTHROID ) 75 MCG tablet TAKE 1 TABLET (75 MCG TOTAL) BY MOUTH DAILY BEFORE BREAKFAST. 90 tablet 3   Multiple Vitamins-Minerals (CENTRUM SILVER PO) Take by mouth.     rosuvastatin  (CRESTOR ) 5 MG tablet Take 5 mg (1 tablet) by mouth on Monday, Wednesday, and Friday 36 tablet 3   No current facility-administered medications on file prior to visit.

## 2024-07-15 NOTE — Assessment & Plan Note (Signed)
 Chronic, stable.  Continue Crestor  5 mg Monday Wednesday Friday.

## 2024-07-17 ENCOUNTER — Other Ambulatory Visit: Payer: Self-pay | Admitting: Family

## 2024-07-19 NOTE — Assessment & Plan Note (Signed)
 Chronic, stable.  Continue amitriptyline  50 mg nightly.  PHQ score of 1

## 2024-07-19 NOTE — Assessment & Plan Note (Addendum)
 Chronic, stable.  Symptoms controlled with Prilosec 40 mg daily.  Discussed long-term use of Prilosec and advocated for trial decrease to 20 mg.  Upcoming GI consult.  Patient may require EGD due to longstanding GERD

## 2024-07-20 ENCOUNTER — Inpatient Hospital Stay: Admission: RE | Admit: 2024-07-20 | Discharge: 2024-07-20 | Attending: Family

## 2024-07-20 DIAGNOSIS — Z1231 Encounter for screening mammogram for malignant neoplasm of breast: Secondary | ICD-10-CM | POA: Diagnosis present

## 2024-08-27 ENCOUNTER — Other Ambulatory Visit: Payer: Self-pay | Admitting: Family

## 2024-09-01 ENCOUNTER — Encounter: Payer: Self-pay | Admitting: Gastroenterology

## 2024-09-01 ENCOUNTER — Other Ambulatory Visit: Payer: Self-pay | Admitting: Internal Medicine

## 2024-09-01 ENCOUNTER — Ambulatory Visit: Admitting: Certified Registered"

## 2024-09-01 ENCOUNTER — Encounter
Admission: RE | Disposition: A | Discharge status: Home / Self Care | Payer: Self-pay | Source: Home / Self Care | Attending: Gastroenterology

## 2024-09-01 ENCOUNTER — Ambulatory Visit
Admission: RE | Admit: 2024-09-01 | Discharge: 2024-09-01 | Disposition: A | Discharge status: Home / Self Care | Attending: Gastroenterology | Admitting: Gastroenterology

## 2024-09-01 DIAGNOSIS — K219 Gastro-esophageal reflux disease without esophagitis: Secondary | ICD-10-CM | POA: Insufficient documentation

## 2024-09-01 DIAGNOSIS — K2970 Gastritis, unspecified, without bleeding: Secondary | ICD-10-CM | POA: Diagnosis not present

## 2024-09-01 DIAGNOSIS — Z1211 Encounter for screening for malignant neoplasm of colon: Secondary | ICD-10-CM | POA: Diagnosis present

## 2024-09-01 DIAGNOSIS — K3189 Other diseases of stomach and duodenum: Secondary | ICD-10-CM | POA: Insufficient documentation

## 2024-09-01 DIAGNOSIS — K5730 Diverticulosis of large intestine without perforation or abscess without bleeding: Secondary | ICD-10-CM | POA: Diagnosis not present

## 2024-09-01 DIAGNOSIS — K449 Diaphragmatic hernia without obstruction or gangrene: Secondary | ICD-10-CM | POA: Diagnosis not present

## 2024-09-01 DIAGNOSIS — I10 Essential (primary) hypertension: Secondary | ICD-10-CM | POA: Diagnosis not present

## 2024-09-01 DIAGNOSIS — D122 Benign neoplasm of ascending colon: Secondary | ICD-10-CM | POA: Diagnosis not present

## 2024-09-01 DIAGNOSIS — Z79890 Hormone replacement therapy (postmenopausal): Secondary | ICD-10-CM | POA: Insufficient documentation

## 2024-09-01 DIAGNOSIS — E039 Hypothyroidism, unspecified: Secondary | ICD-10-CM | POA: Insufficient documentation

## 2024-09-01 DIAGNOSIS — E782 Mixed hyperlipidemia: Secondary | ICD-10-CM

## 2024-09-01 MED ORDER — PROPOFOL 10 MG/ML IV BOLUS
INTRAVENOUS | Status: DC | PRN
  Administered 2024-09-01: 30 mg via INTRAVENOUS
  Administered 2024-09-01: 70 mg via INTRAVENOUS

## 2024-09-01 MED ORDER — SODIUM CHLORIDE 0.9 % IV SOLN
INTRAVENOUS | Status: DC
  Administered 2024-09-01: 500 mL via INTRAVENOUS

## 2024-09-01 MED ORDER — LIDOCAINE 2% (20 MG/ML) 5 ML SYRINGE
INTRAMUSCULAR | Status: DC | PRN
  Administered 2024-09-01: 20 mg via INTRAVENOUS

## 2024-09-01 MED ORDER — PROPOFOL 500 MG/50ML IV EMUL
INTRAVENOUS | Status: DC | PRN
  Administered 2024-09-01: 120 ug/kg/min via INTRAVENOUS

## 2024-09-01 MED ORDER — GLYCOPYRROLATE 0.2 MG/ML IJ SOLN
INTRAMUSCULAR | Status: DC | PRN
  Administered 2024-09-01: .2 mg via INTRAVENOUS

## 2024-09-01 NOTE — Anesthesia Postprocedure Evaluation (Signed)
"   Anesthesia Post Note  Patient: Erin [name]  Procedure(s) Performed: COLONOSCOPY EGD (ESOPHAGOGASTRODUODENOSCOPY) BIOPSY GI POLYPECTOMY, INTESTINE  Patient location during evaluation: PACU Anesthesia Type: General Level of consciousness: awake and alert Pain management: satisfactory to patient Cardiovascular status: blood pressure returned to baseline Anesthetic complications: no   No notable events documented.   Last Vitals:  Vitals:   09/01/24 0905 09/01/24 0910  BP:  102/73  Pulse: 88 85  Resp: 16 14  Temp:    SpO2: 96% 100%    Last Pain:  Vitals:   09/01/24 0905  TempSrc:   PainSc: 0-No pain                 VAN STAVEREN,[name]      "

## 2024-09-01 NOTE — Op Note (Addendum)
 [facility] Patient Name: Erin [name] Procedure Date: 09/01/2024 8:17 AM MRN: [id number] Account #: [id number] Date of Birth: 02-Feb-1951 Admit Type: Outpatient Age: 74 Room: [facility] ENDO ROOM 2 Gender: Female Note Status: Supervisor Override Instrument Name: Colon Scope [id number] Procedure:             Colonoscopy Indications:           Surveillance: Personal history of colonic polyps                         (unknown histology) on last colonoscopy more than 3                         years ago, Last colonoscopy: May 2018 Providers:             Ruel Kung MD, MD Referring MD:          Rollene MATSU. Arnett (Referring MD) Medicines:             Monitored Anesthesia Care Complications:         No immediate complications. Procedure:             Pre-Anesthesia Assessment:                        - Prior to the procedure, a History and Physical was                         performed, and patient medications, allergies and                         sensitivities were reviewed. The patient's tolerance                         of previous anesthesia was reviewed.                        - The risks and benefits of the procedure and the                         sedation options and risks were discussed with the                         patient. All questions were answered and informed                         consent was obtained.                        - ASA Grade Assessment: II - A patient with mild                         systemic disease.                        After obtaining informed consent, the colonoscope was                         passed under direct vision. Throughout the procedure,  the patient's blood pressure, pulse, and oxygen                         saturations were monitored continuously. The                         Colonoscope was introduced through the anus and                         advanced to the the cecum, identified by the                          appendiceal orifice. The colonoscopy was performed                         with ease. The patient tolerated the procedure well.                         The quality of the bowel preparation was excellent.                         The ileocecal valve, appendiceal orifice, and rectum                         were photographed. Findings:      The perianal and digital rectal examinations were normal.      A 5 mm polyp was found in the ascending colon. The polyp was sessile.       The polyp was removed with a cold snare. Resection and retrieval were       complete.      Multiple diverticula were found in the sigmoid colon.      The exam was otherwise without abnormality on direct and retroflexion       views. Impression:            - One 5 mm polyp in the ascending colon, removed with                         a cold snare. Resected and retrieved.                        - Diverticulosis in the sigmoid colon.                        - The examination was otherwise normal on direct and                         retroflexion views. Recommendation:        - Discharge patient to home (with escort).                        - Resume previous diet.                        - Continue present medications.                        - Await pathology results.                        -  Repeat colonoscopy is not recommended due to current                         age (32 years or older) for surveillance. Procedure Code(s):     --- Professional ---                        [phone], Colonoscopy, flexible; with removal of                         tumor(s), polyp(s), or other lesion(s) by snare                         technique Diagnosis Code(s):     --- Professional ---                        Z12.11, Encounter for screening for malignant neoplasm                         of colon                        D12.2, Benign neoplasm of ascending colon                        K57.30, Diverticulosis of large intestine  without                         perforation or abscess without bleeding CPT copyright 2022 American Medical Association. All rights reserved. The codes documented in this report are preliminary and upon coder review may  be revised to meet current compliance requirements. Ruel Kung, MD Ruel Kung MD, MD 09/01/2024 8:48:08 AM This report has been signed electronically. Number of Addenda: 0 Note Initiated On: 09/01/2024 8:17 AM Scope Withdrawal Time: 0 hours 9 minutes 10 seconds  Total Procedure Duration: 0 hours 13 minutes 6 seconds  Estimated Blood Loss:  Estimated blood loss: none.      [facility]

## 2024-09-01 NOTE — H&P (Signed)
 "  Erin [name] , MD [address], [address], [place], [place], [address] Phone: [phone] Fax: [phone]  Primary Care Physician:  [name], FNP   Pre-Procedure History & Physical: HPI:  Erin [name] is a 74 y.o. female is here for an endoscopy and colonoscopy    Past Medical History:  Diagnosis Date   Cancer [facility]) 2011   thyroid , [name]   Cataract 2021   GERD (gastroesophageal reflux disease) Several yrs   History of basal cell carcinoma    several , arm,left ear, right neck, back ; follows with [name]   Hyperlipidemia    Hx   Hypertension 06/2019   PONV (postoperative nausea and vomiting)    Thyroid  disease Don't know date maybe 2011   Ulcer Don't know    Past Surgical History:  Procedure Laterality Date   CATARACT EXTRACTION W/PHACO Right 11/29/2019   Procedure: CATARACT EXTRACTION PHACO AND INTRAOCULAR LENS PLACEMENT (IOC) RIGHT VIVITY TORIC LENS 8.80 00:45.2 ;  Surgeon: [name], MD;  Location: [facility];  Service: Ophthalmology;  Laterality: Right;   CATARACT EXTRACTION W/PHACO Left 12/20/2019   Procedure: CATARACT EXTRACTION PHACO AND INTRAOCULAR LENS PLACEMENT (IOC) LEFT VIVITY TORIC LENS 7.51  00:43.7;  Surgeon: [name], MD;  Location: [facility];  Service: Ophthalmology;  Laterality: Left;   CHOLECYSTECTOMY  Don't know date   COLONOSCOPY WITH PROPOFOL  N/A 12/23/2016   Procedure: COLONOSCOPY WITH PROPOFOL ;  Surgeon: [name], MD;  Location: [facility] ENDOSCOPY;  Service: Endoscopy;  Laterality: N/A;   EYE SURGERY  2021   GALLBLADDER SURGERY  2000   KNEE SURGERY  2003/ 2010   right knee x2 Dr. Hooten for torn meniscus   SHOULDER SURGERY  1990   right, arthritis   THYROIDECTOMY  2011   right side    Prior to Admission medications  Medication Sig Start Date End Date Taking? Authorizing Provider  amitriptyline  (ELAVIL ) 50 MG tablet TAKE 1 TABLET BY MOUTH EVERYDAY AT BEDTIME 07/15/24   [name], FNP  cholecalciferol (VITAMIN D ) 1000 UNITS tablet Take 1,000 Units by mouth daily.    [provider]  levothyroxine  (SYNTHROID ) 75 MCG tablet TAKE 1 TABLET (75 MCG TOTAL) BY MOUTH DAILY BEFORE BREAKFAST. 09/08/22   [name], MD  Multiple Vitamins-Minerals (CENTRUM SILVER PO) Take by mouth.    [provider]  omeprazole  (PRILOSEC) 20 MG capsule Take 1 capsule (20 mg total) by mouth daily. 07/15/24   [name], FNP  rosuvastatin  (CRESTOR ) 5 MG tablet Take 5 mg (1 tablet) by mouth on Monday, Wednesday, and Friday 10/07/23   [name], MD    Allergies as of 08/17/2024 - Review Complete 07/15/2024  Allergen Reaction Noted   Amoxicillin Hives 04/19/2012   Atorvastatin   11/26/2015   Codeine Nausea Only 04/19/2012    Family History  Problem Relation Age of Onset   Stroke Mother    Heart disease Father    Heart attack Father 14   Cancer Maternal Grandmother        pancreatic   Breast cancer Neg Hx     Social History   Socioeconomic History   Marital status: Single    Spouse name: Not on file   Number of children: Not on file   Years of education: Not on file   Highest education level: 12th grade  Occupational History   Not on file  Tobacco Use   Smoking status: Never  Smokeless tobacco: Never  Vaping Use   Vaping status: Never Used  Substance and Sexual Activity   Alcohol use: Yes    Alcohol/week: 0.0 standard drinks of alcohol    Comment: socially (1x/week)   Drug use: No   Sexual activity: Never    Partners: Female  Other Topics Concern   Not on file  Social History Narrative   Lives in [place].   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       Social Drivers of Health   Tobacco Use: Low Risk (07/15/2024)   Patient History     Smoking Tobacco Use: Never    Smokeless Tobacco Use: Never    Passive Exposure: Not on file  Financial Resource Strain: Low Risk  (08/16/2024)   Received from [facility]   Overall Financial Resource Strain (CARDIA)    Difficulty of Paying Living Expenses: Not hard at all  Food Insecurity: No Food Insecurity (08/16/2024)   Received from [facility]   Epic    Within the past 12 months, you worried that your food would run out before you got the money to buy more.: Never true    Within the past 12 months, the food you bought just didn't last and you didn't have money to get more.: Never true  Transportation Needs: No Transportation Needs (08/16/2024)   Received from [facility] - Transportation    In the past 12 months, has lack of transportation kept you from medical appointments or from getting medications?: No    Lack of Transportation (Non-Medical): No  Physical Activity: Sufficiently Active (04/15/2024)   Exercise Vital Sign    Days of Exercise per Week: 5 days    Minutes of Exercise per Session: 40 min  Stress: No Stress Concern Present (04/15/2024)   [employer] - Occupational Stress Questionnaire    Feeling of Stress: Only a little  Social Connections: Moderately Isolated (04/15/2024)   Social Connection and Isolation Panel    Frequency of Communication with Friends and Family: Twice a week    Frequency of Social Gatherings with Friends and Family: Three times a week    Attends Religious Services: Never    Active Member of Clubs or Organizations: No    Attends Banker Meetings: Never    Marital Status: Living with partner  Intimate Partner Violence: Not At Risk (04/22/2024)   Epic    Fear of Current or Ex-Partner: No    Emotionally Abused: No    Physically Abused: No    Sexually Abused: No  Depression (PHQ2-9): Low Risk (05/10/2024)   Depression (PHQ2-9)    PHQ-2 Score:  1  Alcohol Screen: Low Risk (04/15/2024)   Alcohol Screen    Last Alcohol Screening Score (AUDIT): 1  Housing: Low Risk  (08/16/2024)   Received from [facility]   Epic    In the last 12 months, was there a time when you were not able to pay the mortgage or rent on time?: No    In the past 12 months, how many times have you moved where you were living?: 0    At any time in the past  12 months, were you homeless or living in a shelter (including now)?: No  Utilities: Not At Risk (08/16/2024)   Received from [facility]   Epic    In the past 12 months has the electric, gas, oil, or water company threatened to shut off services in your home?: No  Health Literacy: Adequate Health Literacy (04/22/2024)   B1300 Health Literacy    Frequency of need for help with medical instructions: Never    Review of Systems: See HPI, otherwise negative ROS  Physical Exam: There were no vitals taken for this visit. General:   Alert,  pleasant and cooperative in NAD Head:  Normocephalic and atraumatic. Neck:  Supple; no masses or thyromegaly. Lungs:  Clear throughout to auscultation, normal respiratory effort.    Heart:  +S1, +S2, Regular rate and rhythm, No edema. Abdomen:  Soft, nontender and nondistended. Normal bowel sounds, without guarding, and without rebound.   Neurologic:  Alert and  oriented x4;  grossly normal neurologically.  Impression/Plan: Dene Caldron Stoermer is here for an endoscopy and colonoscopy  to be performed for  evaluation of Barrettes esophagus and colon cancer screening     Risks, benefits, limitations, and alternatives regarding endoscopy have been reviewed with the patient.  Questions have been answered.  All parties agreeable.   Erin Kung, MD  09/01/2024, 7:40 AM [place]  "

## 2024-09-01 NOTE — Transfer of Care (Signed)
 Immediate Anesthesia Transfer of Care Note  Patient: Erin [name]  Procedure(s) Performed: COLONOSCOPY EGD (ESOPHAGOGASTRODUODENOSCOPY) BIOPSY GI POLYPECTOMY, INTESTINE  Patient Location: Endoscopy Unit  Anesthesia Type:General  Level of Consciousness: awake, alert , and oriented  Airway & Oxygen Therapy: Patient Spontanous Breathing  Post-op Assessment: Report given to RN and Post -op Vital signs reviewed and stable  Post vital signs: Reviewed  Last Vitals:  Vitals Value Taken Time  BP 99/73   Temp    Pulse 72 09/01/24 08:50  Resp 17 09/01/24 08:50   99 09/01/24 08:50  Vitals shown include unfiled device data.  Last Pain:  Vitals:   09/01/24 0755  TempSrc: Temporal  PainSc: 0-No pain         Complications: No notable events documented.

## 2024-09-01 NOTE — Anesthesia Preprocedure Evaluation (Signed)
 "                                  Anesthesia Evaluation  Patient identified by MRN, date of birth, ID band Patient awake    Reviewed: Allergy & Precautions, NPO status , Patient's Chart, lab work & pertinent test results  Airway Mallampati: II  TM Distance: >3 FB Neck ROM: Full    Dental  (+) Teeth Intact   Pulmonary neg pulmonary ROS   Pulmonary exam normal        Cardiovascular Exercise Tolerance: Good hypertension, negative cardio ROS Normal cardiovascular exam Rhythm:Regular Rate:Normal     Neuro/Psych  Headaches negative neurological ROS  negative psych ROS   GI/Hepatic negative GI ROS, Neg liver ROS,GERD  Medicated,,  Endo/Other  negative endocrine ROSHypothyroidism    Renal/GU negative Renal ROS  negative genitourinary   Musculoskeletal negative musculoskeletal ROS (+)    Abdominal Normal abdominal exam  (+)   Peds negative pediatric ROS (+)  Hematology negative hematology ROS (+)   Anesthesia Other Findings Past Medical History: 2011: Cancer [facility])     Comment:  thyroid , [name] 2021: Cataract Several yrs: GERD (gastroesophageal reflux disease) No date: History of basal cell carcinoma     Comment:  several , arm,left ear, right neck, back ; follows with               [name] No date: Hyperlipidemia     Comment:  Hx 06/2019: Hypertension No date: PONV (postoperative nausea and vomiting) Don't know date maybe 2011: Thyroid  disease Don't know: Ulcer  Past Surgical History: 11/29/2019: CATARACT EXTRACTION W/PHACO; Right     Comment:  Procedure: CATARACT EXTRACTION PHACO AND INTRAOCULAR               LENS PLACEMENT (IOC) RIGHT VIVITY TORIC LENS 8.80 00:45.2              ;  Surgeon: [name], MD;  Location: [facility];  Service: Ophthalmology;  Laterality:               Right; 12/20/2019: CATARACT EXTRACTION W/PHACO; Left     Comment:  Procedure: CATARACT EXTRACTION PHACO AND INTRAOCULAR                LENS PLACEMENT (IOC) LEFT VIVITY TORIC LENS 7.51                00:43.7;  Surgeon: [name], MD;  Location:               [facility];  Service: Ophthalmology;                Laterality: Left; Don't know date: CHOLECYSTECTOMY 12/23/2016: COLONOSCOPY WITH PROPOFOL ; N/A     Comment:  Procedure: COLONOSCOPY WITH PROPOFOL ;  Surgeon:               [name], MD;  Location: [facility] ENDOSCOPY;                Service: Endoscopy;  Laterality: N/A; 2021: EYE SURGERY 2000: GALLBLADDER SURGERY 2003/ 2010: KNEE SURGERY     Comment:  right knee x2 Dr. Hooten for torn meniscus 1990: SHOULDER SURGERY     Comment:  right, arthritis 2011: THYROIDECTOMY     Comment:  right side  BMI    Body Mass  Index: 22.21 kg/m      Reproductive/Obstetrics negative OB ROS                              Anesthesia Physical Anesthesia Plan  ASA: 2  Anesthesia Plan: General   Post-op Pain Management:    Induction: Intravenous  PONV Risk Score and Plan: Propofol  infusion and TIVA  Airway Management Planned: Natural Airway and Nasal Cannula  Additional Equipment:   Intra-op Plan:   Post-operative Plan:   Informed Consent: I have reviewed the patients History and Physical, chart, labs and discussed the procedure including the risks, benefits and alternatives for the proposed anesthesia with the patient or authorized representative who has indicated his/her understanding and acceptance.     Dental Advisory Given  Plan Discussed with: CRNA  Anesthesia Plan Comments:         Anesthesia Quick Evaluation  "

## 2024-09-01 NOTE — Op Note (Signed)
 [facility] Gastroenterology Patient Name: Erin [name] Procedure Date: 09/01/2024 8:17 AM MRN: [id number] Account #: [id number] Date of Birth: 07/16/1951 Admit Type: Outpatient Age: 74 Room: [facility] ENDO ROOM 2 Gender: Female Note Status: Finalized Instrument Name: Barnie GI Scope [id number] Procedure:             Upper GI endoscopy Indications:           Screening for Barrett's esophagus Providers:             Ruel Kung MD, MD Referring MD:          Rollene MATSU. Arnett (Referring MD) Medicines:             Monitored Anesthesia Care Complications:         No immediate complications. Procedure:             Pre-Anesthesia Assessment:                        - Prior to the procedure, a History and Physical was                         performed, and patient medications, allergies and                         sensitivities were reviewed. The patient's tolerance                         of previous anesthesia was reviewed.                        - The risks and benefits of the procedure and the                         sedation options and risks were discussed with the                         patient. All questions were answered and informed                         consent was obtained.                        - ASA Grade Assessment: II - A patient with mild                         systemic disease.                        After obtaining informed consent, the endoscope was                         passed under direct vision. Throughout the procedure,                         the patient's blood pressure, pulse, and oxygen                         saturations were monitored continuously. The Endoscope  was introduced through the mouth, and advanced to the                         third part of duodenum. The upper GI endoscopy was                         accomplished with ease. The patient tolerated the                         procedure well. Findings:      The  esophagus was normal.      The examined duodenum was normal.      The gastroesophageal flap valve was visualized endoscopically and       classified as Hill Grade III (minimal fold, loose to endoscope, hiatal       hernia likely).      Diffuse moderate inflammation characterized by congestion (edema),       erosions and erythema was found on the greater curvature of the stomach       and in the gastric antrum. Biopsies were taken with a cold forceps for       histology. Impression:            - Normal esophagus.                        - Normal examined duodenum.                        - Gastroesophageal flap valve classified as Hill Grade                         III (minimal fold, loose to endoscope, hiatal hernia                         likely).                        - Gastritis. Biopsied. Recommendation:        - Await pathology results.                        - Perform a colonoscopy today. Procedure Code(s):     --- Professional ---                        [phone], Esophagogastroduodenoscopy, flexible,                         transoral; with biopsy, single or multiple Diagnosis Code(s):     --- Professional ---                        K29.70, Gastritis, unspecified, without bleeding                        Z13.810, Encounter for screening for upper                         gastrointestinal disorder CPT copyright 2022 American Medical Association. All rights reserved. The codes documented in this report are preliminary and upon coder review may  be revised to meet current compliance requirements. Ruel Kung, MD  Ruel Kung MD, MD 09/01/2024 8:31:41 AM This report has been signed electronically. Number of Addenda: 0 Note Initiated On: 09/01/2024 8:17 AM Estimated Blood Loss:  Estimated blood loss: none.      [facility]

## 2024-09-02 LAB — SURGICAL PATHOLOGY

## 2025-01-19 ENCOUNTER — Ambulatory Visit: Admitting: Family

## 2025-04-26 ENCOUNTER — Ambulatory Visit
